# Patient Record
Sex: Female | Born: 1955 | Race: White | Hispanic: No | Smoking: Current every day smoker
Health system: Southern US, Community
[De-identification: ages and names within clinical notes are randomized; demographics above are authoritative.]

## PROBLEM LIST (undated history)

## (undated) DIAGNOSIS — M059 Rheumatoid arthritis with rheumatoid factor, unspecified: Secondary | ICD-10-CM

## (undated) DIAGNOSIS — K2950 Unspecified chronic gastritis without bleeding: Secondary | ICD-10-CM

## (undated) DIAGNOSIS — D75839 Thrombocytosis, unspecified: Secondary | ICD-10-CM

## (undated) DIAGNOSIS — K529 Noninfective gastroenteritis and colitis, unspecified: Secondary | ICD-10-CM

## (undated) DIAGNOSIS — D649 Anemia, unspecified: Secondary | ICD-10-CM

## (undated) DIAGNOSIS — I10 Essential (primary) hypertension: Secondary | ICD-10-CM

## (undated) DIAGNOSIS — E876 Hypokalemia: Secondary | ICD-10-CM

## (undated) DIAGNOSIS — E7800 Pure hypercholesterolemia, unspecified: Secondary | ICD-10-CM

## (undated) DIAGNOSIS — R918 Other nonspecific abnormal finding of lung field: Secondary | ICD-10-CM

## (undated) HISTORY — PX: APPENDECTOMY: SHX54

## (undated) HISTORY — PX: OVARIAN CYST SURGERY: SHX726

## (undated) HISTORY — PX: NASAL SINUS SURGERY: SHX719

## (undated) HISTORY — PX: CERVICAL CONE BIOPSY: SUR198

## (undated) HISTORY — PX: BREAST BIOPSY: SHX20

---

## 2004-07-04 ENCOUNTER — Emergency Department: Payer: Self-pay | Admitting: Emergency Medicine

## 2006-04-30 ENCOUNTER — Emergency Department: Payer: Self-pay | Admitting: Emergency Medicine

## 2006-05-13 ENCOUNTER — Emergency Department: Payer: Self-pay | Admitting: Emergency Medicine

## 2008-09-22 ENCOUNTER — Emergency Department: Payer: Self-pay | Admitting: Emergency Medicine

## 2009-05-27 ENCOUNTER — Ambulatory Visit: Payer: Self-pay | Admitting: Family Medicine

## 2010-08-13 ENCOUNTER — Emergency Department: Payer: Self-pay | Admitting: Emergency Medicine

## 2012-05-11 ENCOUNTER — Inpatient Hospital Stay: Payer: Self-pay | Admitting: Internal Medicine

## 2012-05-11 LAB — WBCS, STOOL

## 2012-05-11 LAB — CBC WITH DIFFERENTIAL/PLATELET
Basophil #: 0.1 10*3/uL (ref 0.0–0.1)
Basophil %: 0.5 %
HGB: 13.6 g/dL (ref 12.0–16.0)
Lymphocyte #: 1.2 10*3/uL (ref 1.0–3.6)
MCH: 32 pg (ref 26.0–34.0)
MCHC: 33.7 g/dL (ref 32.0–36.0)
Monocyte #: 1.2 x10 3/mm — ABNORMAL HIGH (ref 0.2–0.9)
Monocyte %: 5.4 %
Neutrophil %: 88.5 %
WBC: 22.5 10*3/uL — ABNORMAL HIGH (ref 3.6–11.0)

## 2012-05-11 LAB — COMPREHENSIVE METABOLIC PANEL
Bilirubin,Total: 0.3 mg/dL (ref 0.2–1.0)
Calcium, Total: 9.3 mg/dL (ref 8.5–10.1)
Chloride: 106 mmol/L (ref 98–107)
Co2: 27 mmol/L (ref 21–32)
Creatinine: 0.77 mg/dL (ref 0.60–1.30)
Osmolality: 284 (ref 275–301)
Potassium: 3.7 mmol/L (ref 3.5–5.1)
SGOT(AST): 35 U/L (ref 15–37)
SGPT (ALT): 22 U/L (ref 12–78)
Sodium: 143 mmol/L (ref 136–145)

## 2012-05-12 LAB — COMPREHENSIVE METABOLIC PANEL
Albumin: 2.8 g/dL — ABNORMAL LOW (ref 3.4–5.0)
Anion Gap: 5 — ABNORMAL LOW (ref 7–16)
BUN: 8 mg/dL (ref 7–18)
Bilirubin,Total: 0.5 mg/dL (ref 0.2–1.0)
Chloride: 106 mmol/L (ref 98–107)
Co2: 28 mmol/L (ref 21–32)
EGFR (African American): >60
EGFR (Non-African Amer.): >60
Glucose: 98 mg/dL (ref 65–99)
Potassium: 3.6 mmol/L (ref 3.5–5.1)
SGOT(AST): 18 U/L (ref 15–37)
SGPT (ALT): 13 U/L (ref 12–78)
Total Protein: 5.3 g/dL — ABNORMAL LOW (ref 6.4–8.2)

## 2012-05-12 LAB — CBC WITH DIFFERENTIAL/PLATELET
Basophil #: 0 10*3/uL (ref 0.0–0.1)
Eosinophil #: 0.1 10*3/uL (ref 0.0–0.7)
HCT: 30.7 % — ABNORMAL LOW (ref 35.0–47.0)
HGB: 10.3 g/dL — ABNORMAL LOW (ref 12.0–16.0)
Lymphocyte #: 1.8 10*3/uL (ref 1.0–3.6)
Lymphocyte %: 16 %
MCH: 32 pg (ref 26.0–34.0)
MCHC: 33.5 g/dL (ref 32.0–36.0)
MCV: 96 fL (ref 80–100)
Neutrophil #: 8.2 10*3/uL — ABNORMAL HIGH (ref 1.4–6.5)
Neutrophil %: 74.6 %
RBC: 3.21 10*6/uL — ABNORMAL LOW (ref 3.80–5.20)
RDW: 12.5 % (ref 11.5–14.5)

## 2012-05-13 LAB — CBC WITH DIFFERENTIAL/PLATELET
Basophil #: 0.1 10*3/uL (ref 0.0–0.1)
Eosinophil #: 0.2 10*3/uL (ref 0.0–0.7)
HCT: 28.8 % — ABNORMAL LOW (ref 35.0–47.0)
Lymphocyte #: 1.6 10*3/uL (ref 1.0–3.6)
MCH: 31.9 pg (ref 26.0–34.0)
MCHC: 33.4 g/dL (ref 32.0–36.0)
MCV: 96 fL (ref 80–100)
Monocyte #: 0.7 x10 3/mm (ref 0.2–0.9)
Neutrophil #: 8.7 10*3/uL — ABNORMAL HIGH (ref 1.4–6.5)
Platelet: 272 10*3/uL (ref 150–440)
RDW: 12 % (ref 11.5–14.5)
WBC: 11.3 10*3/uL — ABNORMAL HIGH (ref 3.6–11.0)

## 2012-05-14 LAB — CBC WITH DIFFERENTIAL/PLATELET
Basophil %: 0.2 %
Eosinophil #: 0.3 10*3/uL (ref 0.0–0.7)
Eosinophil %: 2.5 %
HCT: 30.3 % — ABNORMAL LOW (ref 35.0–47.0)
HGB: 9.9 g/dL — ABNORMAL LOW (ref 12.0–16.0)
Lymphocyte #: 1.3 10*3/uL (ref 1.0–3.6)
Lymphocyte %: 9.8 %
MCHC: 32.6 g/dL (ref 32.0–36.0)
MCV: 96 fL (ref 80–100)
Monocyte #: 0.8 x10 3/mm (ref 0.2–0.9)
Monocyte %: 6.4 %
Neutrophil %: 81.1 %
RBC: 3.17 10*6/uL — ABNORMAL LOW (ref 3.80–5.20)
WBC: 12.9 10*3/uL — ABNORMAL HIGH (ref 3.6–11.0)

## 2012-05-14 LAB — POTASSIUM: Potassium: 3 mmol/L — ABNORMAL LOW (ref 3.5–5.1)

## 2012-05-15 LAB — CBC WITH DIFFERENTIAL/PLATELET
Basophil #: 0 10*3/uL (ref 0.0–0.1)
Eosinophil %: 1.4 %
HCT: 30.8 % — ABNORMAL LOW (ref 35.0–47.0)
HGB: 10.7 g/dL — ABNORMAL LOW (ref 12.0–16.0)
Lymphocyte %: 6.2 %
MCH: 33.4 pg (ref 26.0–34.0)
Monocyte %: 6.5 %
Neutrophil #: 10.3 10*3/uL — ABNORMAL HIGH (ref 1.4–6.5)
RBC: 3.21 10*6/uL — ABNORMAL LOW (ref 3.80–5.20)
RDW: 12.2 % (ref 11.5–14.5)
WBC: 12 10*3/uL — ABNORMAL HIGH (ref 3.6–11.0)

## 2012-05-15 LAB — POTASSIUM: Potassium: 3.1 mmol/L — ABNORMAL LOW (ref 3.5–5.1)

## 2012-05-15 LAB — MAGNESIUM: Magnesium: 1.8 mg/dL

## 2012-05-16 LAB — CULTURE, BLOOD (SINGLE)

## 2012-05-16 LAB — PATHOLOGY REPORT

## 2012-06-03 ENCOUNTER — Ambulatory Visit: Payer: Self-pay | Admitting: Gastroenterology

## 2012-08-14 ENCOUNTER — Ambulatory Visit: Payer: Self-pay | Admitting: Gastroenterology

## 2014-01-06 ENCOUNTER — Inpatient Hospital Stay: Payer: Self-pay | Admitting: Internal Medicine

## 2014-01-06 LAB — CBC
HCT: 41.1 % (ref 35.0–47.0)
HGB: 13.8 g/dL (ref 12.0–16.0)
MCH: 31.9 pg (ref 26.0–34.0)
MCHC: 33.6 g/dL (ref 32.0–36.0)
MCV: 95 fL (ref 80–100)
Platelet: 423 10*3/uL (ref 150–440)
RBC: 4.33 10*6/uL (ref 3.80–5.20)
RDW: 12.8 % (ref 11.5–14.5)
WBC: 19.5 10*3/uL — ABNORMAL HIGH (ref 3.6–11.0)

## 2014-01-06 LAB — COMPREHENSIVE METABOLIC PANEL
ANION GAP: 7 (ref 7–16)
Albumin: 3.7 g/dL (ref 3.4–5.0)
Alkaline Phosphatase: 68 U/L
BUN: 15 mg/dL (ref 7–18)
Bilirubin,Total: 0.2 mg/dL (ref 0.2–1.0)
Calcium, Total: 8.7 mg/dL (ref 8.5–10.1)
Chloride: 108 mmol/L — ABNORMAL HIGH (ref 98–107)
Co2: 25 mmol/L (ref 21–32)
Creatinine: 0.87 mg/dL (ref 0.60–1.30)
EGFR (African American): >60
EGFR (Non-African Amer.): >60
GLUCOSE: 99 mg/dL (ref 65–99)
Osmolality: 280 (ref 275–301)
POTASSIUM: 3.8 mmol/L (ref 3.5–5.1)
SGOT(AST): 28 U/L (ref 15–37)
SGPT (ALT): 19 U/L (ref 12–78)
SODIUM: 140 mmol/L (ref 136–145)
TOTAL PROTEIN: 7.4 g/dL (ref 6.4–8.2)

## 2014-01-06 LAB — URINALYSIS, COMPLETE
BACTERIA: NONE SEEN
BLOOD: NEGATIVE
Bilirubin,UR: NEGATIVE
Glucose,UR: NEGATIVE mg/dL (ref 0–75)
Ketone: NEGATIVE
Nitrite: NEGATIVE
Ph: 5 (ref 4.5–8.0)
Protein: NEGATIVE
RBC,UR: 1 /HPF (ref 0–5)
SPECIFIC GRAVITY: 1.017 (ref 1.003–1.030)
Squamous Epithelial: 1
WBC UR: 2 /HPF (ref 0–5)

## 2014-01-06 LAB — LIPASE, BLOOD: LIPASE: 90 U/L (ref 73–393)

## 2014-01-07 LAB — BASIC METABOLIC PANEL
Anion Gap: 6 — ABNORMAL LOW (ref 7–16)
BUN: 11 mg/dL (ref 7–18)
CHLORIDE: 107 mmol/L (ref 98–107)
CO2: 26 mmol/L (ref 21–32)
Calcium, Total: 7.6 mg/dL — ABNORMAL LOW (ref 8.5–10.1)
Creatinine: 0.72 mg/dL (ref 0.60–1.30)
EGFR (African American): >60
Glucose: 87 mg/dL (ref 65–99)
Osmolality: 276 (ref 275–301)
POTASSIUM: 3.2 mmol/L — AB (ref 3.5–5.1)
Sodium: 139 mmol/L (ref 136–145)

## 2014-01-07 LAB — CBC WITH DIFFERENTIAL/PLATELET
Basophil #: 0 10*3/uL (ref 0.0–0.1)
Basophil %: 0.3 %
Eosinophil #: 0.2 10*3/uL (ref 0.0–0.7)
Eosinophil %: 2.1 %
HCT: 34.2 % — ABNORMAL LOW (ref 35.0–47.0)
HGB: 11.2 g/dL — ABNORMAL LOW (ref 12.0–16.0)
Lymphocyte #: 1.6 10*3/uL (ref 1.0–3.6)
Lymphocyte %: 15.5 %
MCH: 31.4 pg (ref 26.0–34.0)
MCHC: 32.8 g/dL (ref 32.0–36.0)
MCV: 96 fL (ref 80–100)
MONO ABS: 0.8 x10 3/mm (ref 0.2–0.9)
MONOS PCT: 8.2 %
NEUTROS ABS: 7.5 10*3/uL — AB (ref 1.4–6.5)
Neutrophil %: 73.9 %
PLATELETS: 300 10*3/uL (ref 150–440)
RBC: 3.57 10*6/uL — AB (ref 3.80–5.20)
RDW: 12.4 % (ref 11.5–14.5)
WBC: 10.2 10*3/uL (ref 3.6–11.0)

## 2014-03-16 DIAGNOSIS — Z720 Tobacco use: Secondary | ICD-10-CM | POA: Insufficient documentation

## 2014-10-12 NOTE — Consult Note (Signed)
Pt seen and examined. Full consult to follow. Acute onset of abd pain assoc with rectal bleeding. CT suggestive of ischemic colitis. No prior colonoscopies. Feeling better though abd still tender. Agree with liquid diet for now. Advance gradually as tolerated. Agree with Abx for now. If ischemic, expect sxs to gradually resolve. [facility] will [facility]. If sxs resolve, then will need outpt colonoscopy in 1 month with me as outpt. If sxs do not improve, then colonscopy while she is here in hospital. Thanks.  Electronic Signatures: Lutricia Feilh, Caspian Deleonardis (MD)  (Signed on [phone]-Nov-13 07:28)  Authored  Last Updated: 13-YQM-57: 18-Nov-13 07:28 by Lutricia Feilh, Jameika Kinn (MD)

## 2014-10-12 NOTE — H&P (Signed)
Subjective/Chief Complaint lower abd pain    History of Present Illness acute onset LQ abd pain, started at 0130 some dark color, no BRB nausea, no emesis near syncopal, sweaty, vagal? 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    + Tobacco Current (within 1 year)    Place of Living Home   Review of Systems:   Fever/Chills No    Cough No    Abdominal Pain Yes    Diarrhea Yes    Constipation No    Nausea/Vomiting Yes    SOB/DOE No    Chest Pain No    Dysuria No    Tolerating Diet Yes  Nauseated  hungry   Physical Exam:   GEN no acute distress    HEENT pink conjunctivae    NECK supple    RESP normal resp effort  clear BS    CARD regular rate    ABD positive tenderness  soft  no peritoneal signs    EXTR negative edema    SKIN normal to palpation    PSYCH alert, A+O to time, place, person, good insight   Lab Results: Hepatic:  17-Nov-13 05:43    Bilirubin, Total 0.3   Alkaline Phosphatase 85   SGPT (ALT) 22   SGOT (AST) 35   Total Protein, Serum 7.7   Albumin, Serum 4.4  Routine Micro:  17-Nov-13 05:43    Micro Text Report CLOS.DIFF ASSAY, RT-PCR   COMMENT                   NEGATIVE-CLOS.DIFFICILE TOXIN NOT DETECTED BY PCR   ANTIBIOTIC                        Clostridium Diff Toxin by RT-PCR NEGATIVE-CLOS.DIFFICILE TOXIN NOT DETECTED BY PCR ---------------------------------- Test procedure integrates sample purification, nucleic acid amplification, and detection of the target Clostridium difficile sequence in simple or complex samplesusing real-time PCR and RT-PCR assays.  Routine Chem:  17-Nov-13 05:43    Glucose, Serum  105   BUN 10   Creatinine (comp)  0.77   Sodium, Serum 143   Potassium, Serum 3.7   Chloride, Serum 106   CO2, Serum 27   Calcium (Total), Serum 9.3   Osmolality (calc) 284   eGFR (African American) >60   eGFR (Non-African American) >60 (eGFR values <58m/min/1.73 m2 may be an indication of chronic kidney disease (CKD). Calculated eGFR is useful in patients with stable renal function. The eGFR calculation will not be reliable in acutely ill patients when serum creatinine is changing rapidly. It is not useful in  patients on dialysis. The eGFR calculation may not be applicable to patients at the low and high extremes of body sizes, pregnant women, and vegetarians.)   Anion Gap 10  Routine Hem:  17-Nov-13 05:43    WBC (CBC)  22.5   RBC (CBC) 4.25   Hemoglobin (CBC) 13.6   Hematocrit (CBC) 40.4   Platelet Count (CBC)  493   MCV 95   MCH 32.0   MCHC 33.7   RDW 12.7   Neutrophil % 88.5   Lymphocyte % 5.4   Monocyte % 5.4  Eosinophil % 0.2   Basophil % 0.5   Neutrophil #  19.9   Lymphocyte # 1.2   Monocyte #  1.2   Eosinophil # 0.0   Basophil # 0.1 (Result(s) reported on 11 May 2012 at 06:13AM.)   Radiology Results: CT:    17-Nov-13 08:22, CT Abdomen and Pelvis With Contrast   CT Abdomen and Pelvis With Contrast   REASON FOR EXAM:    (1) LLQ pain; (2) LLQ pain  COMMENTS:       PROCEDURE: CT  - CT ABDOMEN / PELVIS  W  - May 11 2012  8:22AM     RESULT: History: Left lower quadrant pain    Comparison:  None    Technique: Multiple axial images of the abdomen and pelvis were performed   from the lung bases to the pubic symphysis, without p.o. contrast and   with 100 ml of Isovue 370 intravenous contrast.    Findings:  The lung bases are clear. There is no pneumothorax. The heart size is   normal.     The liver demonstrates no focal abnormality. There is no intrahepatic or   extrahepatic biliary ductal dilatation. The gallbladder is unremarkable.   The spleen demonstrates no focal abnormality. The  kidneys, adrenal   glands, and pancreas are normal. The bladder is unremarkable.     There is wall thickening involving the distal transverse colon and   descending colon with pericolonic inflammatory changes most concerning   for colitis which may be secondary to an infectious versus inflammatory   versus ischemic etiology. There is no pneumoperitoneum, pneumatosis, or   portal venous gas. There is no abdominal or pelvic free fluid. There is   no lymphadenopathy.   The abdominal aorta is normal in caliber. Knee superior mesenteric artery   is patent. The celiac artery is patent. The superior mesenteric vein is   patent.    The osseous structures are unremarkable.    IMPRESSION:     1. There is wall thickening involving the distal transverse colon and   descending colon with pericolonic inflammatory changes most concerning   for colitis which may be secondary to an infectious versus inflammatory   versus ischemic etiology.    Dictation Site: 1    Verified By: Jennette Banker, M.D., MD     Assessment/Admission Diagnosis colitis of unclear etiology admit hydrate will ask GI to see pt in am   Electronic Signatures: Florene Glen (MD)  (Signed [phone] 09:57)  Authored: CHIEF COMPLAINT and HISTORY, PAST MEDICAL/SURGIAL HISTORY, ALLERGIES, FAMILY AND SOCIAL HISTORY, REVIEW OF SYSTEMS, PHYSICAL EXAM, LABS, Radiology, ASSESSMENT AND PLAN   Last Updated: 17-Nov-13 09:57 by Florene Glen (MD)

## 2014-10-12 NOTE — Consult Note (Signed)
Overall feeling better. Pt with sigmoid diverticulosis, pedunculated sigmoid polyp that was removed, and signif ischemic colitis affecting descending and transverse colon. Multiple bx's taken. Full liquid diet and advance as tolerated.  Finish course of Abx. Thanks   Electronic Signatures: Lutricia Feilh, Sayer Masini (MD)  (Signed on 21-Nov-13 14:58)  Authored  Last Updated: 21-Nov-13 14:58 by Lutricia Feilh, Kenric Ginger (MD)

## 2014-10-12 NOTE — Consult Note (Signed)
PATIENT NAME:  Becky Ward, Becky Ward MR#:  [id number] DATE OF BIRTH:  04-30-1956  DATE OF CONSULTATION:  05/12/2012  REFERRING PHYSICIAN:   CONSULTING PHYSICIAN:  Ezzard StandingPaul Y. Bluford Kaufmannh, MD  REASON FOR REFERRAL: Acute colitis.   HISTORY OF PRESENT ILLNESS: The patient is a 59 year old white female with a known history of hypertension and tobacco use who woke up from sleep with acute lower abdominal pain followed by bouts of nausea and vomiting and then bloody diarrhea. In the emergency room, her white count was 22,000, she was afebrile. Decision was made to admit the patient with IV fluids and surgical consultation. The patient had something similar to this several months ago, but it was much less severe in nature. Other than that, her bowel movements are quite normal. She denied any prior history of rectal bleeding.   PAST MEDICAL HISTORY:  1. Hypertension. 2. Tobacco and alcohol use.  3. Appendectomy.  4. Vaginal cyst removal.   SOCIAL HISTORY: She smokes a pack a day and drinks four beers a day.   FAMILY HISTORY: History is notable for heart disease and leukemia.   ALLERGIES: She is allergic to codeine.   REVIEW OF SYSTEMS: No changes from the admission. Please refer to that.  HOME MEDICATIONS: Lisinopril.   PHYSICAL EXAMINATION:   GENERAL: The patient appears to be in no acute distress right now.   VITAL SIGNS: She is afebrile at this point. Her vital signs are normal at 109/69, although she was hypotensive when she was admitted.   HEENT: Normocephalic, atraumatic head. Pupils are equally reactive. Throat was clear.   NECK: Supple.   CARDIAC: Regular rhythm and rate without murmurs.   PULMONARY: Lungs are clear bilaterally.   ABDOMEN: Normoactive bowel sounds, soft. There is definite tenderness in the lower abdomen to palpation. There is no rebound or guarding. There is no hepatomegaly. She has active bowel sounds.   EXTREMITIES: No clubbing, cyanosis, or edema.   SKIN: Normal.    NEUROLOGICAL: Nonfocal.   RESULTS: CT scan showed thickened bowel wall in the distal transverse colon and descending colon with some inflammatory changes.   ASSESSMENT AND PLAN: This is a patient with acute colitis. It is likely to be ischemic in nature, especially with her tobacco use.  I agree with antibiotic coverage for now. She should be kept on a liquid diet for now until her cramping improves. If this is ischemic colitis, the symptoms should resolve gradually over the next few days. Advance the diet gradually as tolerated. The patient never had a colonoscopy in the past. The patient should have an outpatient colonoscopy in one month with me as an outpatient if symptoms resolve over the next few days. However, if they do not, then she will need a colonoscopy while she is here in the hospital. [facility], but will check back on the patient on Wednesday morning. Thank you for the referral. ____________________________ Ezzard StandingPaul Y. Bluford Kaufmannh, MD pyo:slb D: 05/12/2012 08:00:46 ET     T: 05/12/2012 08:25:11 ET        JOB#: [id number] cc: Ezzard StandingPaul Y. Bluford Kaufmannh, MD, <Dictator> Ezzard StandingPAUL Y Braeleigh Pyper MD ELECTRONICALLY SIGNED 05/12/2012 9:50

## 2014-10-12 NOTE — H&P (Signed)
PATIENT NAME:  Becky Ward, Becky Ward MR#:  [id number] DATE OF BIRTH:  April 23, 1956  DATE OF ADMISSION:  05/11/2012  CHIEF COMPLAINT: Lower abdominal pain.   HISTORY OF PRESENT ILLNESS: This is a patient with acute onset of lower abdominal pain started at 01:30 hours this morning. She has had some dark color to her loose stools but no frank bright red blood per rectum. She has never had an episode like this before. She denies fevers or chills but did have considerable sweatiness and a near syncopal episode suggesting a possible vagal reaction. Her pain is better now than it was but is still present. She was nauseated but has not had any vomiting.   PAST MEDICAL HISTORY: Hypertension.   PAST SURGICAL HISTORY: GYN surgery only.   SOCIAL HISTORY: Patient smokes tobacco, drinks alcohol on occasion. 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   REVIEW OF SYSTEMS: 10 system review is performed and negative with the exception of that mentioned in the history of present illness.   MEDICATIONS: Hydrochlorothiazide with lisinopril.   ALLERGIES: Codeine.   PHYSICAL EXAMINATION:  GENERAL: Healthy, comfortable-appearing Caucasian female patient in no acute distress.   VITAL SIGNS: Temperature 97.5, pulse 74, respirations 16, blood pressure 138/86, her blood pressure was as low as 76 on admission to the Emergency Room. Pain scale of 3. 98% room air sat. BMI of 21.   HEENT: No scleral icterus.   NECK: No palpable neck nodes.   CHEST: Clear to auscultation.   CARDIAC: Regular rate and rhythm.   ABDOMEN: Soft, nondistended. No guarding. No rebound. No percussion tenderness. Minimal tenderness in the left lower quadrant mostly. Pfannenstiel scar is well healed.   EXTREMITIES: Without are edema. Calves are nontender.   NEUROLOGIC: Grossly intact.   INTEGUMENT: No jaundice.   LABORATORY, DIAGNOSTIC AND RADIOLOGICAL DATA: Laboratory values demonstrate a white blood cell count of 22,000, hemoglobin and hematocrit of 13.6 and  40. Electrolytes are within normal limits. She is not acidotic with a CO2 of 27. CT scan is personally reviewed showing left-sided colitis with thickening.   Lactic acid is 1.1.   ASSESSMENT AND PLAN: This is a patient with probable colitis of unclear etiology. Her C. difficile toxin titer was negative but she had some dark color to her diarrhea. The etiology of this is not clear. I have recommended admission to the hospital, starting antibiotics. She has already received a dose of ciprofloxacin. I will start Levaquin daily and ask GI to see her. [facility] has been consulted and I have spoken directly to them but we will be the admitting physicians of record and they will follow this patient's course. Patient and family were in agreement with this plan.   ____________________________ Adah Salvageichard E. Excell Seltzerooper, MD rec:cms D: 05/11/2012 10:04:04 ET T: 05/11/2012 11:49:30 ET JOB#: [id number]  cc: Adah Salvageichard E. Excell Seltzerooper, MD, <Dictator> Lattie HawICHARD E Aarsh Fristoe MD ELECTRONICALLY SIGNED 05/11/2012 12:24

## 2014-10-12 NOTE — Discharge Summary (Signed)
PATIENT NAME:  Becky Ward, Becky Ward MR#:  [id number] DATE OF BIRTH:  1955/12/27  DATE OF ADMISSION:  05/11/2012 DATE OF DISCHARGE:  05/15/2012  ADMITTING DIAGNOSIS: Acute colitis.  DISCHARGE DIAGNOSES: 1. Acute suspected ischemic colitis status post colonoscopy on 05/15/2012 by Dr. Bluford Kaufmannh revealing diverticulosis of sigmoid colon, one medium polyp in sigmoid colon resected and retrieved, localized moderate inflammation was found in descending colon secondary to ischemic colitis, status post biopsy. Biopsy results are pending.  2. Lower gastrointestinal bleed due to colitis.  3. Acute posthemorrhagic anemia.  4. Hypotension due to gastrointestinal bleed.  5. Hypokalemia/hypomagnesemia.  DISCHARGE CONDITION: Stable.   DISCHARGE MEDICATIONS:  1. Flagyl 500 mg p.o. three times daily for 10 days - new medication. 2. Acetaminophen/oxycodone 325/5 mg 1 tablet every four hours as needed.  3. Levofloxacin 500 mg p.o. daily for 10 days.  4. Nicotine oral inhaler one every two hours as needed.  5. NicoDerm C-Q 14 mg/24 hour transdermal film, one patch transdermally once a day.   NOTE: The patient is not to take hydrochlorothiazide or lisinopril pill as well as Goody's headache powders.   DIET: Low residue, mechanical soft.  ACTIVITY: As tolerated.   DISCHARGE FOLLOWUP: Follow-up with Dr. Bluford Kaufmannh in one week after discharge and Dr. Lacie ScottsNiemeyer in two days after discharge.   CONSULTANTS:  1. Lutricia FeilPaul Oh, MD. 2. Ida Roguehristopher Lundquist, MD. 3. Dionne Miloichard Cooper, MD  RADIOLOGIC STUDIES: CT scan of the abdomen and pelvis with contrast, 05/11/2012, showed wall thickening involving distal transverse colon and descending colon with pericolonic inflammatory changes most concerning for colitis which may be secondary to infectious versus inflammatory versus ischemic etiology.  KUB done on 05/13/2012 showed nonobstructive bowel gas pattern.  HISTORY/HOSPITAL COURSE: The patient is a 59 year old Caucasian female with past  medical history significant for history of hypertension who presented to the hospital with complaints of abdominal pain, nausea, and vomiting as well as bloody diarrhea. Please refer to Dr. Wardell HeathSrikar Sudini's admission note on 05/11/2012. Apparently the patient was awakened with acute onset of lower abdominal pain which woke her up from sleep. She had multiple episodes of vomiting, unable to keep anything down.   On arrival to the Emergency Room, she had bloody diarrhea. She was noted to have leukocytosis to 2000. She was afebrile. She also had a presyncopal episode and was hypotensive in the Emergency Room. After 2 liters of IV normal saline solution, she is a little bit more comfortable.  Temperature was 97.5, pulse 71, and blood pressure 76/55; later after IV fluids, 130/90s. Saturation was 100% on room air. Physical exam revealed diffuse abdominal discomfort and tenderness in lower abdomen with no rigidity or guarding noted. No hepatosplenomegaly was also noted.   On arrival to the Emergency Room, 05/11/2012, laboratory data showed an elevated glucose to 105, otherwise BMP was unremarkable. The patient's liver enzymes were normal. The patient's white blood cell count was 22.5 thousand, hemoglobin 13.6, platelet count 493, and absolute neutrophil count was elevated to 19.9.  Blood cultures x2 taken on 05/11/2012 showed no growth. The patient's stool cultures taken the same day, 05/11/2012, showed no growth and no pathogens were noted, no white blood cells or red blood cells in stool, and was negative for C. difficile PCR.   The patient was admitted to the hospital. She had a CT scan of her abdomen done which revealed colitis. She was started on Levaquin as well as Flagyl IV as well as pain medications, IV fluids, and symptomatic therapy. With this therapy, she improved.  However, her improvement was somewhat slow. For this reason she underwent colonoscopy by Dr. Bluford Kaufmann which revealed diverticulosis as well as colon  polyps and ischemic mucosa. Dr. Bluford Kaufmann recommended to discharge the patient home and follow up with him in the next one week after discharge. The patient will be getting soft, low residue diet. If she tolerates this diet well, she is going to be discharged home.  On the day of discharge, her temperature is 98.2, pulse 69, respiratory rate 18, blood pressure 122/74, and saturation was 92% on 2 liters of oxygen through nasal cannula at rest. The patient will not be restarting her blood pressure medications at this point due to hypotensive episode in the hospital. The patient was noted to be anemic with rehydration and hemoglobin was found to be 10.7, on 05/15/2012. Anemia did not require transfusion.  The patient's white blood cell count improved to 12.0, on the day of discharge, 05/15/2012.  It is recommended to follow the patient's white blood cell count as an outpatient to make sure it normalizes. The patient was also noted to be hypokalemic as well as hypomagnesemic. Those elements were supplemented IV as well as p.o. It is recommended to recheck them as outpatient. The patient is being discharged in stable condition with the above-mentioned medications and follow-up.   TIME SPENT: 40 minutes. ____________________________ Katharina Caper, MD rv:slb D: 05/15/2012 17:28:22 ET T: 05/16/2012 12:05:49 ET JOB#: [id number]  cc: Katharina Caper, MD, <Dictator> Meindert A. Lacie Scotts, MD Katharina Caper MD ELECTRONICALLY SIGNED 05/23/2012 13:13

## 2014-10-12 NOTE — Consult Note (Signed)
Chief Complaint:   Subjective/Chief Complaint Still with abd cramping and diarrhea. No bleeding.   VITAL SIGNS/ANCILLARY NOTES: **Vital Signs.:   20-Nov-13 14:25   Vital Signs Type Routine   Temperature Temperature (F) 98.5   Celsius 36.9   Temperature Source Oral   Pulse Pulse 74   Respirations Respirations 20   Systolic BP Systolic BP 121   Diastolic BP (mmHg) Diastolic BP (mmHg) 80   Mean BP 93   Pulse Ox % Pulse Ox % 92   Pulse Ox Activity Level  At rest   Oxygen Delivery Room Air/ 21 %   Brief Assessment:   Cardiac Regular    Respiratory clear BS    Gastrointestinal LLQ abd tenderness   Assessment/Plan:  Assessment/Plan:   Assessment Colitis, prob ischemic. Discussed proceeding with colonoscopy for bx tomorrow. Pt agreed.    Plan Bowel prep tonight for colonoscopy tomorrow afternoon. Make sure K stays above 3.0. Thanks.   Electronic Signatures: Lutricia Feilh, Trinh Sanjose (MD)  (Signed [phone]-Nov-13 15:15)  Authored: Chief Complaint, VITAL SIGNS/ANCILLARY NOTES, Brief Assessment, Assessment/Plan   Last Updated: 20-Nov-13 15:15 by Lutricia Feilh, Syris Brookens (MD)

## 2014-10-12 NOTE — H&P (Signed)
PATIENT NAME:  Becky Ward, Becky Ward MR#:  [id number] DATE OF BIRTH:  July 19, 1955  DATE OF ADMISSION:  05/11/2012  PRIMARY CARE PHYSICIAN: Dr. Lacie Scotts   Consulting Physician - Dr. Excell Seltzer  Reason for consult -  Colitis, Sepsis  History obtained from patient and her brother at bedside. Old records reviewed. Case discussed with Dr. Clemens Catholic. CT scan and EKG reviewed personally.   CHIEF COMPLAINT: Abdominal pain, vomiting, and bloody diarrhea.   HISTORY OF PRESENTING ILLNESS:  59 year old female patient with history of hypertension and tobacco abuse who presented to the Emergency Room with acute onset of lower abdominal pain which woke her up from sleep. The patient had multiple episodes of vomiting, unable to keep anything down. On arrival to the Emergency Room here she had bloody diarrhea. The patient never had similar symptoms in the past. Leukocytosis of 22,000. Afebrile. The patient on the toilet had a presyncopal episode and was hypotensive on arrival to the Emergency Room, had 2 liters of IV normal saline bolus and is feeling significantly better. Dr. Excell Seltzer of surgery has been consulted by the ER who will see the patient.   The patient's pain is nonradiating, no aggravating or relieving factors. Associated with vomiting, bloody diarrhea. It is sharp and cramping.   PAST MEDICAL HISTORY:  1. Hypertension.  2. Tobacco abuse. 3. Alcohol abuse with four beers a day. The patient mentions that she did not drink alcohol for a few days one week prior and had no withdrawal symptoms.  4. Appendectomy.  5. Vaginal cyst removal.   SOCIAL HISTORY: Drinks four beers a day, smokes a pack a day. No illicit drugs.  Lives at home alone.   CODE STATUS: FULL CODE.   FAMILY HISTORY: Mother died of myocardial infarction, of  what patient mentions was a rupture of her heart. Dad died of leukemia. No premature coronary artery disease in the family.   ALLERGIES: Codeine, which causes drowsiness.   REVIEW OF  SYSTEMS: CONSTITUTIONAL: No fever. Complains of fatigue, weakness, and pain. EYES: No blurred vision, pain, or redness. ENT: No tinnitus, ear pain, or hearing loss. RESPIRATORY: No cough, wheeze, hemoptysis, dyspnea, or asthma. CARDIOVASCULAR: No chest pain, orthopnea, or edema. GI: Complains of nausea, vomiting, diarrhea, and rectal bleeding. No melena. GU: No dysuria, hematuria, or frequency. ENDOCRINE: No polyuria, nocturia, or thyroid problems. HEMATOLOGIC/LYMPHATIC: No anemia or easy bruising, but has rectal bleeding. INTEGUMENT: No acne, rash, or lesions. MUSCULOSKELETAL: No neck, back, shoulder, or knee pain. No arthritis. No gout. NEUROLOGIC: No focal numbness or weakness. No dysarthria or epilepsy.  PSYCH: No anxiety or depression.   HOME MEDICATIONS: Lisinopril/ hydrochlorothiazide 20/12.5, 1 tablet oral daily.   PHYSICAL EXAMINATION:  VITAL SIGNS: Temperature 97.5, blood pressure 76/55, presently at 130/87, pulse 71, respirations 16, saturating 100% on room air.   GENERAL: Moderately built Caucasian female patient lying in bed in significant pain, conversational.   PSYCHIATRIC: Alert, oriented times three. Mood and affect appropriate. Judgment intact.   HEENT: Atraumatic, normocephalic. Oral mucosa dry and pink. No oral ulcers or thrush. External ears and nose normal. No pallor. No icterus. Pupils bilaterally equal and reactive to light.   NECK: Supple. No thyromegaly. No palpable lymph nodes. Trachea midline. No carotid bruits or JVD.   CARDIOVASCULAR: S1, S2, regular rate and rhythm without any murmurs. Peripheral pulses 2+. No edema.   RESPIRATORY: Normal work of breathing. Clear to auscultation on both sides.   GI: Soft abdomen. Tenderness in the lower abdomen. No rigidity or guarding. Bowel sounds  present. No hepatosplenomegaly palpable.   SKIN: Warm and dry. No petechiae, rash, or ulcers.   MUSCULOSKELETAL: No joint swelling, redness, or effusion of the large joints. Normal  muscle tone.   LYMPHATIC: No cervical or inguinal lymphadenopathy.   NEUROLOGICAL: Motor strength 5/5 in upper and lower extremities. Sensation to fine touch intact all over.  LABORATORY, DIAGNOSTIC, AND RADIOLOGICAL DATA: Laboratory studies show glucose 105, BUN 10, creatinine 0.77, sodium 143, potassium 3.7. AST, ALT, alkaline phosphatase, bilirubin, and albumin normal. WBC 22.5, hemoglobin 13.6, platelets of 493, neutrophils 88% without any bands C. difficile negative.   EKG shows normal sinus rhythm. No acute ST elevation.   CT scan of the abdomen shows wall thickening involving the distal transverse colon and descending colon with pericolonic inflammatory changes concerning for colitis, infectious versus inflammatory versus ischemic etiology.   ASSESSMENT AND PLAN:  1. Acute colitis: Presumed to be infectious at this point with leukocytosis and hypotension. Cannot rule out ischemic colitis. Start on antibiotics and IV fluids. Monitor for anemia as the patient had some bloody diarrhea. We will consult gastroenterology and surgery for further input. The patient does have risk factors of hypertension and smoking for ischemic colitis. Blood cultures, stool cultures.  2. Sepsis: Secondary to above.  3. Tobacco abuse: The patient was counseled for more than three minutes regarding quitting smoking. I have also explained her family risk factors, hypertension. Offered a nicotine patch.  4. Alcohol abuse: The patient is high risk for withdrawal. The patient did not have any withdrawal last week when she did not drink as per the patient. We will have Ativan p.r.n. Start her on thiamine, folic acid, and closely monitor.  5. Hypertension: Well controlled hold medications at this time secondary to hypotension.  6. Deep vein thrombosis prophylaxis: With SCDs. No heparin products secondary to bloody diarrhea.   CODE STATUS: FULL CODE.   TIME SPENT: Time spent today on this case was 60 minutes with more  than 50% of the time spent in coordination of care.  ____________________________ Molinda BailiffSrikar R. Makaya Juneau, MD srs:bjt D: 05/11/2012 09:36:35 ET T: 05/11/2012 11:59:07 ET JOB#: [id number]  cc: Wardell HeathSrikar R. Tenishia Ekman, MD, <Dictator> Meindert A. Lacie ScottsNiemeyer, MD Adah Salvageichard E. Excell Seltzerooper, MD Wardell HeathSRIKAR [place] Bali Amylah Will MD ELECTRONICALLY SIGNED 05/11/2012 13:45

## 2014-10-16 NOTE — H&P (Signed)
PATIENT NAME:  Becky Ward, Becky Ward MR#:  [id number] DATE OF BIRTH:  1955-11-09  DATE OF ADMISSION:  01/06/2014  PRIMARY CARE PHYSICIAN: Dr. Lacie Scotts   REFERRING EMERGENCY ROOM PHYSICIAN: Dr. Janalyn Harder   CHIEF COMPLAINT: Abdominal cramp and nausea.   HISTORY OF PRESENTING ILLNESS: This is a 59 year old female with past history of hypertension, smoking, alcoholism, and had colitis 1-1/2 years ago. Came to Emergency Room as she had started having cramping abdominal pain, which is on and off since today morning. She also felt nauseous with that and she felt excessive sweating, felt dizzy, and 1 episode of almost sitting on the floor with that dizziness and had to stay on the floor for a few minutes before she could actually get up or call somebody so decided to come to Emergency Room.  At home, she did not have any episode of diarrhea, but once she came to the Emergency Room, she had a CAT scan with contrast to find out the reason and it showed there is colitis. After drinking the dye for the contrast and after having CAT scan, she had 4-5 times loose stool. She does not have any blood in her stool and she did not vomit at any time actually. Denies any fever. She has colonoscopy done 1-1/2 years ago with that previous episode which showed some polyp but no other findings.   REVIEW OF SYSTEMS:  CONSTITUTIONAL: Negative for fever, fatigue. Positive for generalized weakness. No weight loss or weight gain.  EYES: No blurring, double vision, discharge or redness.  EARS, NOSE, THROAT: No tinnitus, ear pain or hearing loss.  RESPIRATORY: No cough, wheezing, hemoptysis, or shortness of breath.  CARDIOVASCULAR: No chest pain, orthopnea, edema, arrhythmia, palpitations.  GASTROINTESTINAL: The patient had nausea and abdominal pain. No diarrhea or constipation. Had 4-5 loose stools after having oral contrast in Emergency Room.  GENITOURINARY: No dysuria, hematuria, or increased frequency.  ENDOCRINE: No heat or  cold intolerance. No excessive sweating or rashes.  MUSCULOSKELETAL: No pain or swelling in the joints.  NEUROLOGICAL: No numbness, weakness, tremor but feeling somewhat dizzy.  PSYCHIATRIC: Does not appear in any acute psychiatric illness at this time.  PAST MEDICAL HISTORY:  1. Hypertension.  2. Smoking.  3. Alcohol abuse with 4-5 beers a day.  4. Appendectomy.  5. Vaginal cyst removal.  SOCIAL HISTORY: Drinks 3-4 beers a day, sometimes 5, but not hard liquor. She smokes a pack per day. No illicit drug use. Lives at home with her daughter and granddaughter.   FAMILY HISTORY: Mother died of myocardial infarction and had rupture of her heart after that. Her father died of leukemia. No premature coronary artery disease in family.   HOME MEDICATIONS:  Hydrochlorothiazide and lisinopril 12.5/20 mg once a day.   VITAL SIGNS IN EMERGENCY ROOM:  Temperature 97.3, pulse is 57-60, blood pressure 120/84, respirations 20, and pulse oximetry 100% on room air.   PHYSICAL EXAMINATION: GENERAL: The patient is fully alert and oriented to time, place, and person. Does not appear in any acute distress.  HEENT: Head and neck atraumatic. Conjunctivae pink. Oral mucosa moist.  NECK: Supple. No JVD.  RESPIRATORY: Bilateral equal and clear air entry.  CARDIOVASCULAR: S1, S2 present, regular. No murmur.  ABDOMEN: Mild tenderness. Generalized bowel sounds present. No organomegaly.  SKIN: No acne, rashes, or lesions.  MUSCULOSKELETAL: No pain or swelling or tenderness in the joints.  NEUROLOGICAL: No numbness. Follows commands. Moves all 4 limbs. No tremor or rigidity.  PSYCHIATRIC: Does not appear in  any acute psychiatric illness at this time.   IMPORTANT LABORATORY RESULTS: Glucose 99, BUN 15, creatinine 0.87, sodium 140, potassium is 3.8, chloride is 10, CO2 of 25, lipase is 90. Total protein 7.4, bilirubin 0.2, alkaline phosphatase 68, SGOT 28, and SGPT 19. WBC 19.5, hemoglobin 13.8, platelet count  423,000, MCV is 95. Urinalysis is grossly negative, but 2+ leukocyte esterase.   ASSESSMENT AND PLAN: A 59 year old female came with abdominal cramping, nausea with sweating, and found having colitis on CAT scan.  1. Acute colitis. We will give her Cipro and Flagyl IV and we will keep her n.p.o. except medications. Give IV fluids and continue monitoring. Get cultures.  2. Leukocytosis, most likely as a reaction to above.  3. Hypertension. Because we are keeping n.p.o. and colitis, we will not give any medication. Blood pressure is stable.  4. Smoking. Tobacco abuse counseling done for 5 minutes to quit smoking. She agreed to use nicotine patch in the hospital.  5. Alcohol use, chronic. Drinking beer every day. We will keep her on monitoring. Currently, there are no signs of withdrawal.   TOTAL TIME SPENT ON THIS ADMISSION: 50 minutes.  ____________________________ Hope PigeonVaibhavkumar G. Elisabeth PigeonVachhani, MD vgv:dd D: 01/06/2014 17:03:43 ET T: 01/06/2014 17:48:07 ET JOB#: [id number]  cc: Hope PigeonVaibhavkumar G. Elisabeth PigeonVachhani, MD, <Dictator> Altamese DillingVAIBHAVKUMAR Allyanna Appleman MD ELECTRONICALLY SIGNED 01/07/2014 13:00

## 2014-10-16 NOTE — Discharge Summary (Signed)
PATIENT NAME:  Becky Ward, Becky Ward MR#:  [id number] DATE OF BIRTH:  1955/12/22  DATE OF ADMISSION:  01/06/2014 DATE OF DISCHARGE:  01/09/2014  ADMITTING PHYSICIAN: Dr. Elisabeth PigeonVachhani.   DISCHARGING PHYSICIAN: Enid Baasadhika [name], M.D.   PRIMARY MD: Dr. Lacie ScottsNiemeyer.  DISCHARGE DIAGNOSES:  1. Acute colitis.  2. Hypertension.  3. Tobacco use disorder.   DISCHARGE HOME MEDICATIONS: 1. Lisinopril-HCTZ 20/12.5 mg p.o. daily. 2. Flagyl 500 mg p.o. q.8 h. for 7 days.  3. Ciprofloxacin 500 mg p.o. b.i.d. for 7 days.  4. Tramadol 50 mg p.o. q.6 h. p.r.n. for pain.  5. Probiotic capsule twice a day for 7 days while on antibiotics.  6. Phenergan 25 mg q.8 h. p.r.n. for nausea and vomiting.   DISCHARGE DIET: Regular diet.   DISCHARGE ACTIVITY: As tolerated.    FOLLOWUP INSTRUCTIONS: PCP follow-up in 2 weeks.   LABORATORIES AND IMAGING STUDIES PRIOR TO DISCHARGE: WBC 10.2, hemoglobin 11.2, hematocrit 34.2, platelet count 300,000. Sodium 139, potassium 3.2, chloride 107, bicarbonate 26, BUN 11, creatinine 0.72, glucose 87 and calcium of 7.6.   CT of the abdomen and pelvis showing acute colitis throughout the descending colon, likely infectious, ischemic colitis cannot be ruled out. Bibasilar atelectasis noted.   BRIEF HOSPITAL COURSE: Becky Ward is a 59 year old, elderly Caucasian female, with no significant past medical history other than smoking and hypertension, comes to the hospital secondary to abdominal pain, nausea and diarrhea, and noted to have descending colitis.   1. Acute colitis, treated with Cipro and Flagyl, able to tolerate a regular diet at this time. Much improved pain and nausea. Will be discharged on Cipro and Flagyl p.o., and tramadol and Phenergan p.o. p.r.n., as well.  2. Hypertension. Blood pressure medication was held at the time of admission due to low-normal blood pressure, but now able to take p.o. medicines and blood pressure improved, so being restarted on her lisinopril-HCTZ.   3. Tobacco use disorder. Was on nicotine patch and Nicotrol inhaler in the hospital. She has been counseled against smoking. Her course has been otherwise uneventful in the hospital.   DISCHARGE CONDITION: Stable.   DISCHARGE DISPOSITION: Home.   TIME SPENT ON DISCHARGE: 40 minutes.     ____________________________ Enid Baasadhika Mukund Weinreb, MD rk:jr D: 01/09/2014 11:57:16 ET T: 01/09/2014 14:59:57 ET JOB#: [id number]  cc: Enid Baasadhika Ector Laurel, MD, <Dictator> Meindert A. Lacie ScottsNiemeyer, MD Enid BaasADHIKA Kalil Woessner MD ELECTRONICALLY SIGNED 01/13/2014 13:30

## 2015-02-12 ENCOUNTER — Encounter: Payer: Self-pay | Admitting: Emergency Medicine

## 2015-02-12 ENCOUNTER — Emergency Department
Admission: EM | Admit: 2015-02-12 | Discharge: 2015-02-13 | Disposition: A | Discharge status: Home / Self Care | Payer: No Typology Code available for payment source | Attending: Emergency Medicine | Admitting: Emergency Medicine

## 2015-02-12 DIAGNOSIS — R112 Nausea with vomiting, unspecified: Secondary | ICD-10-CM | POA: Insufficient documentation

## 2015-02-12 DIAGNOSIS — K529 Noninfective gastroenteritis and colitis, unspecified: Secondary | ICD-10-CM | POA: Insufficient documentation

## 2015-02-12 DIAGNOSIS — R197 Diarrhea, unspecified: Secondary | ICD-10-CM

## 2015-02-12 HISTORY — DX: Noninfective gastroenteritis and colitis, unspecified: K52.9

## 2015-02-12 LAB — CBC WITH DIFFERENTIAL/PLATELET
Basophils Absolute: 0 10*3/uL (ref 0–0.1)
Basophils Relative: 0 %
EOS ABS: 0 10*3/uL (ref 0–0.7)
Eosinophils Relative: 0 %
HCT: 42.2 % (ref 35.0–47.0)
HEMOGLOBIN: 13.8 g/dL (ref 12.0–16.0)
LYMPHS PCT: 6 %
Lymphs Abs: 1 10*3/uL (ref 1.0–3.6)
MCH: 31.3 pg (ref 26.0–34.0)
MCHC: 32.6 g/dL (ref 32.0–36.0)
MCV: 95.9 fL (ref 80.0–100.0)
Monocytes Absolute: 1 10*3/uL — ABNORMAL HIGH (ref 0.2–0.9)
Monocytes Relative: 6 %
NEUTROS PCT: 88 %
Neutro Abs: 16.4 10*3/uL — ABNORMAL HIGH (ref 1.4–6.5)
Platelets: 382 10*3/uL (ref 150–440)
RBC: 4.4 MIL/uL (ref 3.80–5.20)
RDW: 12.2 % (ref 11.5–14.5)
WBC: 18.5 10*3/uL — ABNORMAL HIGH (ref 3.6–11.0)

## 2015-02-12 LAB — COMPREHENSIVE METABOLIC PANEL
ALBUMIN: 4 g/dL (ref 3.5–5.0)
ALK PHOS: 54 U/L (ref 38–126)
ALT: 15 U/L (ref 14–54)
AST: 27 U/L (ref 15–41)
Anion gap: 10 (ref 5–15)
BUN: 15 mg/dL (ref 6–20)
CALCIUM: 8.8 mg/dL — AB (ref 8.9–10.3)
CO2: 24 mmol/L (ref 22–32)
Chloride: 106 mmol/L (ref 101–111)
Creatinine, Ser: 0.86 mg/dL (ref 0.44–1.00)
GFR calc non Af Amer: >60 mL/min (ref >60)
GLUCOSE: 135 mg/dL — AB (ref 65–99)
Potassium: 3.3 mmol/L — ABNORMAL LOW (ref 3.5–5.1)
SODIUM: 140 mmol/L (ref 135–145)
Total Bilirubin: <0.1 mg/dL — ABNORMAL LOW (ref 0.3–1.2)
Total Protein: 6.8 g/dL (ref 6.5–8.1)

## 2015-02-12 LAB — URINALYSIS COMPLETE WITH MICROSCOPIC (ARMC ONLY)
GLUCOSE, UA: NEGATIVE mg/dL
HGB URINE DIPSTICK: NEGATIVE
Nitrite: NEGATIVE
PH: 5 (ref 5.0–8.0)
Protein, ur: NEGATIVE mg/dL
SPECIFIC GRAVITY, URINE: 1.026 (ref 1.005–1.030)

## 2015-02-12 LAB — LIPASE, BLOOD: Lipase: 13 U/L — ABNORMAL LOW (ref 22–51)

## 2015-02-12 MED ORDER — MORPHINE SULFATE (PF) 4 MG/ML IV SOLN
4.0000 mg | Freq: Once | INTRAVENOUS | Status: AC
  Administered 2015-02-12: 4 mg via INTRAVENOUS
  Filled 2015-02-12: qty 1

## 2015-02-12 MED ORDER — DICYCLOMINE HCL 10 MG PO CAPS
10.0000 mg | ORAL_CAPSULE | Freq: Once | ORAL | Status: AC
  Administered 2015-02-12: 10 mg via ORAL
  Filled 2015-02-12: qty 1

## 2015-02-12 MED ORDER — DICYCLOMINE HCL 10 MG PO CAPS: 10.0000 mg | ORAL_CAPSULE | Freq: Four times a day (QID) | ORAL | Status: DC | PRN

## 2015-02-12 MED ORDER — ONDANSETRON HCL 4 MG PO TABS: ORAL_TABLET | ORAL | Status: DC

## 2015-02-12 MED ORDER — HYDROCODONE-ACETAMINOPHEN 5-325 MG PO TABS: 1.0000 | ORAL_TABLET | ORAL | Status: DC | PRN

## 2015-02-12 MED ORDER — METRONIDAZOLE 500 MG PO TABS
500.0000 mg | ORAL_TABLET | Freq: Once | ORAL | Status: AC
  Administered 2015-02-12: 500 mg via ORAL
  Filled 2015-02-12: qty 1

## 2015-02-12 MED ORDER — ONDANSETRON HCL 4 MG/2ML IJ SOLN
4.0000 mg | INTRAMUSCULAR | Status: AC
  Administered 2015-02-12: 4 mg via INTRAVENOUS
  Filled 2015-02-12: qty 2

## 2015-02-12 MED ORDER — CIPROFLOXACIN HCL 500 MG PO TABS: 500.0000 mg | ORAL_TABLET | Freq: Two times a day (BID) | ORAL | Status: AC

## 2015-02-12 MED ORDER — CIPROFLOXACIN HCL 500 MG PO TABS
500.0000 mg | ORAL_TABLET | ORAL | Status: AC
  Administered 2015-02-12: 500 mg via ORAL
  Filled 2015-02-12: qty 1

## 2015-02-12 MED ORDER — METRONIDAZOLE 500 MG PO TABS: 500.0000 mg | ORAL_TABLET | Freq: Three times a day (TID) | ORAL | Status: AC

## 2015-02-12 NOTE — Discharge Instructions (Signed)
As we discussed, you seemed to be having an episode of colitis similar to your episode last year.  We discussed you coming into the hospital versus going home, but we all agreed that at this time, based on your workup and vital signs, you should improve at home with appropriate medications.  However, as we also discussed, you should return immediately to the emergency department if your symptoms get worse or if he develop new symptoms that concern you, including but not limited to fever/chills, abdominal distention, worsening pain, or worsening of any of your other symptoms.  Please do not hesitate to come back if you feel you are not improving or getting worse.  Please follow up on Monday with your GI doctor and discuss your symptoms and schedule the next available follow-up appointment.  Colitis Colitis is inflammation of the colon. Colitis can be a short-term or long-standing (chronic) illness. Crohn's disease and ulcerative colitis are 2 types of colitis which are chronic. They usually require lifelong treatment. CAUSES  There are many different causes of colitis, including:  Viruses.  Germs (bacteria).  Medicine reactions. SYMPTOMS   Diarrhea.  Intestinal bleeding.  Pain.  Fever.  Throwing up (vomiting).  Tiredness (fatigue).  Weight loss.  Bowel blockage. DIAGNOSIS  The diagnosis of colitis is based on examination and stool or blood tests. X-rays, CT scan, and colonoscopy may also be needed. TREATMENT  Treatment may include:  Fluids given through the vein (intravenously).  Bowel rest (nothing to eat or drink for a period of time).  Medicine for pain and diarrhea.  Medicines (antibiotics) that kill germs.  Cortisone medicines.  Surgery. HOME CARE INSTRUCTIONS   Get plenty of rest.  Drink enough water and fluids to keep your urine clear or pale yellow.  Eat a well-balanced diet.  Call your caregiver for follow-up as recommended. SEEK IMMEDIATE MEDICAL CARE  IF:   You develop chills.  You have an oral temperature above 102 F (38.9 C), not controlled by medicine.  You have extreme weakness, fainting, or dehydration.  You have repeated vomiting.  You develop severe belly (abdominal) pain or are passing bloody or tarry stools. MAKE SURE YOU:   Understand these instructions.  Will watch your condition.  Will get help right away if you are not doing well or get worse. Document Released: 07/19/2004 Document Revised: 09/03/2011 Document Reviewed: 10/14/2009 [facility] Patient Information 2015 [place], [place]. This information is not intended to replace advice given to you by your health care provider. Make sure you discuss any questions you have with your health care provider.  Nausea and Vomiting Nausea is a sick feeling that often comes before throwing up (vomiting). Vomiting is a reflex where stomach contents come out of your mouth. Vomiting can cause severe loss of body fluids (dehydration). Children and elderly adults can become dehydrated quickly, especially if they also have diarrhea. Nausea and vomiting are symptoms of a condition or disease. It is important to find the cause of your symptoms. CAUSES   Direct irritation of the stomach lining. This irritation can result from increased acid production (gastroesophageal reflux disease), infection, food poisoning, taking certain medicines (such as nonsteroidal anti-inflammatory drugs), alcohol use, or tobacco use.  Signals from the brain.These signals could be caused by a headache, heat exposure, an inner ear disturbance, increased pressure in the brain from injury, infection, a tumor, or a concussion, pain, emotional stimulus, or metabolic problems.  An obstruction in the gastrointestinal tract (bowel obstruction).  Illnesses such as diabetes, hepatitis,  gallbladder problems, appendicitis, kidney problems, cancer, sepsis, atypical symptoms of a heart attack, or eating disorders.  Medical  treatments such as chemotherapy and radiation.  Receiving medicine that makes you sleep (general anesthetic) during surgery. DIAGNOSIS Your caregiver may ask for tests to be done if the problems do not improve after a few days. Tests may also be done if symptoms are severe or if the reason for the nausea and vomiting is not clear. Tests may include:  Urine tests.  Blood tests.  Stool tests.  Cultures (to look for evidence of infection).  X-rays or other imaging studies. Test results can help your caregiver make decisions about treatment or the need for additional tests. TREATMENT You need to stay well hydrated. Drink frequently but in small amounts.You may wish to drink water, sports drinks, clear broth, or eat frozen ice pops or gelatin dessert to help stay hydrated.When you eat, eating slowly may help prevent nausea.There are also some antinausea medicines that may help prevent nausea. HOME CARE INSTRUCTIONS   Take all medicine as directed by your caregiver.  If you do not have an appetite, do not force yourself to eat. However, you must continue to drink fluids.  If you have an appetite, eat a normal diet unless your caregiver tells you differently.  Eat a variety of complex carbohydrates (rice, wheat, potatoes, bread), lean meats, yogurt, fruits, and vegetables.  Avoid high-fat foods because they are more difficult to digest.  Drink enough water and fluids to keep your urine clear or pale yellow.  If you are dehydrated, ask your caregiver for specific rehydration instructions. Signs of dehydration may include:  Severe thirst.  Dry lips and mouth.  Dizziness.  Dark urine.  Decreasing urine frequency and amount.  Confusion.  Rapid breathing or pulse. SEEK IMMEDIATE MEDICAL CARE IF:   You have blood or brown flecks (like coffee grounds) in your vomit.  You have black or bloody stools.  You have a severe headache or stiff neck.  You are confused.  You have  severe abdominal pain.  You have chest pain or trouble breathing.  You do not urinate at least once every 8 hours.  You develop cold or clammy skin.  You continue to vomit for longer than 24 to 48 hours.  You have a fever. MAKE SURE YOU:   Understand these instructions.  Will watch your condition.  Will get help right away if you are not doing well or get worse. Document Released: 06/11/2005 Document Revised: 09/03/2011 Document Reviewed: 11/08/2010 [facility] Patient Information 2015 [place], [place]. This information is not intended to replace advice given to you by your health care provider. Make sure you discuss any questions you have with your health care provider.

## 2015-02-12 NOTE — ED Provider Notes (Signed)
[facility] Emergency Department Provider Note  ____________________________________________  Time seen: Approximately 9:46 PM  I have reviewed the triage vital signs and the nursing notes.   HISTORY  Chief Complaint Emesis    HPI Becky Ward is a 59 y.o. 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  She states that it feels just like it did in the past.  She has had at least 8 episodes of emesis and 4 episodes of diarrhea within 2 hours.  Initially she was not having any blood in her stool or her emesis, but since arriving at the emergency department she reports gross blood in the stool.  She denies fever/chills, chest pain, shortness of breath.  She states that they do not have a specific reason for her symptoms the last time but that it just got better with some medication.   Past Medical History  Diagnosis Date  . Colitis     There are no active problems to display for this patient.   History reviewed. No pertinent past surgical history.  Current Outpatient Rx  Name  Route  Sig  Dispense  Refill  . latanoprost (XALATAN) 0.005 % ophthalmic solution   Both Eyes   Place 2 drops into both eyes daily.         Marland Kitchen lisinopril-hydrochlorothiazide (PRINZIDE,ZESTORETIC) 20-12.5 MG per tablet   Oral   Take 1 tablet by mouth daily.         . ciprofloxacin (CIPRO) 500 MG tablet   Oral   Take 1 tablet (500 mg total) by mouth 2 (two) times daily.   20 tablet   0   . dicyclomine (BENTYL) 10 MG capsule   Oral   Take 1 capsule (10 mg total) by mouth 4 (four) times daily as needed for spasms (abdominal cramps).   20 capsule   0   . HYDROcodone-acetaminophen (NORCO/VICODIN) 5-325 MG per tablet   Oral   Take 1-2 tablets by mouth every 4 (four) hours as needed for moderate pain.   15 tablet   0   . metroNIDAZOLE  (FLAGYL) 500 MG tablet   Oral   Take 1 tablet (500 mg total) by mouth 3 (three) times daily.   30 tablet   0   . ondansetron (ZOFRAN) 4 MG tablet      Take 1-2 tabs by mouth every 8 hours as needed for nausea/vomiting   30 tablet   0     Allergies Banana and Codeine  History reviewed. No pertinent family history.  Social History Social History  Substance Use Topics  . Smoking status: Current Every Day Smoker  . Smokeless tobacco: None  . Alcohol Use: Yes     Comment: daily beer    Review of Systems Constitutional: No fever/chills Eyes: No visual changes. ENT: No sore throat. Cardiovascular: Denies chest pain. Respiratory: Denies shortness of breath. Gastrointestinal: abdominal cramping, severe, with multiple episodes of vomiting and diarrhea.    No constipation. Genitourinary: Negative for dysuria. Musculoskeletal: Negative for back pain. Skin: Negative for rash. Neurological: Negative for headaches, focal weakness or numbness.  10-point ROS otherwise negative.  ____________________________________________   PHYSICAL EXAM:  VITAL SIGNS: ED Triage Vitals  Enc Vitals Group     BP 02/12/15 1925 105/74 mmHg     Pulse Rate 02/12/15 1925 64     Resp 02/12/15 1925 18  Temp 02/12/15 1925 97.4 F (36.3 C)     Temp Source 02/12/15 1925 Oral     SpO2 02/12/15 1925 100 %     Weight 02/12/15 1925 110 lb (49.896 kg)     Height 02/12/15 1925  (1.626 m)     Head Cir --      Peak Flow --      Pain Score 02/12/15 1926 5     Pain Loc --      Pain Edu? --      Excl. in GC? --     Constitutional: Alert and oriented. Well appearing and in no acute distress. Eyes: Conjunctivae are normal. PERRL. EOMI. Head: Atraumatic. Nose: No congestion/rhinnorhea. Mouth/Throat: Mucous membranes are moist.  Oropharynx non-erythematous. Neck: No stridor.   Cardiovascular: Normal rate, regular rhythm. Grossly normal heart sounds.  Good peripheral circulation. Respiratory:  Normal respiratory effort.  No retractions. Lungs CTAB. Gastrointestinal: Soft and nontender. No distention. No abdominal bruits. No CVA tenderness.on rectal exam, her external hemorrhoids are very raw but without obvious blood.  On digital exam there is no gross blood but heme positive on Hemoccult with quality control passed. Musculoskeletal: No lower extremity tenderness nor edema.  No joint effusions. Neurologic:  Normal speech and language. No gross focal neurologic deficits are appreciated.  Skin:  Skin is warm, dry and intact. No rash noted. Psychiatric: Mood and affect are normal. Speech and behavior are normal.  ____________________________________________   LABS (all labs ordered are listed, but only abnormal results are displayed)  Labs Reviewed  CBC WITH DIFFERENTIAL/PLATELET - Abnormal; Notable for the following:    WBC 18.5 (*)    Neutro Abs 16.4 (*)    Monocytes Absolute 1.0 (*)    All other components within normal limits  COMPREHENSIVE METABOLIC PANEL - Abnormal; Notable for the following:    Potassium 3.3 (*)    Glucose, Bld 135 (*)    Calcium 8.8 (*)    Total Bilirubin <0.1 (*)    All other components within normal limits  LIPASE, BLOOD - Abnormal; Notable for the following:    Lipase 13 (*)    All other components within normal limits  URINALYSIS COMPLETEWITH MICROSCOPIC ([facility] ONLY) - Abnormal; Notable for the following:    Color, Urine AMBER (*)    APPearance CLEAR (*)    Bilirubin Urine 1+ (*)    Ketones, ur 1+ (*)    Leukocytes, UA TRACE (*)    Bacteria, UA RARE (*)    Squamous Epithelial / LPF 0-5 (*)    All other components within normal limits   ____________________________________________  EKG  Not indicated ____________________________________________  RADIOLOGY  Not indicated ____________________________________________   PROCEDURES  Procedure(s) performed: None  Critical Care performed:  No ____________________________________________   INITIAL IMPRESSION / ASSESSMENT AND PLAN / ED COURSE  Pertinent labs & imaging results that were available during my care of the patient were reviewed by me and considered in my medical decision making (see chart for details).  The patient has stable vital signs and is generally well-appearing.her labs are notable for a leukocytosis of 18.5, but this is consistent with her multiple episodes of vomiting and diarrhea.  I looked in the computer and one year ago she had a similar presentation and was found to have descending colitis and improved on Cipro and Flagyl.  I had an extensive discussion with the patient and she prefers to go home if possible.  I think this is appropriate and I do  not believe that there is a role for imaging at this time given that she is nontender to palpation.  I would anticipate that the results would be colitis.  As per our discussion I will treat her empirically with by mouth Cipro and Flagyl.  We will do a by mouth challenge and also provide some pain medication, both morphine by IV and Bentyl by mouth for the cramps.  It is notable that she reports that this always happens at moments of high stress in her life and she is currently undergoing some issues and her family that she believes that this off.  This raises suspicion in my mind of irritable bowel syndrome versus inflammatory bowel disease, but her primary care doctor is a gastroenterologist and she states that she has had multiple colonoscopies in the past which have all been unremarkable.  I will reassess her after the by mouth trial and medications.  ----------------------------------------- 11:51 PM on 02/12/2015 -----------------------------------------  I reassessed the patient and she is feeling better, though still "not great".  She still prefers to go home.  Again, she has no abdominal tenderness to palpation.  I do not feel that a CT scan would be helpful  for her.  I am going to give her a dose of Klor-Con for her hypokalemia and prescriptions for Bentyl, Norco, Zofran, Cipro, and Flagyl.  Her friend and her brother are both present at the bedside, and I had an extensive discussion with all of them about strict return precautions if she is not improving, if she gets worse, if she is not able to tolerate by mouth intake, etc.  They all understand the plan.  ____________________________________________  FINAL CLINICAL IMPRESSION(S) / ED DIAGNOSES  Final diagnoses:  Non-intractable vomiting with nausea, vomiting of unspecified type  Diarrhea  Colitis      NEW MEDICATIONS STARTED DURING THIS VISIT:  New Prescriptions   CIPROFLOXACIN (CIPRO) 500 MG TABLET    Take 1 tablet (500 mg total) by mouth 2 (two) times daily.   DICYCLOMINE (BENTYL) 10 MG CAPSULE    Take 1 capsule (10 mg total) by mouth 4 (four) times daily as needed for spasms (abdominal cramps).   HYDROCODONE-ACETAMINOPHEN (NORCO/VICODIN) 5-325 MG PER TABLET    Take 1-2 tablets by mouth every 4 (four) hours as needed for moderate pain.   METRONIDAZOLE (FLAGYL) 500 MG TABLET    Take 1 tablet (500 mg total) by mouth 3 (three) times daily.   ONDANSETRON (ZOFRAN) 4 MG TABLET    Take 1-2 tabs by mouth every 8 hours as needed for nausea/vomiting     Loleta Rose, MD 02/13/15 0000

## 2015-02-12 NOTE — ED Notes (Signed)
Pt via POV with c/o of "colitis". Pt reports Nausea, Vomiting x 8 and Diarrhea x 4 in the last 2 hours.  Pt denies bloody stool or emesis.

## 2015-02-12 NOTE — ED Notes (Signed)
Pt drinking ginger ale  

## 2016-06-24 ENCOUNTER — Encounter: Payer: Self-pay | Admitting: Emergency Medicine

## 2016-06-24 ENCOUNTER — Emergency Department: Payer: Self-pay

## 2016-06-24 ENCOUNTER — Emergency Department
Admission: EM | Admit: 2016-06-24 | Discharge: 2016-06-24 | Disposition: A | Discharge status: Home / Self Care | Payer: Self-pay | Attending: Emergency Medicine | Admitting: Emergency Medicine

## 2016-06-24 DIAGNOSIS — Z79899 Other long term (current) drug therapy: Secondary | ICD-10-CM | POA: Insufficient documentation

## 2016-06-24 DIAGNOSIS — R109 Unspecified abdominal pain: Secondary | ICD-10-CM

## 2016-06-24 DIAGNOSIS — K529 Noninfective gastroenteritis and colitis, unspecified: Secondary | ICD-10-CM | POA: Insufficient documentation

## 2016-06-24 DIAGNOSIS — R1084 Generalized abdominal pain: Secondary | ICD-10-CM

## 2016-06-24 DIAGNOSIS — F17200 Nicotine dependence, unspecified, uncomplicated: Secondary | ICD-10-CM | POA: Insufficient documentation

## 2016-06-24 DIAGNOSIS — I10 Essential (primary) hypertension: Secondary | ICD-10-CM | POA: Insufficient documentation

## 2016-06-24 HISTORY — DX: Essential (primary) hypertension: I10

## 2016-06-24 LAB — COMPREHENSIVE METABOLIC PANEL
ALK PHOS: 66 U/L (ref 38–126)
ALT: 18 U/L (ref 14–54)
ANION GAP: 9 (ref 5–15)
AST: 37 U/L (ref 15–41)
Albumin: 4.4 g/dL (ref 3.5–5.0)
BILIRUBIN TOTAL: 0.2 mg/dL — AB (ref 0.3–1.2)
BUN: 15 mg/dL (ref 6–20)
CALCIUM: 9.8 mg/dL (ref 8.9–10.3)
CO2: 29 mmol/L (ref 22–32)
Chloride: 105 mmol/L (ref 101–111)
Creatinine, Ser: 0.78 mg/dL (ref 0.44–1.00)
GFR calc non Af Amer: >60 mL/min (ref >60)
GLUCOSE: 143 mg/dL — AB (ref 65–99)
Potassium: 3.5 mmol/L (ref 3.5–5.1)
Sodium: 143 mmol/L (ref 135–145)
TOTAL PROTEIN: 7.6 g/dL (ref 6.5–8.1)

## 2016-06-24 LAB — CBC
HCT: 39.8 % (ref 35.0–47.0)
HEMOGLOBIN: 13.6 g/dL (ref 12.0–16.0)
MCH: 32.3 pg (ref 26.0–34.0)
MCHC: 34.3 g/dL (ref 32.0–36.0)
MCV: 94.1 fL (ref 80.0–100.0)
Platelets: 492 10*3/uL — ABNORMAL HIGH (ref 150–440)
RBC: 4.22 MIL/uL (ref 3.80–5.20)
RDW: 12.5 % (ref 11.5–14.5)
WBC: 20.6 10*3/uL — ABNORMAL HIGH (ref 3.6–11.0)

## 2016-06-24 LAB — LIPASE, BLOOD: Lipase: 21 U/L (ref 11–51)

## 2016-06-24 MED ORDER — CIPROFLOXACIN IN D5W 400 MG/200ML IV SOLN
400.0000 mg | Freq: Once | INTRAVENOUS | Status: AC
  Administered 2016-06-24: 400 mg via INTRAVENOUS
  Filled 2016-06-24: qty 200

## 2016-06-24 MED ORDER — MORPHINE SULFATE (PF) 4 MG/ML IV SOLN
4.0000 mg | Freq: Once | INTRAVENOUS | Status: AC
  Administered 2016-06-24: 4 mg via INTRAVENOUS
  Filled 2016-06-24: qty 1

## 2016-06-24 MED ORDER — IOPAMIDOL (ISOVUE-370) INJECTION 76%
100.0000 mL | Freq: Once | INTRAVENOUS | Status: AC | PRN
  Administered 2016-06-24: 100 mL via INTRAVENOUS

## 2016-06-24 MED ORDER — CIPROFLOXACIN HCL 500 MG PO TABS: 500.0000 mg | ORAL_TABLET | Freq: Two times a day (BID) | ORAL | 0 refills | Status: AC

## 2016-06-24 MED ORDER — OXYCODONE-ACETAMINOPHEN 5-325 MG PO TABS: 1.0000 | ORAL_TABLET | Freq: Four times a day (QID) | ORAL | 0 refills | Status: DC | PRN

## 2016-06-24 MED ORDER — ONDANSETRON HCL 4 MG/2ML IJ SOLN
4.0000 mg | Freq: Once | INTRAMUSCULAR | Status: AC | PRN
  Administered 2016-06-24: 4 mg via INTRAVENOUS

## 2016-06-24 MED ORDER — METRONIDAZOLE 500 MG PO TABS: 500.0000 mg | ORAL_TABLET | Freq: Two times a day (BID) | ORAL | 0 refills | Status: AC

## 2016-06-24 MED ORDER — ONDANSETRON HCL 4 MG/2ML IJ SOLN
INTRAMUSCULAR | Status: AC
  Administered 2016-06-24: 4 mg via INTRAVENOUS
  Filled 2016-06-24: qty 2

## 2016-06-24 MED ORDER — SODIUM CHLORIDE 0.9 % IV BOLUS (SEPSIS)
1000.0000 mL | Freq: Once | INTRAVENOUS | Status: AC
  Administered 2016-06-24: 1000 mL via INTRAVENOUS

## 2016-06-24 MED ORDER — METRONIDAZOLE IN NACL 5-0.79 MG/ML-% IV SOLN
500.0000 mg | Freq: Once | INTRAVENOUS | Status: AC
  Administered 2016-06-24: 500 mg via INTRAVENOUS
  Filled 2016-06-24: qty 100

## 2016-06-24 NOTE — ED Triage Notes (Signed)
Pt states she has "colitis" and has an attack on average once a year.  She is bent over and moaning in pain.  She is c/o n/v but no diarrhea and says her pain is 8/10.

## 2016-06-24 NOTE — ED Notes (Signed)
Pt. States hx of colitis.  Pt. States she was dx 3 years ago.  Pt. States she gets flare up about once a year.

## 2016-06-24 NOTE — ED Provider Notes (Signed)
[facility] Emergency Department Provider Note   ____________________________________________   First MD Initiated Contact with Patient 06/24/16 [phone]     (approximate)  I have reviewed the triage vital signs and the nursing notes.   HISTORY  Chief Complaint Abdominal Pain and Emesis    HPI Becky Ward is a 60 y.o. female who comes into the hospital today with abdominal pain. The patient reports that she's been having cold sweats and vomiting. The patient reports that she woke up around 3 AM with some lower abdominal pain. She started vomiting and it looked like which ED for dinner as well as yellow. The patient reports that she started having some diarrhea here. She denies fevers but has had some chills at home. She checked her blood pressure and it was 68/56 she decided to come in to the hospital. The patient did not take anything for pain.The patient denies any pain with urination, chest pain or shortness of breath. She drinks 3-4 beers daily.   Past Medical History:  Diagnosis Date  . Colitis   . Hypertension     There are no active problems to display for this patient.   Past Surgical History:  Procedure Laterality Date  . NASAL SINUS SURGERY      Prior to Admission medications   Medication Sig Start Date End Date Taking? Authorizing Provider  latanoprost (XALATAN) 0.005 % ophthalmic solution Place 1 drop into both eyes at bedtime.  02/08/15  Yes Historical Provider, MD  lisinopril-hydrochlorothiazide (PRINZIDE,ZESTORETIC) 20-12.5 MG per tablet Take 1 tablet by mouth daily. 01/28/15  Yes Historical Provider, MD  ciprofloxacin (CIPRO) 500 MG tablet Take 1 tablet (500 mg total) by mouth 2 (two) times daily. 06/24/16 07/04/16  Rebecka ApleyAllison P Elian Gloster, MD  HYDROcodone-acetaminophen (NORCO/VICODIN) 5-325 MG per tablet Take 1-2 tablets by mouth every 4 (four) hours as needed for moderate pain. Patient not taking: Reported on 06/24/2016 02/12/15   Loleta Roseory  Forbach, MD  metroNIDAZOLE (FLAGYL) 500 MG tablet Take 1 tablet (500 mg total) by mouth 2 (two) times daily. 06/24/16 07/01/16  Rebecka ApleyAllison P Chaya Dehaan, MD  ondansetron (ZOFRAN) 4 MG tablet Take 1-2 tabs by mouth every 8 hours as needed for nausea/vomiting Patient not taking: Reported on 06/24/2016 02/12/15   Loleta Roseory Forbach, MD  oxyCODONE-acetaminophen (ROXICET) 5-325 MG tablet Take 1 tablet by mouth every 6 (six) hours as needed. 06/24/16   Rebecka ApleyAllison P Clif Serio, MD    Allergies Banana and Codeine  No family history on file.  Social History Social History  Substance Use Topics  . Smoking status: Current Every Day Smoker  . Smokeless tobacco: Never Used  . Alcohol use Yes     Comment: daily beer    Review of Systems Constitutional: chills Eyes: No visual changes. ENT: No sore throat. Cardiovascular: Denies chest pain. Respiratory: Denies shortness of breath. Gastrointestinal: abdominal pain, nausea, vomiting.  No diarrhea.  No constipation. Genitourinary: Negative for dysuria. Musculoskeletal: Negative for back pain. Skin: Negative for rash. Neurological: Negative for headaches, focal weakness or numbness.  10-point ROS otherwise negative.  ____________________________________________   PHYSICAL EXAM:  VITAL SIGNS: ED Triage Vitals  Enc Vitals Group     BP 06/24/16 0501 (!) 88/74     Pulse Rate 06/24/16 0501 (!) 57     Resp 06/24/16 0501 18     Temp 06/24/16 0501 97.6 F (36.4 C)     Temp src --      SpO2 06/24/16 0501 100 %     Weight  06/24/16 0509 108 lb (49 kg)     Height 06/24/16 0509 5\' 4"  (1.626 m)     Head Circumference --      Peak Flow --      Pain Score --      Pain Loc --      Pain Edu? --      Excl. in GC? --     Constitutional: Alert and oriented. Well appearing and in Moderate distress. Eyes: Conjunctivae are normal. PERRL. EOMI. Head: Atraumatic. Nose: No congestion/rhinnorhea. Mouth/Throat: Mucous membranes are moist.  Oropharynx  non-erythematous. Cardiovascular: Normal rate, regular rhythm. Grossly normal heart sounds.  Good peripheral circulation. Respiratory: Normal respiratory effort.  No retractions. Lungs CTAB. Gastrointestinal: Soft with some diffuse tenderness to palpation. No distention. Decreased bowel sounds Musculoskeletal: No lower extremity tenderness nor edema.  Neurologic:  Normal speech and language.  Skin:  Skin is warm, dry and intact. Marland Kitchen. Psychiatric: Mood and affect are normal.   ____________________________________________   LABS (all labs ordered are listed, but only abnormal results are displayed)  Labs Reviewed  COMPREHENSIVE METABOLIC PANEL - Abnormal; Notable for the following:       Result Value   Glucose, Bld 143 (*)    Total Bilirubin 0.2 (*)    All other components within normal limits  CBC - Abnormal; Notable for the following:    WBC 20.6 (*)    Platelets 492 (*)    All other components within normal limits  LIPASE, BLOOD  URINALYSIS, COMPLETE (UACMP) WITH MICROSCOPIC   ____________________________________________  EKG  none ____________________________________________  RADIOLOGY  CT abdomen and pelvis ____________________________________________   PROCEDURES  Procedure(s) performed: None  Procedures  Critical Care performed: No  ____________________________________________   INITIAL IMPRESSION / ASSESSMENT AND PLAN / ED COURSE  Pertinent labs & imaging results that were available during my care of the patient were reviewed by me and considered in my medical decision making (see chart for details).  This is a 60 year old female who comes into the hospital today with abdominal pain. She reports she is also had some vomiting and some diarrhea here. The patient does have a history of colitis. I will send the patient for a CT scan of her abdomen and pelvis as she does have an elevated white blood cell count. I will give the patient dose of morphine as well as  liter of normal saline. She will be reassessed once I received the results of her imaging studies.  Clinical Course as of Jun 24 849  Sun Jun 24, 2016  [id number] VASCULAR  No evidence of significant atherosclerosis or mesenteric arterial occlusive disease. No reason to suspect ischemic etiology for colitis based on CTA findings.  NON-VASCULAR  Significant segmental colitis involving the colon from the level of the mid transverse to lower descending colon. No evidence of associated obstruction, perforation, abscess or visible fistula.    [AW]    Clinical Course User Index [AW] Rebecka ApleyAllison P Giavonni Cizek, MD   The patient does have some colitis. I will give her some ciprofloxacin and Flagyl. She will be reassessed by Dr. Roxan Hockeyobinson and if she is able to keep down fluid and her pain is controlled she'll be discharged home.  ____________________________________________   FINAL CLINICAL IMPRESSION(S) / ED DIAGNOSES  Final diagnoses:  Generalized abdominal pain  Abdominal pain, unspecified abdominal location  Colitis      NEW MEDICATIONS STARTED DURING THIS VISIT:  New Prescriptions   CIPROFLOXACIN (CIPRO) 500 MG TABLET    Take 1  tablet (500 mg total) by mouth 2 (two) times daily.   METRONIDAZOLE (FLAGYL) 500 MG TABLET    Take 1 tablet (500 mg total) by mouth 2 (two) times daily.   OXYCODONE-ACETAMINOPHEN (ROXICET) 5-325 MG TABLET    Take 1 tablet by mouth every 6 (six) hours as needed.     Note:  This document was prepared using Dragon voice recognition software and may include unintentional dictation errors.    Rebecka Apley, MD 06/24/16 [phone]

## 2016-06-24 NOTE — ED Provider Notes (Signed)
Patient received in sign-out from Dr. Zenda AlpersWebster.  Workup and evaluation pending abx and PO challenge for acute colitis.  Patient was able to tolerate PO and was able to ambulate with a steady gait.  Have discussed with the patient and available family all diagnostics and treatments performed thus far and all questions were answered to the best of my ability. The patient demonstrates understanding and agreement with plan. Willy Eddy.      Akoni Parton, MD 06/24/16 [phone]

## 2016-06-24 NOTE — ED Notes (Signed)
Lab called to report, not enough urine sent for urinalysis.

## 2016-06-24 NOTE — Discharge Instructions (Signed)
Please follow-up with your primary care physician. If the symptoms worsen or he develop any vomiting please return to the emergency department.

## 2016-06-24 NOTE — ED Notes (Signed)
Pt assisted out of car to wheelchair; c/o abd pain and vomiting; says her PB at home was 78/56; pt bent over in wheelchair, moaning

## 2017-07-26 DIAGNOSIS — R911 Solitary pulmonary nodule: Secondary | ICD-10-CM | POA: Insufficient documentation

## 2018-09-01 DIAGNOSIS — D649 Anemia, unspecified: Secondary | ICD-10-CM | POA: Insufficient documentation

## 2018-09-01 DIAGNOSIS — E7800 Pure hypercholesterolemia, unspecified: Secondary | ICD-10-CM | POA: Insufficient documentation

## 2018-09-01 DIAGNOSIS — R7989 Other specified abnormal findings of blood chemistry: Secondary | ICD-10-CM | POA: Insufficient documentation

## 2019-04-17 HISTORY — PX: COLONOSCOPY WITH ESOPHAGOGASTRODUODENOSCOPY (EGD): SHX5779

## 2019-04-23 DIAGNOSIS — K2950 Unspecified chronic gastritis without bleeding: Secondary | ICD-10-CM | POA: Insufficient documentation

## 2019-12-04 ENCOUNTER — Other Ambulatory Visit: Payer: Self-pay

## 2019-12-04 ENCOUNTER — Emergency Department
Admission: EM | Admit: 2019-12-04 | Discharge: 2019-12-04 | Disposition: A | Discharge status: Home / Self Care | Payer: No Typology Code available for payment source | Attending: Emergency Medicine | Admitting: Emergency Medicine

## 2019-12-04 ENCOUNTER — Encounter: Payer: Self-pay | Admitting: Emergency Medicine

## 2019-12-04 DIAGNOSIS — M5431 Sciatica, right side: Secondary | ICD-10-CM | POA: Insufficient documentation

## 2019-12-04 DIAGNOSIS — F17210 Nicotine dependence, cigarettes, uncomplicated: Secondary | ICD-10-CM | POA: Insufficient documentation

## 2019-12-04 DIAGNOSIS — I10 Essential (primary) hypertension: Secondary | ICD-10-CM | POA: Insufficient documentation

## 2019-12-04 MED ORDER — METHYLPREDNISOLONE 4 MG PO TBPK
ORAL_TABLET | ORAL | 0 refills | Status: DC
Start: 2019-12-04 — End: 2020-02-03

## 2019-12-04 MED ORDER — KETOROLAC TROMETHAMINE 30 MG/ML IJ SOLN
30.0000 mg | Freq: Once | INTRAMUSCULAR | Status: DC
  Filled 2019-12-04: qty 1

## 2019-12-04 MED ORDER — ORPHENADRINE CITRATE 30 MG/ML IJ SOLN
60.0000 mg | Freq: Two times a day (BID) | INTRAMUSCULAR | Status: DC
  Administered 2019-12-04: 60 mg via INTRAMUSCULAR
  Filled 2019-12-04: qty 2

## 2019-12-04 MED ORDER — TRAMADOL HCL 50 MG PO TABS: 50.0000 mg | ORAL_TABLET | Freq: Four times a day (QID) | ORAL | 0 refills | Status: DC | PRN

## 2019-12-04 MED ORDER — CYCLOBENZAPRINE HCL 10 MG PO TABS: 10.0000 mg | ORAL_TABLET | Freq: Three times a day (TID) | ORAL | 0 refills | Status: DC | PRN

## 2019-12-04 MED ORDER — KETOROLAC TROMETHAMINE 60 MG/2ML IM SOLN
30.0000 mg | Freq: Once | INTRAMUSCULAR | Status: AC
  Administered 2019-12-04: 30 mg via INTRAMUSCULAR

## 2019-12-04 MED ORDER — HYDROMORPHONE HCL 1 MG/ML IJ SOLN
1.0000 mg | Freq: Once | INTRAMUSCULAR | Status: AC
  Administered 2019-12-04: 1 mg via INTRAMUSCULAR
  Filled 2019-12-04: qty 1

## 2019-12-04 NOTE — Discharge Instructions (Signed)
Follow discharge care instruction take medication as directed. °

## 2019-12-04 NOTE — ED Provider Notes (Signed)
[facility] Emergency Department Provider Note   ____________________________________________   First MD Initiated Contact with Patient 12/04/19 [phone]     (approximate)  I have reviewed the triage vital signs and the nursing notes.   HISTORY  Chief Complaint Leg Pain    HPI Becky Ward is a 64 y.o. female patient complain of 3 days of radicular back pain radiating to the right buttocks.  Patient state no known injury.  Patient states 2 [place] the [place] but states no falls but exerting herself more than usual.  Patient denies bladder or bowel dysfunction.  Patient states she tried to see her PCP predicted I have a scheduled appointment to the 17th of this month.  Rates her pain as a 10/10.  Describes pain as "sharp/achy".  No palliative measures for complaint.         Past Medical History:  Diagnosis Date  . Colitis   . Hypertension     There are no problems to display for this patient.   Past Surgical History:  Procedure Laterality Date  . NASAL SINUS SURGERY      Prior to Admission medications   Medication Sig Start Date End Date Taking? Authorizing Provider  cyclobenzaprine (FLEXERIL) 10 MG tablet Take 1 tablet (10 mg total) by mouth 3 (three) times daily as needed. 12/04/19   Sable Feil, PA-C  latanoprost (XALATAN) 0.005 % ophthalmic solution Place 1 drop into both eyes at bedtime.  02/08/15   [provider]  lisinopril-hydrochlorothiazide (PRINZIDE,ZESTORETIC) 20-12.5 MG per tablet Take 1 tablet by mouth daily. 01/28/15   [provider]  methylPREDNISolone (MEDROL DOSEPAK) 4 MG TBPK tablet Take Tapered dose as directed 12/04/19   Sable Feil, PA-C  traMADol (ULTRAM) 50 MG tablet Take 1 tablet (50 mg total) by mouth every 6 (six) hours as needed for moderate pain. 12/04/19   Sable Feil, PA-C    Allergies Banana and Codeine  No family history on file.  Social History Social History   Tobacco  Use  . Smoking status: Current Every Day Smoker  . Smokeless tobacco: Never Used  Substance Use Topics  . Alcohol use: Yes    Comment: daily beer  . Drug use: Not on file    Review of Systems  Constitutional: No fever/chills Eyes: No visual changes. ENT: No sore throat. Cardiovascular: Denies chest pain. Respiratory: Denies shortness of breath. Gastrointestinal: No abdominal pain.  No nausea, no vomiting.  No diarrhea.  No constipation. Genitourinary: Negative for dysuria. Musculoskeletal: Right buttock and leg pain. Skin: Negative for rash. Neurological: Negative for headaches, focal weakness or numbness.  Endocrine:  Hypertension Allergic/Immunilogical: Bananas and codeine. ____________________________________________   PHYSICAL EXAM:  VITAL SIGNS: ED Triage Vitals [12/04/19 0643]  Enc Vitals Group     BP (!) 133/100     Pulse Rate 95     Resp 18     Temp 97.8 F (36.6 C)     Temp Source Oral     SpO2 100 %     Weight 117 lb (53.1 kg)     Height 5\' 4"  (1.626 m)     Head Circumference      Peak Flow      Pain Score 10     Pain Loc      Pain Edu?      Excl. in [place]?     Constitutional: Alert and oriented.  Moderate distress.   Cardiovascular: Normal rate, regular rhythm.  Grossly normal heart sounds.  Good peripheral circulation. Respiratory: Normal respiratory effort.  No retractions. Lungs CTAB. Genitourinary: Deferred Musculoskeletal: No lower extremity tenderness nor edema.  No joint effusions. Neurologic:  Normal speech and language. No gross focal neurologic deficits are appreciated. No gait instability. Skin:  Skin is warm, dry and intact. No rash noted. Psychiatric: Mood and affect are normal. Speech and behavior are normal.  ____________________________________________   LABS (all labs ordered are listed, but only abnormal results are displayed)  Labs Reviewed - No data to  display ____________________________________________  EKG   ____________________________________________  RADIOLOGY  ED MD interpretation:    Official radiology report(s): No results found.  ____________________________________________   PROCEDURES  Procedure(s) performed (including Critical Care):  Procedures   ____________________________________________   INITIAL IMPRESSION / ASSESSMENT AND PLAN / ED COURSE  As part of my medical decision making, I reviewed the following data within the electronic MEDICAL RECORD NUMBER     Patient presents with acute right buttocks pain radiating to the mid posterior thigh.  Patient complaint physical exam consistent with sciatica.  Patient given discharge care instruction advised take medication as directed.  Patient advised follow-up with scheduled PCP appointment on the 17th of this month.    Becky Ward was evaluated in Emergency Department on 12/04/2019 for the symptoms described in the history of present illness. She was evaluated in the context of the global COVID-19 pandemic, which necessitated consideration that the patient might be at risk for infection with the SARS-CoV-2 virus that causes COVID-19. Institutional protocols and algorithms that pertain to the evaluation of patients at risk for COVID-19 are in a state of rapid change based on information released by regulatory bodies including the CDC and federal and state organizations. These policies and algorithms were followed during the patient's care in the ED.       ____________________________________________   FINAL CLINICAL IMPRESSION(S) / ED DIAGNOSES  Final diagnoses:  Sciatica of right side     ED Discharge Orders         Ordered    cyclobenzaprine (FLEXERIL) 10 MG tablet  3 times daily PRN     Discontinue  Reprint     12/04/19 0833    methylPREDNISolone (MEDROL DOSEPAK) 4 MG TBPK tablet     Discontinue  Reprint     12/04/19 0833    traMADol (ULTRAM) 50 MG  tablet  Every 6 hours PRN     Discontinue  Reprint     12/04/19 1610           Note:  This document was prepared using Dragon voice recognition software and may include unintentional dictation errors.    Joni Reining, PA-C 12/04/19 9604    Minna Antis, MD 12/04/19 1444

## 2019-12-04 NOTE — ED Triage Notes (Signed)
Pt to triage via w/c with no distress noted, mask in place; pt reports rt buttock pain radiating down rt leg with no known injury

## 2019-12-04 NOTE — ED Notes (Signed)
See triage note  Presents with pain to right hip area  States pain started 3 days ago w/o injury  Pain is non radiating

## 2020-02-03 ENCOUNTER — Emergency Department
Admission: EM | Admit: 2020-02-03 | Discharge: 2020-02-04 | Disposition: A | Discharge status: Home / Self Care | Payer: Medicare Other | Attending: Emergency Medicine | Admitting: Emergency Medicine

## 2020-02-03 ENCOUNTER — Emergency Department: Payer: Medicare Other

## 2020-02-03 ENCOUNTER — Other Ambulatory Visit: Payer: Self-pay

## 2020-02-03 DIAGNOSIS — S0990XA Unspecified injury of head, initial encounter: Secondary | ICD-10-CM | POA: Diagnosis present

## 2020-02-03 DIAGNOSIS — I609 Nontraumatic subarachnoid hemorrhage, unspecified: Secondary | ICD-10-CM | POA: Diagnosis not present

## 2020-02-03 DIAGNOSIS — Y998 Other external cause status: Secondary | ICD-10-CM | POA: Insufficient documentation

## 2020-02-03 DIAGNOSIS — W010XXA Fall on same level from slipping, tripping and stumbling without subsequent striking against object, initial encounter: Secondary | ICD-10-CM | POA: Diagnosis not present

## 2020-02-03 DIAGNOSIS — S0101XA Laceration without foreign body of scalp, initial encounter: Secondary | ICD-10-CM | POA: Insufficient documentation

## 2020-02-03 DIAGNOSIS — Z79899 Other long term (current) drug therapy: Secondary | ICD-10-CM | POA: Insufficient documentation

## 2020-02-03 DIAGNOSIS — F17210 Nicotine dependence, cigarettes, uncomplicated: Secondary | ICD-10-CM | POA: Insufficient documentation

## 2020-02-03 DIAGNOSIS — Y92009 Unspecified place in unspecified non-institutional (private) residence as the place of occurrence of the external cause: Secondary | ICD-10-CM | POA: Insufficient documentation

## 2020-02-03 DIAGNOSIS — Y9389 Activity, other specified: Secondary | ICD-10-CM | POA: Diagnosis not present

## 2020-02-03 DIAGNOSIS — I6200 Nontraumatic subdural hemorrhage, unspecified: Secondary | ICD-10-CM | POA: Diagnosis not present

## 2020-02-03 DIAGNOSIS — I10 Essential (primary) hypertension: Secondary | ICD-10-CM | POA: Insufficient documentation

## 2020-02-03 DIAGNOSIS — W19XXXA Unspecified fall, initial encounter: Secondary | ICD-10-CM

## 2020-02-03 MED ORDER — ONDANSETRON 4 MG PO TBDP
4.0000 mg | ORAL_TABLET | Freq: Once | ORAL | Status: AC
  Administered 2020-02-03: 4 mg via ORAL
  Filled 2020-02-03: qty 1

## 2020-02-03 MED ORDER — LEVETIRACETAM 500 MG PO TABS
1000.0000 mg | ORAL_TABLET | Freq: Once | ORAL | Status: AC
  Administered 2020-02-03: 1000 mg via ORAL
  Filled 2020-02-03: qty 2

## 2020-02-03 MED ORDER — ACETAMINOPHEN 325 MG PO TABS
650.0000 mg | ORAL_TABLET | Freq: Once | ORAL | Status: AC
  Administered 2020-02-03: 650 mg via ORAL
  Filled 2020-02-03: qty 2

## 2020-02-03 NOTE — ED Notes (Signed)
See triage note Presents s/p fall  Has laceration to head  No LOC

## 2020-02-03 NOTE — ED Notes (Signed)
Dermabond applied by Marisue Humble, RN

## 2020-02-03 NOTE — ED Triage Notes (Signed)
Pt comes POV after mechanical fall onto top of hose. Lac to back of head. Bleeding stopped at this time. No thinners.

## 2020-02-03 NOTE — ED Provider Notes (Signed)
[facility] Emergency Department Provider Note ____________________________________________  Time seen: 1831  I have reviewed the triage vital signs and the nursing notes.  HISTORY  Chief Complaint  Head Injury  HPI Becky Ward is a 64 y.o. female presents to the ED from home after EMS evaluation.  Patient had a mechanical fall witnessed by family member.  She apparently tripped over a hose, and fell, hitting the back of her head on a tire rim.  There was no reported LOC, but the patient had significant bleeding due to laceration to the back of the scalp.  Patient denies any blood thinner use, she denies any neck pain, nausea, vomiting, dizziness, weakness.  She presents now at the advice of her adult daughter for further evaluation.  Patient reports only pain to the scalp at this time.  Past Medical History:  Diagnosis Date  . Colitis   . Hypertension     There are no problems to display for this patient.   Past Surgical History:  Procedure Laterality Date  . NASAL SINUS SURGERY      Prior to Admission medications   Medication Sig Start Date End Date Taking? Authorizing Provider  latanoprost (XALATAN) 0.005 % ophthalmic solution Place 1 drop into both eyes at bedtime.  02/08/15   [provider]  lisinopril-hydrochlorothiazide (PRINZIDE,ZESTORETIC) 20-12.5 MG per tablet Take 1 tablet by mouth daily. 01/28/15   [provider]    Allergies Banana and Codeine  History reviewed. No pertinent family history.  Social History Social History   Tobacco Use  . Smoking status: Current Every Day Smoker  . Smokeless tobacco: Never Used  Substance Use Topics  . Alcohol use: Yes    Comment: daily beer  . Drug use: Not on file    Review of Systems  Constitutional: Negative for fever. Eyes: Negative for visual changes. ENT: Negative for sore throat. Cardiovascular: Negative for chest pain. Respiratory: Negative for shortness of  breath. Gastrointestinal: Negative for abdominal pain, vomiting and diarrhea. Genitourinary: Negative for dysuria. Musculoskeletal: Negative for back pain. Skin: Negative for rash. Scalp laceration.  Neurological: Negative for headaches, focal weakness or numbness. ____________________________________________  PHYSICAL EXAM:  VITAL SIGNS: ED Triage Vitals  Enc Vitals Group     BP 02/03/20 1606 113/75     Pulse Rate 02/03/20 1606 87     Resp 02/03/20 1606 16     Temp 02/03/20 1606 99 F (37.2 C)     Temp Source 02/03/20 1606 Oral     SpO2 02/03/20 1606 99 %     Weight 02/03/20 1613 120 lb (54.4 kg)     Height 02/03/20 1613 5\' 4"  (1.626 m)     Head Circumference --      Peak Flow --      Pain Score 02/03/20 1612 5     Pain Loc --      Pain Edu? --      Excl. in GC? --     Constitutional: Alert and oriented. Well appearing and in no distress. GCS = 15 Head: Normocephalic and atraumatic, except for a moderate scalp hematoma to the occiput.  Patient's hair is hardened due to dried blood throughout. Eyes: Conjunctivae are normal. PERRL. Normal extraocular movements Ears: Canals clear. TMs intact bilaterally. Neck: Supple. Normal ROM Cardiovascular: Normal rate, regular rhythm. Normal distal pulses. Respiratory: Normal respiratory effort. No wheezes/rales/rhonchi. Gastrointestinal: Soft and nontender. No distention. Musculoskeletal: Nontender with normal range of motion in all extremities.  Neurologic: CN II-XII grossly  intact. Normal gait without ataxia. Normal speech and language. No gross focal neurologic deficits are appreciated. Skin:  Skin is warm, dry and intact. No rash noted. Superficial scalp laceration noted.  Psychiatric: Mood and affect are normal. Patient exhibits appropriate insight and judgment. ____________________________________________   RADIOLOGY  Head CT w/o CM IMPRESSION: 1. Positive for small volume right side Subdural Hematoma (3 mm) and trace  scattered Subarachnoid Hemorrhage.  2. No intracranial mass effect. No intraventricular hemorrhage or ventriculomegaly.  3. Posterior scalp laceration and hematoma without underlying skull fracture.  4. Confluent dystrophic calcification of the right thalamus, perhaps due to a remote inflammatory or ischemic insult.  Head CT w/o CM (6 hr Interval)  ____________________________________________  PROCEDURES  Tylenol 650 mg PO Zofran 4 mg ODT levetiracetam 1000 mg PO  Procedures ____________________________________________  INITIAL IMPRESSION / ASSESSMENT AND PLAN / ED COURSE  ----------------------------------------- 8:47 PM on 02/03/2020 ----------------------------------------- S/W Dr. Marcell Barlow: He suggests 6hr interval head CT and D/C if stable for outpatient F/U on Keppra.   DDX: concussion, subarachnoid/subdural, hematoma  Patient with ED evaluation of injury sustained following a mechanical fall.  Patient sustained a head contusion, scalp laceration, and was found on CT scan to have a small, 3 mm subdural/subarachnoid hemorrhage.  Patient is clinically stable on exam without any signs of acute neuromuscular deficit or cerebellar ataxia.  She is stable at this time for evaluation with interim CT scan.  If stable patient is discharged home with Keppra, and instructions to follow-up with neurosurgery.  If she is found to have an expanding bleed, patient will be admitted by hospitalist and followed inpatient by neurosurgery.  Patient understands her diagnosis, prognosis, clinical decision making based on her CT results.  She is agreeable to await a repeat CT scan.  Patient is otherwise stable at the time of this disposition.  Her care was transferred to my attending physician for final disposition after the repeat CT scan.  Becky Ward was evaluated in Emergency Department on 02/04/2020 for the symptoms described in the history of present illness. She was evaluated in the  context of the global COVID-19 pandemic, which necessitated consideration that the patient might be at risk for infection with the SARS-CoV-2 virus that causes COVID-19. Institutional protocols and algorithms that pertain to the evaluation of patients at risk for COVID-19 are in a state of rapid change based on information released by regulatory bodies including the CDC and federal and state organizations. These policies and algorithms were followed during the patient's care in the ED. ____________________________________________  FINAL CLINICAL IMPRESSION(S) / ED DIAGNOSES  Final diagnoses:  Fall at home, initial encounter  Subarachnoid bleed (HCC)  Subdural bleeding (HCC)  Scalp laceration, initial encounter      Lissa Hoard, PA-C 02/04/20 0032    Don Perking, [place], MD 02/04/20 [phone]

## 2020-02-03 NOTE — Discharge Instructions (Addendum)
Take the prescription meds as directed.  °

## 2020-02-04 ENCOUNTER — Emergency Department: Payer: Medicare Other

## 2020-02-04 MED ORDER — ACETAMINOPHEN 500 MG PO TABS
1000.0000 mg | ORAL_TABLET | Freq: Once | ORAL | Status: AC
  Administered 2020-02-04: 1000 mg via ORAL
  Filled 2020-02-04: qty 2

## 2020-02-04 MED ORDER — ONDANSETRON 4 MG PO TBDP: 4.0000 mg | ORAL_TABLET | Freq: Three times a day (TID) | ORAL | 0 refills | Status: DC | PRN

## 2020-02-04 MED ORDER — LEVETIRACETAM 500 MG PO TABS: 500.0000 mg | ORAL_TABLET | Freq: Two times a day (BID) | ORAL | 0 refills | Status: DC

## 2020-02-04 NOTE — ED Notes (Signed)
Report to Sarah, RN

## 2020-02-04 NOTE — ED Notes (Signed)
Pt unable to sign E-signature due to signature pad malfunction. Pt verbalized understanding of d/c instructions and had no additional questions or concerns for this RN or provider. Pt left with d/c instructions and gathered all personal belongings from room and removed them prior to ED departure.   

## 2020-02-05 ENCOUNTER — Other Ambulatory Visit: Payer: Self-pay | Admitting: Neurosurgery

## 2020-02-05 DIAGNOSIS — I609 Nontraumatic subarachnoid hemorrhage, unspecified: Secondary | ICD-10-CM

## 2020-03-07 ENCOUNTER — Ambulatory Visit: Payer: Medicare Other

## 2020-03-09 DIAGNOSIS — Z8679 Personal history of other diseases of the circulatory system: Secondary | ICD-10-CM | POA: Insufficient documentation

## 2020-05-13 DIAGNOSIS — I10 Essential (primary) hypertension: Secondary | ICD-10-CM | POA: Insufficient documentation

## 2021-04-07 DIAGNOSIS — R748 Abnormal levels of other serum enzymes: Secondary | ICD-10-CM | POA: Insufficient documentation

## 2021-04-24 ENCOUNTER — Inpatient Hospital Stay: Payer: Medicare Other | Attending: Internal Medicine | Admitting: Internal Medicine

## 2021-04-24 ENCOUNTER — Other Ambulatory Visit: Payer: Self-pay

## 2021-04-24 ENCOUNTER — Encounter: Payer: Self-pay | Admitting: Internal Medicine

## 2021-04-24 ENCOUNTER — Inpatient Hospital Stay: Payer: Medicare Other

## 2021-04-24 DIAGNOSIS — Z7982 Long term (current) use of aspirin: Secondary | ICD-10-CM | POA: Diagnosis not present

## 2021-04-24 DIAGNOSIS — F17200 Nicotine dependence, unspecified, uncomplicated: Secondary | ICD-10-CM | POA: Diagnosis not present

## 2021-04-24 DIAGNOSIS — I10 Essential (primary) hypertension: Secondary | ICD-10-CM | POA: Diagnosis not present

## 2021-04-24 DIAGNOSIS — D75839 Thrombocytosis, unspecified: Secondary | ICD-10-CM | POA: Insufficient documentation

## 2021-04-24 DIAGNOSIS — R918 Other nonspecific abnormal finding of lung field: Secondary | ICD-10-CM | POA: Insufficient documentation

## 2021-04-24 DIAGNOSIS — M25531 Pain in right wrist: Secondary | ICD-10-CM | POA: Diagnosis not present

## 2021-04-24 LAB — CBC WITH DIFFERENTIAL/PLATELET
Abs Immature Granulocytes: 0.03 10*3/uL (ref 0.00–0.07)
Basophils Absolute: 0.1 10*3/uL (ref 0.0–0.1)
Basophils Relative: 1 %
Eosinophils Absolute: 0.5 10*3/uL (ref 0.0–0.5)
Eosinophils Relative: 6 %
HCT: 37.4 % (ref 36.0–46.0)
Hemoglobin: 12.4 g/dL (ref 12.0–15.0)
Immature Granulocytes: 0 %
Lymphocytes Relative: 18 %
Lymphs Abs: 1.5 10*3/uL (ref 0.7–4.0)
MCH: 32 pg (ref 26.0–34.0)
MCHC: 33.2 g/dL (ref 30.0–36.0)
MCV: 96.4 fL (ref 80.0–100.0)
Monocytes Absolute: 0.6 10*3/uL (ref 0.1–1.0)
Monocytes Relative: 7 %
Neutro Abs: 5.8 10*3/uL (ref 1.7–7.7)
Neutrophils Relative %: 68 %
Platelets: 445 10*3/uL — ABNORMAL HIGH (ref 150–400)
RBC: 3.88 MIL/uL (ref 3.87–5.11)
RDW: 12.9 % (ref 11.5–15.5)
WBC: 8.5 10*3/uL (ref 4.0–10.5)
nRBC: 0 % (ref 0.0–0.2)

## 2021-04-24 LAB — COMPREHENSIVE METABOLIC PANEL
ALT: 25 U/L (ref 0–44)
AST: 41 U/L (ref 15–41)
Albumin: 3.6 g/dL (ref 3.5–5.0)
Alkaline Phosphatase: 132 U/L — ABNORMAL HIGH (ref 38–126)
Anion gap: 9 (ref 5–15)
BUN: 12 mg/dL (ref 8–23)
CO2: 25 mmol/L (ref 22–32)
Calcium: 8.2 mg/dL — ABNORMAL LOW (ref 8.9–10.3)
Chloride: 104 mmol/L (ref 98–111)
Creatinine, Ser: 0.62 mg/dL (ref 0.44–1.00)
GFR, Estimated: >60 mL/min (ref >60)
Glucose, Bld: 107 mg/dL — ABNORMAL HIGH (ref 70–99)
Potassium: 4 mmol/L (ref 3.5–5.1)
Sodium: 138 mmol/L (ref 135–145)
Total Bilirubin: 0.5 mg/dL (ref 0.3–1.2)
Total Protein: 7.4 g/dL (ref 6.5–8.1)

## 2021-04-24 LAB — TECHNOLOGIST SMEAR REVIEW
Plt Morphology: NORMAL
RBC MORPHOLOGY: NORMAL
WBC MORPHOLOGY: NORMAL

## 2021-04-24 LAB — LACTATE DEHYDROGENASE: LDH: 157 U/L (ref 98–192)

## 2021-04-24 NOTE — Assessment & Plan Note (Addendum)
#  Thrombocytosis MILD [450-500]-question essential thrombocytosis versus reactive. Patient is asymptomatic.  Long discussion with the patient regarding potential cause of the abnormal blood counts-including but not limited to primary bone diseases versus secondary-infection information iron deficiency etc.   For now I  recommend checking CBC;CMP jak 2 BCR ABL; MPL; CALR mutation on the peripheral blood.LDH;  Check peripheral smear. Also discussed regarding bone marrow biopsy with the above workup is inconclusive; however I would prefer not to do a bone marrow unless absolutely needed. I discussed the potential concerns for stroke with elevated platelets. Patient is on aspirin.  # Active smoker: Discussed with the patient regarding the ill effects of smoking- including but not limited to cardiac lung and vascular diseases and malignancies. Counseled against smoking.  Reviewed- [place] 2022- CT scan- 1. Numerous bilateral solid and groundglass pulmonary nodules; New 6 mm part solid pulmonary nodule in the right middle lobe . Defer to PCP  Thank you Dr.Boinpally for allowing me to participate in the care of your pleasant patient. Please do not hesitate to contact me with questions or concerns in the interim.  # Patient follow-up with me in approximately 2-3 weeks to review the above results. All questions were answered. The patient knows to call the clinic with any problems, questions or concerns.  # DISPOSITION: # labs today # follow up 2-3 weeks; MD; no labs- -Dr.B

## 2021-04-24 NOTE — Progress Notes (Signed)
[place] CONSULT NOTE  Patient Care Team: Cindra Eves, MD as PCP - General (Internal Medicine)  CHIEF COMPLAINTS/PURPOSE OF CONSULTATION: Thrombocytosis.   HEMATOLOGY HISTORY  # THROMBOCYTOSIS [platelets- 450-500; since 2022; Hb-13; white count-6.8];     HISTORY OF PRESENTING ILLNESS: Ambulating independently.  Alone. Becky Ward 65 y.o.  female pleasant patient was been referred to [place] for further evaluation of elevated platelets which was incidentally found on blood work.   Patient denies any history of blood clots or strokes. Denies any burning pain or discoloration in the fingertips or toes. Patient takes aspirin once a day.   Patient complains of joint pains especially right wrist. Appetite is good . No weight loss or night sweats no recurrent fevers. No cough. No new shortness of breath.   Review of Systems  Constitutional:  Positive for malaise/fatigue. Negative for chills, diaphoresis, fever and weight loss.  HENT:  Negative for nosebleeds and sore throat.   Eyes:  Negative for double vision.  Respiratory:  Negative for cough, hemoptysis, sputum production, shortness of breath and wheezing.   Cardiovascular:  Negative for chest pain, palpitations, orthopnea and leg swelling.  Gastrointestinal:  Negative for abdominal pain, blood in stool, constipation, diarrhea, heartburn, melena, nausea and vomiting.  Genitourinary:  Negative for dysuria, frequency and urgency.  Musculoskeletal:  Positive for back pain, joint pain and myalgias.  Skin: Negative.  Negative for itching and rash.  Neurological:  Positive for tingling. Negative for dizziness, focal weakness, weakness and headaches.  Endo/Heme/Allergies:  Does not bruise/bleed easily.  Psychiatric/Behavioral:  Negative for depression. The patient is not nervous/anxious and does not have insomnia.     MEDICAL HISTORY:  Past Medical History:  Diagnosis Date   Colitis    Hypertension     SURGICAL  HISTORY: Past Surgical History:  Procedure Laterality Date   NASAL SINUS SURGERY      SOCIAL HISTORY: Social History   Socioeconomic History   Marital status: Single    Spouse name: Not on file   Number of children: Not on file   Years of education: Not on file   Highest education level: Not on file  Occupational History   Not on file  Tobacco Use   Smoking status: Every Day   Smokeless tobacco: Never  Substance and Sexual Activity   Alcohol use: Yes    Comment: daily beer   Drug use: Not Currently   Sexual activity: Not Currently  Other Topics Concern   Not on file  Social History Narrative   Alcohol- 3-4 beers-day/smoking: 1ppd; lives in [place]; 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    Social Determinants of Health   Financial Resource Strain: Not on file  Food Insecurity: Not on file  Transportation Needs: Not on file  Physical Activity: Not on file  Stress: Not on file  Social Connections: Not on file  Intimate Partner Violence: Not on file    FAMILY HISTORY: Family History  Problem Relation Age of Onset   Leukemia Father     ALLERGIES:  is allergic to ciprofloxacin, banana, and codeine.  MEDICATIONS:  Current Outpatient Medications  Medication Sig Dispense Refill   lisinopril-hydrochlorothiazide (PRINZIDE,ZESTORETIC) 20-12.5 MG per tablet Take 1 tablet by mouth daily.     ondansetron (ZOFRAN ODT) 4 MG disintegrating tablet Take 1 tablet (4 mg total) by mouth every 8 (eight) hours as needed. 15 tablet 0   pravastatin (PRAVACHOL) 20 MG tablet Take 1 tablet  by mouth at bedtime.     latanoprost (XALATAN) 0.005 % ophthalmic solution Place 1 drop into both eyes at bedtime.  (Patient not taking: Reported on 04/24/2021)     levETIRAcetam (KEPPRA) 500 MG tablet Take 1 tablet (500 mg total) by mouth 2 (two) times daily for 7 days. (Patient not taking: Reported on 04/24/2021) 14 tablet 0   No current  facility-administered medications for this visit.     PHYSICAL EXAMINATION:   Vitals:   04/24/21 1040  BP: (!) 161/98  Pulse: 81  Resp: 20  Temp: 97.8 F (36.6 C)  SpO2: 100%   Filed Weights   04/24/21 1040  Weight: 112 lb (50.8 kg)    Physical Exam Vitals and nursing note reviewed.  HENT:     Head: Normocephalic and atraumatic.     Mouth/Throat:     Pharynx: Oropharynx is clear.  Eyes:     Extraocular Movements: Extraocular movements intact.     Pupils: Pupils are equal, round, and reactive to light.  Cardiovascular:     Rate and Rhythm: Normal rate and regular rhythm.  Pulmonary:     Comments: Decreased breath sounds bilaterally.  Abdominal:     Palpations: Abdomen is soft.  Musculoskeletal:        General: Normal range of motion.     Cervical back: Normal range of motion.  Skin:    General: Skin is warm.  Neurological:     General: No focal deficit present.     Mental Status: She is alert and oriented to person, place, and time.  Psychiatric:        Behavior: Behavior normal.        Judgment: Judgment normal.     LABORATORY DATA:  I have reviewed the data as listed Lab Results  Component Value Date   WBC 8.5 04/24/2021   HGB 12.4 04/24/2021   HCT 37.4 04/24/2021   MCV 96.4 04/24/2021   PLT 445 (H) 04/24/2021   Recent Labs    04/24/21 1130  NA 138  K 4.0  CL 104  CO2 25  GLUCOSE 107*  BUN 12  CREATININE 0.62  CALCIUM 8.2*  GFRNONAA >60  PROT 7.4  ALBUMIN 3.6  AST 41  ALT 25  ALKPHOS 132*  BILITOT 0.5     No results found.  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 Patient is asymptomatic.  Long discussion with the patient regarding potential cause of the abnormal blood counts-including but not limited to primary bone diseases versus secondary-infection information iron deficiency etc.   For now I  recommend checking CBC;CMP jak 2 BCR ABL; MPL; CALR  mutation on the peripheral blood.LDH;  Check peripheral smear. Also discussed regarding bone marrow biopsy with the above workup is inconclusive; however I would prefer not to do a bone marrow unless absolutely needed. I discussed the potential concerns for stroke with elevated platelets. Patient is on aspirin.  # Active smoker: Discussed with the patient regarding the ill effects of smoking- including but not limited to cardiac lung and vascular diseases and malignancies. Counseled against smoking.  Reviewed- [place] 2022- CT scan- 1.  Numerous bilateral solid and groundglass pulmonary nodules; New 6 mm part solid pulmonary nodule in the right middle lobe . Defer to PCP  Thank you Dr.Boinpally for allowing me to participate in the care of your pleasant patient. Please do not hesitate to contact me with questions or concerns in the interim.  #  Patient follow-up with me in approximately 2-3 weeks to review the above results. All questions were answered. The patient knows to call the clinic with any problems, questions or concerns.  # DISPOSITION: # labs today # follow up 2-3 weeks; MD; no labs- -Dr.B       Cammie Sickle, MD 04/24/2021 1:06 PM

## 2021-04-27 LAB — BCR-ABL1 FISH
Cells Analyzed: 200
Cells Counted: 200

## 2021-05-01 LAB — JAK2 GENOTYPR

## 2021-05-03 LAB — CALRETICULIN (CALR) MUTATION ANALYSIS

## 2021-05-05 LAB — MPL MUTATION ANALYSIS

## 2021-05-16 ENCOUNTER — Other Ambulatory Visit: Payer: Self-pay

## 2021-05-16 ENCOUNTER — Encounter: Payer: Self-pay | Admitting: Internal Medicine

## 2021-05-16 ENCOUNTER — Inpatient Hospital Stay: Payer: Medicare Other | Attending: Internal Medicine | Admitting: Internal Medicine

## 2021-05-16 DIAGNOSIS — D75839 Thrombocytosis, unspecified: Secondary | ICD-10-CM | POA: Diagnosis not present

## 2021-05-16 DIAGNOSIS — G8929 Other chronic pain: Secondary | ICD-10-CM | POA: Diagnosis not present

## 2021-05-16 DIAGNOSIS — R918 Other nonspecific abnormal finding of lung field: Secondary | ICD-10-CM | POA: Insufficient documentation

## 2021-05-16 DIAGNOSIS — F17210 Nicotine dependence, cigarettes, uncomplicated: Secondary | ICD-10-CM | POA: Insufficient documentation

## 2021-05-16 NOTE — Assessment & Plan Note (Addendum)
#  Thrombocytosis MILD [450-500]-question essential thrombocytosis versus reactive. Patient is asymptomatic.  Patient blood work including JAK2 mutation/CALR/MPL-negative.  BCR ABL is negative.  Peripheral blood review negative for any major concerns.  #October 2022 platelets 447.  I suspect patient's thrombocytosis is reactive rather than any malignant process.  Hold off for a bone marrow biopsy at this time.  Discussed that if patient's platelets continue to get worse/rise would recommend a bone marrow biopsy in 6 months.  Question related to smoking.-See below  # Lung nodules:  Reviewed- [facility] JUL[facility] 2022- CT scan- 1. Numerous bilateral solid and groundglass pulmonary nodules; New 6 mm part solid pulmonary nodule in the right middle lobe.  Again reminded following up with PCP.  # Active smoker: Discussed with the patient regarding the ill effects of smoking- including but not limited to cardiac lung and vascular diseases and malignancies.  Again strongly recommend the patient quit smoking.  Again discussed that smoking could because of her elevated platelets.  # DISPOSITION: # follow up 6 months MD; Labs- cbc/cmp/LDH -Dr.B

## 2021-05-16 NOTE — Progress Notes (Signed)
[place] CONSULT NOTE  Patient Care Team: Cindra Eves, MD as PCP - General (Internal Medicine)  CHIEF COMPLAINTS/PURPOSE OF CONSULTATION: Thrombocytosis.   HEMATOLOGY HISTORY  # THROMBOCYTOSIS [platelets- 450-500; since 2022; Hb-13; white count-6.8]; MPN work-up negative/BCR ABL negative  #Active smoker/lung cancer screening program-PCP -[facility].    HISTORY OF PRESENTING ILLNESS: Ambulating independently.  Alone. Becky Ward 65 y.o.  female pleasant patient is here to review the results of her blood work for elevated platelets which was incidentally found on blood work.   Continues to have chronic joint pains.  No new shortness of breath or cough.  Review of Systems  Constitutional:  Positive for malaise/fatigue. Negative for chills, diaphoresis, fever and weight loss.  HENT:  Negative for nosebleeds and sore throat.   Eyes:  Negative for double vision.  Respiratory:  Negative for cough, hemoptysis, sputum production, shortness of breath and wheezing.   Cardiovascular:  Negative for chest pain, palpitations, orthopnea and leg swelling.  Gastrointestinal:  Negative for abdominal pain, blood in stool, constipation, diarrhea, heartburn, melena, nausea and vomiting.  Genitourinary:  Negative for dysuria, frequency and urgency.  Musculoskeletal:  Positive for back pain, joint pain and myalgias.  Skin: Negative.  Negative for itching and rash.  Neurological:  Positive for tingling. Negative for dizziness, focal weakness, weakness and headaches.  Endo/Heme/Allergies:  Does not bruise/bleed easily.  Psychiatric/Behavioral:  Negative for depression. The patient is not nervous/anxious and does not have insomnia.     MEDICAL HISTORY:  Past Medical History:  Diagnosis Date   Colitis    Hypertension     SURGICAL HISTORY: Past Surgical History:  Procedure Laterality Date   NASAL SINUS SURGERY      SOCIAL HISTORY: Social History   Socioeconomic History    Marital status: Single    Spouse name: Not on file   Number of children: Not on file   Years of education: Not on file   Highest education level: Not on file  Occupational History   Not on file  Tobacco Use   Smoking status: Every Day   Smokeless tobacco: Never  Substance and Sexual Activity   Alcohol use: Yes    Comment: daily beer   Drug use: Not Currently   Sexual activity: Not Currently  Other Topics Concern   Not on file  Social History Narrative   Alcohol- 3-4 beers-day/smoking: 1ppd; lives in [place]; 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    Social Determinants of Health   Financial Resource Strain: Not on file  Food Insecurity: Not on file  Transportation Needs: Not on file  Physical Activity: Not on file  Stress: Not on file  Social Connections: Not on file  Intimate Partner Violence: Not on file    FAMILY HISTORY: Family History  Problem Relation Age of Onset   Leukemia Father     ALLERGIES:  is allergic to ciprofloxacin, banana, and codeine.  MEDICATIONS:  Current Outpatient Medications  Medication Sig Dispense Refill   Aspirin-Acetaminophen-Caffeine 626-948-54 MG PACK Take by mouth as needed.     B Complex Vitamins (VITAMIN B COMPLEX) TABS Take 1 tablet by mouth every morning.     Cholecalciferol (VITAMIN D3) 10 MCG (400 UNIT) tablet Take by mouth.     lisinopril-hydrochlorothiazide (PRINZIDE,ZESTORETIC) 20-12.5 MG per tablet Take 1 tablet by mouth daily.     ondansetron (ZOFRAN ODT) 4 MG disintegrating tablet Take 1 tablet (4 mg total) by mouth every 8 (eight)  hours as needed. 15 tablet 0   pravastatin (PRAVACHOL) 20 MG tablet Take 1 tablet by mouth at bedtime.     latanoprost (XALATAN) 0.005 % ophthalmic solution Place 1 drop into both eyes at bedtime.  (Patient not taking: Reported on 04/24/2021)     levETIRAcetam (KEPPRA) 500 MG tablet Take 1 tablet (500 mg total) by mouth 2 (two) times daily for 7  days. (Patient not taking: Reported on 04/24/2021) 14 tablet 0   No current facility-administered medications for this visit.     PHYSICAL EXAMINATION:   Vitals:   05/16/21 1101  BP: 129/85  Pulse: 79  Resp: 16  Temp: 98.5 F (36.9 C)   Filed Weights   05/16/21 1101  Weight: 116 lb 3.2 oz (52.7 kg)    Physical Exam Vitals and nursing note reviewed.  HENT:     Head: Normocephalic and atraumatic.     Mouth/Throat:     Pharynx: Oropharynx is clear.  Eyes:     Extraocular Movements: Extraocular movements intact.     Pupils: Pupils are equal, round, and reactive to light.  Cardiovascular:     Rate and Rhythm: Normal rate and regular rhythm.  Pulmonary:     Comments: Decreased breath sounds bilaterally.  Abdominal:     Palpations: Abdomen is soft.  Musculoskeletal:        General: Normal range of motion.     Cervical back: Normal range of motion.  Skin:    General: Skin is warm.  Neurological:     General: No focal deficit present.     Mental Status: She is alert and oriented to person, place, and time.  Psychiatric:        Behavior: Behavior normal.        Judgment: Judgment normal.     LABORATORY DATA:  I have reviewed the data as listed Lab Results  Component Value Date   WBC 8.5 04/24/2021   HGB 12.4 04/24/2021   HCT 37.4 04/24/2021   MCV 96.4 04/24/2021   PLT 445 (H) 04/24/2021   Recent Labs    04/24/21 1130  NA 138  K 4.0  CL 104  CO2 25  GLUCOSE 107*  BUN 12  CREATININE 0.62  CALCIUM 8.2*  GFRNONAA >60  PROT 7.4  ALBUMIN 3.6  AST 41  ALT 25  ALKPHOS 132*  BILITOT 0.5     No results found.  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 Patient is asymptomatic.  Patient blood work including JAK2 mutation/CALR/MPL-negative.  BCR ABL is negative.  Peripheral blood review negative for any major concerns.  #October 2022 platelets 447.  I suspect patient's  thrombocytosis is reactive rather than any malignant process.  Hold off for a bone marrow biopsy at this time.  Discussed that if patient's platelets continue to get worse/rise would recommend a bone marrow biopsy in 6 months.  Question related to smoking.-See below  # Lung nodules:  Reviewed- [facility] JUL[facility] 2022- CT scan- 1.  Numerous bilateral solid and groundglass pulmonary nodules; New 6 mm part solid pulmonary nodule in the right middle lobe.  Again reminded following up with PCP.  # Active smoker: Discussed with the patient regarding the ill effects of smoking- including but not limited to cardiac lung and vascular diseases and malignancies.  Again strongly recommend the patient quit smoking.  Again discussed that smoking could because of her elevated platelets.  # DISPOSITION: # follow up 6 months MD; Labs-  cbc/cmp/LDH -Dr.B       Cammie Sickle, MD 05/16/2021 12:32 PM

## 2021-05-16 NOTE — Progress Notes (Signed)
Patient denies new problems/concerns today.   °

## 2021-08-08 ENCOUNTER — Encounter: Payer: Self-pay | Admitting: Emergency Medicine

## 2021-08-08 ENCOUNTER — Emergency Department: Payer: Medicare Other

## 2021-08-08 ENCOUNTER — Emergency Department
Admission: EM | Admit: 2021-08-08 | Discharge: 2021-08-08 | Disposition: A | Discharge status: Home / Self Care | Payer: Medicare Other | Attending: Emergency Medicine | Admitting: Emergency Medicine

## 2021-08-08 DIAGNOSIS — I10 Essential (primary) hypertension: Secondary | ICD-10-CM | POA: Diagnosis not present

## 2021-08-08 DIAGNOSIS — D72829 Elevated white blood cell count, unspecified: Secondary | ICD-10-CM | POA: Insufficient documentation

## 2021-08-08 DIAGNOSIS — R0781 Pleurodynia: Secondary | ICD-10-CM | POA: Diagnosis not present

## 2021-08-08 DIAGNOSIS — Z79899 Other long term (current) drug therapy: Secondary | ICD-10-CM | POA: Insufficient documentation

## 2021-08-08 DIAGNOSIS — R42 Dizziness and giddiness: Secondary | ICD-10-CM | POA: Diagnosis present

## 2021-08-08 DIAGNOSIS — E876 Hypokalemia: Secondary | ICD-10-CM | POA: Diagnosis not present

## 2021-08-08 LAB — BASIC METABOLIC PANEL
Anion gap: 8 (ref 5–15)
BUN: 8 mg/dL (ref 8–23)
CO2: 21 mmol/L — ABNORMAL LOW (ref 22–32)
Calcium: 7.9 mg/dL — ABNORMAL LOW (ref 8.9–10.3)
Chloride: 104 mmol/L (ref 98–111)
Creatinine, Ser: 0.53 mg/dL (ref 0.44–1.00)
GFR, Estimated: >60 mL/min (ref >60)
Glucose, Bld: 102 mg/dL — ABNORMAL HIGH (ref 70–99)
Potassium: 3.2 mmol/L — ABNORMAL LOW (ref 3.5–5.1)
Sodium: 133 mmol/L — ABNORMAL LOW (ref 135–145)

## 2021-08-08 LAB — URINALYSIS, ROUTINE W REFLEX MICROSCOPIC
Bilirubin Urine: NEGATIVE
Glucose, UA: NEGATIVE mg/dL
Hgb urine dipstick: NEGATIVE
Ketones, ur: NEGATIVE mg/dL
Leukocytes,Ua: NEGATIVE
Nitrite: NEGATIVE
Protein, ur: NEGATIVE mg/dL
Specific Gravity, Urine: 1.003 — ABNORMAL LOW (ref 1.005–1.030)
pH: 5 (ref 5.0–8.0)

## 2021-08-08 LAB — TROPONIN I (HIGH SENSITIVITY)
Troponin I (High Sensitivity): 4 ng/L (ref <18)
Troponin I (High Sensitivity): 4 ng/L (ref <18)

## 2021-08-08 LAB — CBC
HCT: 34.4 % — ABNORMAL LOW (ref 36.0–46.0)
Hemoglobin: 11.3 g/dL — ABNORMAL LOW (ref 12.0–15.0)
MCH: 31.9 pg (ref 26.0–34.0)
MCHC: 32.8 g/dL (ref 30.0–36.0)
MCV: 97.2 fL (ref 80.0–100.0)
Platelets: 430 10*3/uL — ABNORMAL HIGH (ref 150–400)
RBC: 3.54 MIL/uL — ABNORMAL LOW (ref 3.87–5.11)
RDW: 12.4 % (ref 11.5–15.5)
WBC: 12.4 10*3/uL — ABNORMAL HIGH (ref 4.0–10.5)
nRBC: 0 % (ref 0.0–0.2)

## 2021-08-08 LAB — TSH: TSH: 2.465 u[IU]/mL (ref 0.350–4.500)

## 2021-08-08 LAB — T4, FREE: Free T4: 1.05 ng/dL (ref 0.61–1.12)

## 2021-08-08 MED ORDER — SODIUM CHLORIDE 0.9 % IV BOLUS
1000.0000 mL | Freq: Once | INTRAVENOUS | Status: AC
  Administered 2021-08-08: 1000 mL via INTRAVENOUS

## 2021-08-08 MED ORDER — POTASSIUM CHLORIDE CRYS ER 20 MEQ PO TBCR
40.0000 meq | EXTENDED_RELEASE_TABLET | Freq: Once | ORAL | Status: AC
  Administered 2021-08-08: 40 meq via ORAL
  Filled 2021-08-08: qty 2

## 2021-08-08 NOTE — Discharge Instructions (Signed)
Drink plenty of fluids daily.  Return to the ER for worsening symptoms, persistent vomiting, difficulty breathing or other concerns. °

## 2021-08-08 NOTE — ED Notes (Signed)
This RN called this pt's daughter again, pt's daughter stated she will be here in approximately 15 minutes.

## 2021-08-08 NOTE — ED Triage Notes (Signed)
Pt arrived via [facility] from home where she had episode of dizziness while ambulating to bathroom. Pt reported she sat in floor. Pt denies falling, denies LOC, denies hitting head. Pt was found to be hypotensive with EMS at 86/42, given NS with BP rise to 101/64. Pt with recent fall, resulting in rib fractures. Healing bruising noted to pts face from previous fall. Hx/o hypotension.

## 2021-08-08 NOTE — ED Notes (Signed)
This RN got a hold of this pt's daughter Luetta Nutting) and informed her her mother is ready to be picked up. Aforementioned individual stated they were on their way.

## 2021-08-08 NOTE — ED Provider Notes (Signed)
[facility] Provider Note    Event Date/Time   First MD Initiated Contact with Patient 08/08/21 0255     (approximate)   History   Dizziness   HPI  Becky Ward is a 66 y.o. female brought to the ED via EMS from home with a chief complaint of dizziness.  Patient with a history of hypertension, thrombocytosis and colitis who reports getting dizzy while showering.  Had a fall 1 week ago with facial contusions, broken left finger and has been having left rib pain since then.  EMS reports patient orthostatic with SBP in the 80s, increased to 100s with fluids.  Patient denies recent fever, cough, chest pain, shortness of breath, abdominal pain, nausea, vomiting or diarrhea.     Past Medical History   Past Medical History:  Diagnosis Date   Colitis    Hypertension      Active Problem List   Patient Active Problem List   Diagnosis Date Noted   Thrombocytosis 04/24/2021     Past Surgical History   Past Surgical History:  Procedure Laterality Date   NASAL SINUS SURGERY       Home Medications   Prior to Admission medications   Medication Sig Start Date End Date Taking? Authorizing Provider  Aspirin-Acetaminophen-Caffeine [phone] MG PACK Take by mouth as needed.    [provider]  B Complex Vitamins (VITAMIN B COMPLEX) TABS Take 1 tablet by mouth every morning.    [provider]  Cholecalciferol (VITAMIN D3) 10 MCG (400 UNIT) tablet Take by mouth.    [provider]  latanoprost (XALATAN) 0.005 % ophthalmic solution Place 1 drop into both eyes at bedtime.  Patient not taking: Reported on 04/24/2021 02/08/15   [provider]  levETIRAcetam (KEPPRA) 500 MG tablet Take 1 tablet (500 mg total) by mouth 2 (two) times daily for 7 days. Patient not taking: Reported on 04/24/2021 02/04/20 02/11/20  Menshew, Dannielle Karvonen, PA-C  lisinopril-hydrochlorothiazide (PRINZIDE,ZESTORETIC) 20-12.5 MG per tablet Take 1 tablet  by mouth daily. 01/28/15   [provider]  ondansetron (ZOFRAN ODT) 4 MG disintegrating tablet Take 1 tablet (4 mg total) by mouth every 8 (eight) hours as needed. 02/04/20   Menshew, Dannielle Karvonen, PA-C  pravastatin (PRAVACHOL) 20 MG tablet Take 1 tablet by mouth at bedtime. 11/10/19   [provider]     Allergies  Ciprofloxacin, Banana, and Codeine   Family History   Family History  Problem Relation Age of Onset   Leukemia Father      Physical Exam  Triage Vital Signs: ED Triage Vitals [08/08/21 0253]  Enc Vitals Group     BP 124/86     Pulse Rate 72     Resp 18     Temp      Temp src      SpO2 98 %     Weight      Height      Head Circumference      Peak Flow      Pain Score      Pain Loc      Pain Edu?      Excl. in [place]?     Updated Vital Signs: BP 111/84 (BP Location: Left Arm)    Pulse 66    Temp 98.3 F (36.8 C) (Oral)    Resp 16    Ht 5\' 4"  (1.626 m)    Wt 52.2 kg    SpO2 95%  BMI 19.74 kg/m    General: Awake, no distress.  CV:  RRR.  Good peripheral perfusion.  Left ribs tender to palpation.  Mild splinting.  No crepitus. Resp:  Normal effort.  CTAB. Abd:  Nontender.  No distention.  Other:  Old left facial contusions.  Left little finger in splint.  Alert and oriented x3.  CN II to XII grossly intact.  5/5 motor strength and sensation all extremities. MAEx4.   ED Results / Procedures / Treatments  Labs (all labs ordered are listed, but only abnormal results are displayed) Labs Reviewed  BASIC METABOLIC PANEL - Abnormal; Notable for the following components:      Result Value   Sodium 133 (*)    Potassium 3.2 (*)    CO2 21 (*)    Glucose, Bld 102 (*)    Calcium 7.9 (*)    All other components within normal limits  CBC - Abnormal; Notable for the following components:   WBC 12.4 (*)    RBC 3.54 (*)    Hemoglobin 11.3 (*)    HCT 34.4 (*)    Platelets 430 (*)    All other components within normal limits  URINALYSIS, ROUTINE  W REFLEX MICROSCOPIC - Abnormal; Notable for the following components:   Color, Urine STRAW (*)    APPearance CLEAR (*)    Specific Gravity, Urine 1.003 (*)    All other components within normal limits  TSH  T4, FREE  CBG MONITORING, ED  TROPONIN I (HIGH SENSITIVITY)  TROPONIN I (HIGH SENSITIVITY)     EKG  ED ECG REPORT I, Ray Glacken J, the attending physician, personally viewed and interpreted this ECG.   Date: 08/08/2021  EKG Time: 0256  Rate: 69  Rhythm: normal sinus rhythm  Axis: Normal  Intervals:none  ST&T Change: Nonspecific    RADIOLOGY I have personally reviewed patient's CT head, left rib series as well as the radiology interpretation:  CT head: No ICH  Left rib series: No visible rib fracture, no acute cardiopulmonary disease, no pneumothorax  Official radiology report(s): DG Ribs Unilateral W/Chest Left  Result Date: 08/08/2021 CLINICAL DATA:  Fall, left rib pain EXAM: LEFT RIBS AND CHEST - 3+ VIEW COMPARISON:  09/22/2008 FINDINGS: Heart is normal size. No confluent airspace opacities, effusions or pneumothorax. No acute bony abnormality. No visible displaced rib fracture. IMPRESSION: No visible rib fracture. No acute cardiopulmonary disease. Electronically Signed   By: Rolm Baptise M.D.   On: 08/08/2021 03:44   CT Head Wo Contrast  Result Date: 08/08/2021 CLINICAL DATA:  Encephalopathy EXAM: CT HEAD WITHOUT CONTRAST TECHNIQUE: Contiguous axial images were obtained from the base of the skull through the vertex without intravenous contrast. RADIATION DOSE REDUCTION: This exam was performed according to the departmental dose-optimization program which includes automated exposure control, adjustment of the mA and/or kV according to patient size and/or use of iterative reconstruction technique. COMPARISON:  02/04/2020 FINDINGS: Brain: There is no mass, hemorrhage or extra-axial collection. The size and configuration of the ventricles and extra-axial CSF spaces are  normal. The brain parenchyma is normal, without acute or chronic infarction. Unchanged mineralization of the right thalamus. Vascular: No abnormal hyperdensity of the major intracranial arteries or dural venous sinuses. No intracranial atherosclerosis. Skull: The visualized skull base, calvarium and extracranial soft tissues are normal. Sinuses/Orbits: No fluid levels or advanced mucosal thickening of the visualized paranasal sinuses. No mastoid or middle ear effusion. The orbits are normal. IMPRESSION: No acute intracranial abnormality. Electronically Signed   By:  Ulyses Jarred M.D.   On: 08/08/2021 03:41     PROCEDURES:  Critical Care performed: No  .1-3 Lead EKG Interpretation Performed by: Paulette Blanch, MD Authorized by: Paulette Blanch, MD     Interpretation: normal     ECG rate:  70   ECG rate assessment: normal     Rhythm: sinus rhythm     Ectopy: none     Conduction: normal   Comments:     Patient placed on cardiac monitor to evaluate for arrhythmias   MEDICATIONS ORDERED IN ED: Medications  sodium chloride 0.9 % bolus 1,000 mL (0 mLs Intravenous Stopped 08/08/21 0447)  potassium chloride SA (KLOR-CON M) CR tablet 40 mEq (40 mEq Oral Given 08/08/21 0403)     IMPRESSION / MDM / ASSESSMENT AND PLAN / ED COURSE  I reviewed the triage vital signs and the nursing notes.                             66 year old female presenting with dizziness.  Differential diagnosis includes but is not limited to [place], ACS, infectious, metabolic, orthostatic etiologies, etc.  The patient is on the cardiac monitor to evaluate for evidence of arrhythmia and/or significant heart rate changes.  Will obtain cardiac panel, CT head, left rib series.  Continue IV fluid hydration started by EMS.  Will reassess.  Clinical Course as of 08/08/21 0657  Tue Aug 08, 2021  0451 Patient feeling better. Awaiting urine specimen.  Will repeat troponin.  Updated patient on laboratory and imaging results notable for  mild leukocytosis WBC 12.4, mild hypokalemia potassium 3.2, normal thyroid function and troponin.  CT head and x-ray ribs unremarkable. [JS]  0533 Thyroid panel is unremarkable; repeat troponin remains negative.  Awaiting urine specimen. [JS]  [id number] UA is negative.  Patient feeling better, smiling, eager for discharge home.  Strict return precautions given.  Patient verbalizes understanding and agrees with plan of care. [JS]    Clinical Course User Index [JS] Paulette Blanch, MD     FINAL CLINICAL IMPRESSION(S) / ED DIAGNOSES   Final diagnoses:  Dizziness  Hypokalemia     Rx / DC Orders   ED Discharge Orders     None        Note:  This document was prepared using Dragon voice recognition software and may include unintentional dictation errors.   Paulette Blanch, MD 08/08/21 [phone]

## 2021-08-17 DIAGNOSIS — K1321 Leukoplakia of oral mucosa, including tongue: Secondary | ICD-10-CM | POA: Insufficient documentation

## 2021-08-22 DIAGNOSIS — S62617A Displaced fracture of proximal phalanx of left little finger, initial encounter for closed fracture: Secondary | ICD-10-CM | POA: Insufficient documentation

## 2021-11-14 ENCOUNTER — Inpatient Hospital Stay: Payer: Medicare Other

## 2021-11-14 ENCOUNTER — Inpatient Hospital Stay: Payer: Medicare Other | Admitting: Internal Medicine

## 2021-11-17 ENCOUNTER — Inpatient Hospital Stay: Payer: Medicare Other | Attending: Internal Medicine

## 2021-11-17 ENCOUNTER — Inpatient Hospital Stay (HOSPITAL_BASED_OUTPATIENT_CLINIC_OR_DEPARTMENT_OTHER): Payer: Medicare Other | Admitting: Internal Medicine

## 2021-11-17 ENCOUNTER — Encounter: Payer: Self-pay | Admitting: Internal Medicine

## 2021-11-17 VITALS — BP 92/68 | HR 86 | Temp 98.3°F | Resp 16 | Wt 109.6 lb

## 2021-11-17 DIAGNOSIS — R918 Other nonspecific abnormal finding of lung field: Secondary | ICD-10-CM | POA: Diagnosis not present

## 2021-11-17 DIAGNOSIS — G8929 Other chronic pain: Secondary | ICD-10-CM | POA: Insufficient documentation

## 2021-11-17 DIAGNOSIS — M2550 Pain in unspecified joint: Secondary | ICD-10-CM | POA: Diagnosis not present

## 2021-11-17 DIAGNOSIS — I10 Essential (primary) hypertension: Secondary | ICD-10-CM | POA: Diagnosis not present

## 2021-11-17 DIAGNOSIS — F17210 Nicotine dependence, cigarettes, uncomplicated: Secondary | ICD-10-CM | POA: Insufficient documentation

## 2021-11-17 DIAGNOSIS — Z79899 Other long term (current) drug therapy: Secondary | ICD-10-CM | POA: Insufficient documentation

## 2021-11-17 DIAGNOSIS — D75839 Thrombocytosis, unspecified: Secondary | ICD-10-CM | POA: Insufficient documentation

## 2021-11-17 LAB — COMPREHENSIVE METABOLIC PANEL
ALT: 24 U/L (ref 0–44)
AST: 37 U/L (ref 15–41)
Albumin: 3.6 g/dL (ref 3.5–5.0)
Alkaline Phosphatase: 76 U/L (ref 38–126)
Anion gap: 12 (ref 5–15)
BUN: 11 mg/dL (ref 8–23)
CO2: 20 mmol/L — ABNORMAL LOW (ref 22–32)
Calcium: 8.4 mg/dL — ABNORMAL LOW (ref 8.9–10.3)
Chloride: 108 mmol/L (ref 98–111)
Creatinine, Ser: 0.68 mg/dL (ref 0.44–1.00)
GFR, Estimated: >60 mL/min (ref >60)
Glucose, Bld: 108 mg/dL — ABNORMAL HIGH (ref 70–99)
Potassium: 3.7 mmol/L (ref 3.5–5.1)
Sodium: 140 mmol/L (ref 135–145)
Total Bilirubin: 0.2 mg/dL — ABNORMAL LOW (ref 0.3–1.2)
Total Protein: 7.1 g/dL (ref 6.5–8.1)

## 2021-11-17 LAB — CBC WITH DIFFERENTIAL/PLATELET
Abs Immature Granulocytes: 0.06 10*3/uL (ref 0.00–0.07)
Basophils Absolute: 0.1 10*3/uL (ref 0.0–0.1)
Basophils Relative: 1 %
Eosinophils Absolute: 0.3 10*3/uL (ref 0.0–0.5)
Eosinophils Relative: 3 %
HCT: 39.5 % (ref 36.0–46.0)
Hemoglobin: 13.2 g/dL (ref 12.0–15.0)
Immature Granulocytes: 1 %
Lymphocytes Relative: 25 %
Lymphs Abs: 2.6 10*3/uL (ref 0.7–4.0)
MCH: 32.2 pg (ref 26.0–34.0)
MCHC: 33.4 g/dL (ref 30.0–36.0)
MCV: 96.3 fL (ref 80.0–100.0)
Monocytes Absolute: 0.6 10*3/uL (ref 0.1–1.0)
Monocytes Relative: 6 %
Neutro Abs: 6.6 10*3/uL (ref 1.7–7.7)
Neutrophils Relative %: 64 %
Platelets: 510 10*3/uL — ABNORMAL HIGH (ref 150–400)
RBC: 4.1 MIL/uL (ref 3.87–5.11)
RDW: 14.2 % (ref 11.5–15.5)
WBC: 10.3 10*3/uL (ref 4.0–10.5)
nRBC: 0 % (ref 0.0–0.2)

## 2021-11-17 LAB — LACTATE DEHYDROGENASE: LDH: 171 U/L (ref 98–192)

## 2021-11-17 NOTE — Progress Notes (Signed)
[facility] NOTE  Patient Care Team: Cindra Eves, MD as PCP - General (Internal Medicine) Cammie Sickle, MD as Consulting Physician (Oncology)  CHIEF COMPLAINTS/PURPOSE OF CONSULTATION: Thrombocytosis.   HEMATOLOGY HISTORY  # THROMBOCYTOSIS [platelets- 450-500; since 2022; Hb-13; white count-6.8]; MPN work-up negative/BCR ABL negative  #Active smoker/lung cancer screening program-PCP -[facility].    HISTORY OF PRESENTING ILLNESS: Ambulating independently.  Alone.  Becky Ward 66 y.o.  female pleasant patient is here for follow-up elevated platelets.  Patient unfortunately continues to smoke.  She has chronic joint pains.  Continues to have chronic joint pains.  No new shortness of breath or cough.  No strokes.  Review of Systems  Constitutional:  Positive for malaise/fatigue. Negative for chills, diaphoresis, fever and weight loss.  HENT:  Negative for nosebleeds and sore throat.   Eyes:  Negative for double vision.  Respiratory:  Negative for cough, hemoptysis, sputum production, shortness of breath and wheezing.   Cardiovascular:  Negative for chest pain, palpitations, orthopnea and leg swelling.  Gastrointestinal:  Negative for abdominal pain, blood in stool, constipation, diarrhea, heartburn, melena, nausea and vomiting.  Genitourinary:  Negative for dysuria, frequency and urgency.  Musculoskeletal:  Positive for back pain, joint pain and myalgias.  Skin: Negative.  Negative for itching and rash.  Neurological:  Positive for tingling. Negative for dizziness, focal weakness, weakness and headaches.  Endo/Heme/Allergies:  Does not bruise/bleed easily.  Psychiatric/Behavioral:  Negative for depression. The patient is not nervous/anxious and does not have insomnia.     MEDICAL HISTORY:  Past Medical History:  Diagnosis Date   Colitis    Hypertension     SURGICAL HISTORY: Past Surgical History:  Procedure Laterality Date   NASAL SINUS  SURGERY      SOCIAL HISTORY: Social History   Socioeconomic History   Marital status: Single    Spouse name: Not on file   Number of children: Not on file   Years of education: Not on file   Highest education level: Not on file  Occupational History   Not on file  Tobacco Use   Smoking status: Every Day   Smokeless tobacco: Never  Substance and Sexual Activity   Alcohol use: Yes    Comment: daily beer   Drug use: Not Currently   Sexual activity: Not Currently  Other Topics Concern   Not on file  Social History Narrative   Alcohol- 3-4 beers-day/smoking: 1ppd; lives in [place]; 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    Social Determinants of Health   Financial Resource Strain: Not on file  Food Insecurity: Not on file  Transportation Needs: Not on file  Physical Activity: Not on file  Stress: Not on file  Social Connections: Not on file  Intimate Partner Violence: Not on file    FAMILY HISTORY: Family History  Problem Relation Age of Onset   Leukemia Father     ALLERGIES:  is allergic to ciprofloxacin, banana, and codeine.  MEDICATIONS:  Current Outpatient Medications  Medication Sig Dispense Refill   Cholecalciferol (VITAMIN D3) 10 MCG (400 UNIT) tablet Take by mouth.     diazepam (VALIUM) 5 MG tablet Take 5 mg by mouth daily as needed.     lisinopril-hydrochlorothiazide (PRINZIDE,ZESTORETIC) 20-12.5 MG per tablet Take 1 tablet by mouth daily.     Multiple Vitamins-Minerals (MULTI ADULT GUMMIES) CHEW Chew by mouth.     potassium chloride SA (KLOR-CON M) 20 MEQ tablet Take by  mouth.     pravastatin (PRAVACHOL) 20 MG tablet Take 1 tablet by mouth at bedtime.     albuterol (VENTOLIN HFA) 108 (90 Base) MCG/ACT inhaler SMARTSIG:2 Puff(s) By Mouth Every 4 Hours PRN (Patient not taking: Reported on 11/17/2021)     ondansetron (ZOFRAN ODT) 4 MG disintegrating tablet Take 1 tablet (4 mg total) by mouth every 8 (eight)  hours as needed. (Patient not taking: Reported on 11/17/2021) 15 tablet 0   No current facility-administered medications for this visit.     PHYSICAL EXAMINATION:   Vitals:   11/17/21 1448  BP: 92/68  Pulse: 86  Resp: 16  Temp: 98.3 F (36.8 C)  SpO2: 98%   Filed Weights   11/17/21 1448  Weight: 109 lb 9.6 oz (49.7 kg)    Physical Exam Vitals and nursing note reviewed.  HENT:     Head: Normocephalic and atraumatic.     Mouth/Throat:     Pharynx: Oropharynx is clear.  Eyes:     Extraocular Movements: Extraocular movements intact.     Pupils: Pupils are equal, round, and reactive to light.  Cardiovascular:     Rate and Rhythm: Normal rate and regular rhythm.  Pulmonary:     Comments: Decreased breath sounds bilaterally.  Abdominal:     Palpations: Abdomen is soft.  Musculoskeletal:        General: Normal range of motion.     Cervical back: Normal range of motion.  Skin:    General: Skin is warm.  Neurological:     General: No focal deficit present.     Mental Status: She is alert and oriented to person, place, and time.  Psychiatric:        Behavior: Behavior normal.        Judgment: Judgment normal.     LABORATORY DATA:  I have reviewed the data as listed Lab Results  Component Value Date   WBC 10.3 11/17/2021   HGB 13.2 11/17/2021   HCT 39.5 11/17/2021   MCV 96.3 11/17/2021   PLT 510 (H) 11/17/2021   Recent Labs    04/24/21 1130 08/08/21 0255 11/17/21 1420  NA 138 133* 140  K 4.0 3.2* 3.7  CL 104 104 108  CO2 25 21* 20*  GLUCOSE 107* 102* 108*  BUN _0 CREATININE 0.62 0.53 0.68  CALCIUM 8.2* 7.9* 8.4*  GFRNONAA >60 >60 >60  PROT 7.4  --  7.1  ALBUMIN 3.6  --  3.6  AST 41  --  37  ALT 25  --  24  ALKPHOS 132*  --  76  BILITOT 0.5  --  0.2*     No results found.  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 Patient is asymptomatic.  Patient blood work  including JAK2 mutation/CALR/MPL-negative.  BCR ABL is negative.  Peripheral blood review negative for any major concerns. Recommend baby asprin 34m/day.   # MAY 26th, 2023- 510. I suspect patient's thrombocytosis is reactive rather than any malignant process. Again discussed re: Bone marrow biopsy. But hold off for a bone marrow biopsy at this time. Question related to smoking.-See below  # Lung nodules:  Reviewed- [facility] JULY 2022- CT scan- 1.  Numerous bilateral solid and groundglass pulmonary nodules; New 6 mm part solid pulmonary nodule in the right middle lobe.  Defer to PCP; re: compliance with getting follow up scans.   # Active smoker: Discussed with the patient regarding the ill  effects of smoking- including but not limited to cardiac lung and vascular diseases and malignancies.  Again strongly recommend the patient quit smoking.  Again discussed that smoking could because of her elevated platelets.  # DISPOSITION: # follow up 8 months MD; Labs- cbc/cmp/LDH -Dr.B       Cammie Sickle, MD 11/17/2021 3:23 PM

## 2021-11-17 NOTE — Assessment & Plan Note (Addendum)
#  Thrombocytosis MILD [450-500]-question essential thrombocytosis versus reactive. Patient is asymptomatic.  Patient blood work including JAK2 mutation/CALR/MPL-negative.  BCR ABL is negative.  Peripheral blood review negative for any major concerns. Recommend baby asprin $RemoveBef'81mg'aUsFwMteho$ /day.   # MAY 26th, 2023- 510. I suspect patient's thrombocytosis is reactive rather than any malignant process. Again discussed re: Bone marrow biopsy. But hold off for a bone marrow biopsy at this time. Question related to smoking.-See below  # Lung nodules:  Reviewed- [facility] JULY 2022- CT scan- 1. Numerous bilateral solid and groundglass pulmonary nodules; New 6 mm part solid pulmonary nodule in the right middle lobe.  Defer to PCP; re: compliance with getting follow up scans.   # Active smoker: Discussed with the patient regarding the ill effects of smoking- including but not limited to cardiac lung and vascular diseases and malignancies.  Again strongly recommend the patient quit smoking.  Again discussed that smoking could because of her elevated platelets.  # DISPOSITION: # follow up 8 months MD; Labs- cbc/cmp/LDH -Dr.B

## 2021-11-17 NOTE — Progress Notes (Signed)
Pt in for follow up.  Pt reports 'I  just feel nauseous all the time".

## 2022-07-20 ENCOUNTER — Other Ambulatory Visit: Payer: Self-pay | Admitting: *Deleted

## 2022-07-20 ENCOUNTER — Inpatient Hospital Stay: Payer: Medicare Other | Attending: Internal Medicine | Admitting: Internal Medicine

## 2022-07-20 ENCOUNTER — Inpatient Hospital Stay: Payer: Medicare Other

## 2022-07-20 DIAGNOSIS — D75839 Thrombocytosis, unspecified: Secondary | ICD-10-CM

## 2022-07-20 DIAGNOSIS — Z79899 Other long term (current) drug therapy: Secondary | ICD-10-CM | POA: Insufficient documentation

## 2022-07-20 DIAGNOSIS — I10 Essential (primary) hypertension: Secondary | ICD-10-CM | POA: Insufficient documentation

## 2022-07-20 DIAGNOSIS — F17210 Nicotine dependence, cigarettes, uncomplicated: Secondary | ICD-10-CM | POA: Insufficient documentation

## 2022-07-20 LAB — CBC WITH DIFFERENTIAL/PLATELET
Abs Immature Granulocytes: 0.06 10*3/uL (ref 0.00–0.07)
Basophils Absolute: 0 10*3/uL (ref 0.0–0.1)
Basophils Relative: 1 %
Eosinophils Absolute: 0.2 10*3/uL (ref 0.0–0.5)
Eosinophils Relative: 3 %
HCT: 34.4 % — ABNORMAL LOW (ref 36.0–46.0)
Hemoglobin: 11.1 g/dL — ABNORMAL LOW (ref 12.0–15.0)
Immature Granulocytes: 1 %
Lymphocytes Relative: 28 %
Lymphs Abs: 2.1 10*3/uL (ref 0.7–4.0)
MCH: 32.1 pg (ref 26.0–34.0)
MCHC: 32.3 g/dL (ref 30.0–36.0)
MCV: 99.4 fL (ref 80.0–100.0)
Monocytes Absolute: 0.6 10*3/uL (ref 0.1–1.0)
Monocytes Relative: 8 %
Neutro Abs: 4.6 10*3/uL (ref 1.7–7.7)
Neutrophils Relative %: 59 %
Platelets: 431 10*3/uL — ABNORMAL HIGH (ref 150–400)
RBC: 3.46 MIL/uL — ABNORMAL LOW (ref 3.87–5.11)
RDW: 14.3 % (ref 11.5–15.5)
WBC: 7.7 10*3/uL (ref 4.0–10.5)
nRBC: 0 % (ref 0.0–0.2)

## 2022-07-20 LAB — COMPREHENSIVE METABOLIC PANEL
ALT: 82 U/L — ABNORMAL HIGH (ref 0–44)
AST: 42 U/L — ABNORMAL HIGH (ref 15–41)
Albumin: 3.5 g/dL (ref 3.5–5.0)
Alkaline Phosphatase: 95 U/L (ref 38–126)
Anion gap: 11 (ref 5–15)
BUN: 10 mg/dL (ref 8–23)
CO2: 24 mmol/L (ref 22–32)
Calcium: 8.3 mg/dL — ABNORMAL LOW (ref 8.9–10.3)
Chloride: 104 mmol/L (ref 98–111)
Creatinine, Ser: 0.58 mg/dL (ref 0.44–1.00)
GFR, Estimated: >60 mL/min (ref >60)
Glucose, Bld: 97 mg/dL (ref 70–99)
Potassium: 3.8 mmol/L (ref 3.5–5.1)
Sodium: 139 mmol/L (ref 135–145)
Total Bilirubin: 0.3 mg/dL (ref 0.3–1.2)
Total Protein: 7.1 g/dL (ref 6.5–8.1)

## 2022-07-20 LAB — IRON AND TIBC
Iron: 34 ug/dL (ref 28–170)
Saturation Ratios: 8 % — ABNORMAL LOW (ref 10.4–31.8)
TIBC: 407 ug/dL (ref 250–450)
UIBC: 373 ug/dL

## 2022-07-20 LAB — LACTATE DEHYDROGENASE: LDH: 169 U/L (ref 98–192)

## 2022-07-20 LAB — FERRITIN: Ferritin: 70 ng/mL (ref 11–307)

## 2022-07-20 NOTE — Progress Notes (Signed)
[facility] NOTE  Patient Care Team: Cindra Eves, MD as PCP - General (Internal Medicine) Cammie Sickle, MD as Consulting Physician (Oncology)  CHIEF COMPLAINTS/PURPOSE OF CONSULTATION: Thrombocytosis.   HEMATOLOGY HISTORY  # THROMBOCYTOSIS [platelets- 450-500; since 2022; Hb-13; white count-6.8]; MPN work-up negative/BCR ABL negative  #Active smoker/lung cancer screening program-PCP -[facility].    HISTORY OF PRESENTING ILLNESS: Ambulating independently.  Alone.  Becky Ward 67 y.o.  female pleasant patient is here for follow-up elevated platelets.   Appetite is improving. Energy also improving. Arthritis pain is better on prednisone and methotrexate   Patient unfortunately continues to smoke.  She has chronic joint pains.  Continues to have chronic joint pains.  No new shortness of breath or cough.  No strokes.  Review of Systems  Constitutional:  Positive for malaise/fatigue. Negative for chills, diaphoresis, fever and weight loss.  HENT:  Negative for nosebleeds and sore throat.   Eyes:  Negative for double vision.  Respiratory:  Negative for cough, hemoptysis, sputum production, shortness of breath and wheezing.   Cardiovascular:  Negative for chest pain, palpitations, orthopnea and leg swelling.  Gastrointestinal:  Negative for abdominal pain, blood in stool, constipation, diarrhea, heartburn, melena, nausea and vomiting.  Genitourinary:  Negative for dysuria, frequency and urgency.  Musculoskeletal:  Positive for back pain, joint pain and myalgias.  Skin: Negative.  Negative for itching and rash.  Neurological:  Positive for tingling. Negative for dizziness, focal weakness, weakness and headaches.  Endo/Heme/Allergies:  Does not bruise/bleed easily.  Psychiatric/Behavioral:  Negative for depression. The patient is not nervous/anxious and does not have insomnia.      MEDICAL HISTORY:  Past Medical History:  Diagnosis Date   Colitis     Hypertension     SURGICAL HISTORY: Past Surgical History:  Procedure Laterality Date   NASAL SINUS SURGERY      SOCIAL HISTORY: Social History   Socioeconomic History   Marital status: Single    Spouse name: Not on file   Number of children: Not on file   Years of education: Not on file   Highest education level: Not on file  Occupational History   Not on file  Tobacco Use   Smoking status: Every Day   Smokeless tobacco: Never  Substance and Sexual Activity   Alcohol use: Yes    Comment: daily beer   Drug use: Not Currently   Sexual activity: Not Currently  Other Topics Concern   Not on file  Social History Narrative   Alcohol- 3-4 beers-day/smoking: 1ppd; lives in [place]; 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    Social Determinants of Health   Financial Resource Strain: Not on file  Food Insecurity: Not on file  Transportation Needs: Not on file  Physical Activity: Not on file  Stress: Not on file  Social Connections: Not on file  Intimate Partner Violence: Not on file    FAMILY HISTORY: Family History  Problem Relation Age of Onset   Leukemia Father     ALLERGIES:  is allergic to ciprofloxacin, banana, and codeine.  MEDICATIONS:  Current Outpatient Medications  Medication Sig Dispense Refill   b complex vitamins capsule Take 1 capsule by mouth daily.     Cholecalciferol (VITAMIN D3) 10 MCG (400 UNIT) tablet Take by mouth.     folic acid (FOLVITE) 1 MG tablet Take 1 mg by mouth daily.     lisinopril-hydrochlorothiazide (PRINZIDE,ZESTORETIC) 20-12.5 MG per tablet Take 1  tablet by mouth daily.     methotrexate (RHEUMATREX) 2.5 MG tablet Take 2.5 mg by mouth once a week. Take 8 tablets weekly     Multiple Vitamins-Minerals (MULTI ADULT GUMMIES) CHEW Chew by mouth.     nicotine (QC NICOTINE TRANSDERMAL SYSTEM) 14 mg/24hr patch Place 14 mg onto the skin daily.     pravastatin (PRAVACHOL) 20 MG tablet  Take 1 tablet by mouth at bedtime.     albuterol (VENTOLIN HFA) 108 (90 Base) MCG/ACT inhaler SMARTSIG:2 Puff(s) By Mouth Every 4 Hours PRN (Patient not taking: Reported on 11/17/2021)     ondansetron (ZOFRAN ODT) 4 MG disintegrating tablet Take 1 tablet (4 mg total) by mouth every 8 (eight) hours as needed. (Patient not taking: Reported on 11/17/2021) 15 tablet 0   No current facility-administered medications for this visit.     PHYSICAL EXAMINATION:   There were no vitals filed for this visit.  There were no vitals filed for this visit.   Physical Exam Vitals and nursing note reviewed.  HENT:     Head: Normocephalic and atraumatic.     Mouth/Throat:     Pharynx: Oropharynx is clear.  Eyes:     Extraocular Movements: Extraocular movements intact.     Pupils: Pupils are equal, round, and reactive to light.  Cardiovascular:     Rate and Rhythm: Normal rate and regular rhythm.  Pulmonary:     Comments: Decreased breath sounds bilaterally.  Abdominal:     Palpations: Abdomen is soft.  Musculoskeletal:        General: Normal range of motion.     Cervical back: Normal range of motion.  Skin:    General: Skin is warm.  Neurological:     General: No focal deficit present.     Mental Status: She is alert and oriented to person, place, and time.  Psychiatric:        Behavior: Behavior normal.        Judgment: Judgment normal.      LABORATORY DATA:  I have reviewed the data as listed Lab Results  Component Value Date   WBC 7.7 07/20/2022   HGB 11.1 (L) 07/20/2022   HCT 34.4 (L) 07/20/2022   MCV 99.4 07/20/2022   PLT 431 (H) 07/20/2022   Recent Labs    08/08/21 0255 11/17/21 1420 07/20/22 1319  NA 133* 140 139  K 3.2* 3.7 3.8  CL 104 108 104  CO2 21* 20* 24  GLUCOSE 102* 108* 97  BUN 8 11 10   CREATININE 0.53 0.68 0.58  CALCIUM 7.9* 8.4* 8.3*  GFRNONAA >60 >60 >60  PROT  --  7.1 7.1  ALBUMIN  --  3.6 3.5  AST  --  37 42*  ALT  --  24 82*  ALKPHOS  --  76 95   BILITOT  --  0.2* 0.3     No results found.  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 Patient is asymptomatic.  Patient blood work including JAK2 mutation/CALR/MPL-negative.  BCR ABL is negative.  Peripheral blood review negative for any major concerns. Recommend baby asprin 81mg /day.   # JAN 2023- 430-  suspect patient's thrombocytosis is reactive rather than any malignant process. Again discussed re: Bone marrow biopsy. But hold off for a bone marrow biopsy at this time. Question related to smoking.-See below  # Mild anemia: Hb 11.1- colo 3 years ago. Will order iron studies/ferritin.   # RA- on MXT/  prednisone- stable.   # Lung nodules:  Reviewed- [facility] JULY 2022- CT scan- Numerous bilateral solid and groundglass pulmonary nodules; New 6 mm part solid pulmonary nodule in the right middle lobe.  Defer to PCP; re: compliance with getting follow up scans. Getting CXRs with PCP.  # Active smoker: Discussed with the patient regarding the ill effects of smoking- including but not limited to cardiac lung and vascular diseases and malignancies.  Patient is in the process of quit smoking.  Again discussed that smoking could because of her elevated platelets.  # DISPOSITION: # Add iron studies;ferritin to labs from today # follow up 12 months MD; Labs- cbc/cmp/LDH -Dr.B       Earna Coder, MD 07/20/2022 2:20 PM

## 2022-07-20 NOTE — Assessment & Plan Note (Addendum)
#  Thrombocytosis MILD [450-500]-question essential thrombocytosis versus reactive. Patient is asymptomatic.  Patient blood work including JAK2 mutation/CALR/MPL-negative.  BCR ABL is negative.  Peripheral blood review negative for any major concerns. Recommend baby asprin 81mg /day.   # JAN 2023- 430-  suspect patient's thrombocytosis is reactive rather than any malignant process. Again discussed re: Bone marrow biopsy. But hold off for a bone marrow biopsy at this time. Question related to smoking.-See below  # Mild anemia: Hb 11.1- colo 3 years ago. Will order iron studies/ferritin.   # RA- on MXT/ prednisone- stable.   # Lung nodules:  Reviewed- [facility] JULY 2022- CT scan- Numerous bilateral solid and groundglass pulmonary nodules; New 6 mm part solid pulmonary nodule in the right middle lobe.  Defer to PCP; re: compliance with getting follow up scans. Getting CXRs with PCP.  # Active smoker: Discussed with the patient regarding the ill effects of smoking- including but not limited to cardiac lung and vascular diseases and malignancies.  Patient is in the process of quit smoking.  Again discussed that smoking could because of her elevated platelets.  # DISPOSITION: # Add iron studies;ferritin to labs from today # follow up 12 months MD; Labs- cbc/cmp/LDH -Dr.B

## 2022-07-20 NOTE — Progress Notes (Signed)
Pt here for Thrombocytosis. Appetite is improving. Energy also improving. Arthritis pain is better on prednisone and methotrexate.

## 2022-07-24 IMAGING — DX DG RIBS W/ CHEST 3+V*L*
3 series · 3 of 3 positions shown · non-contrast
Comparison: 09/22/2008

CLINICAL DATA: Fall, left rib pain

EXAM:
LEFT RIBS AND CHEST - 3+ VIEW

[chest ap]
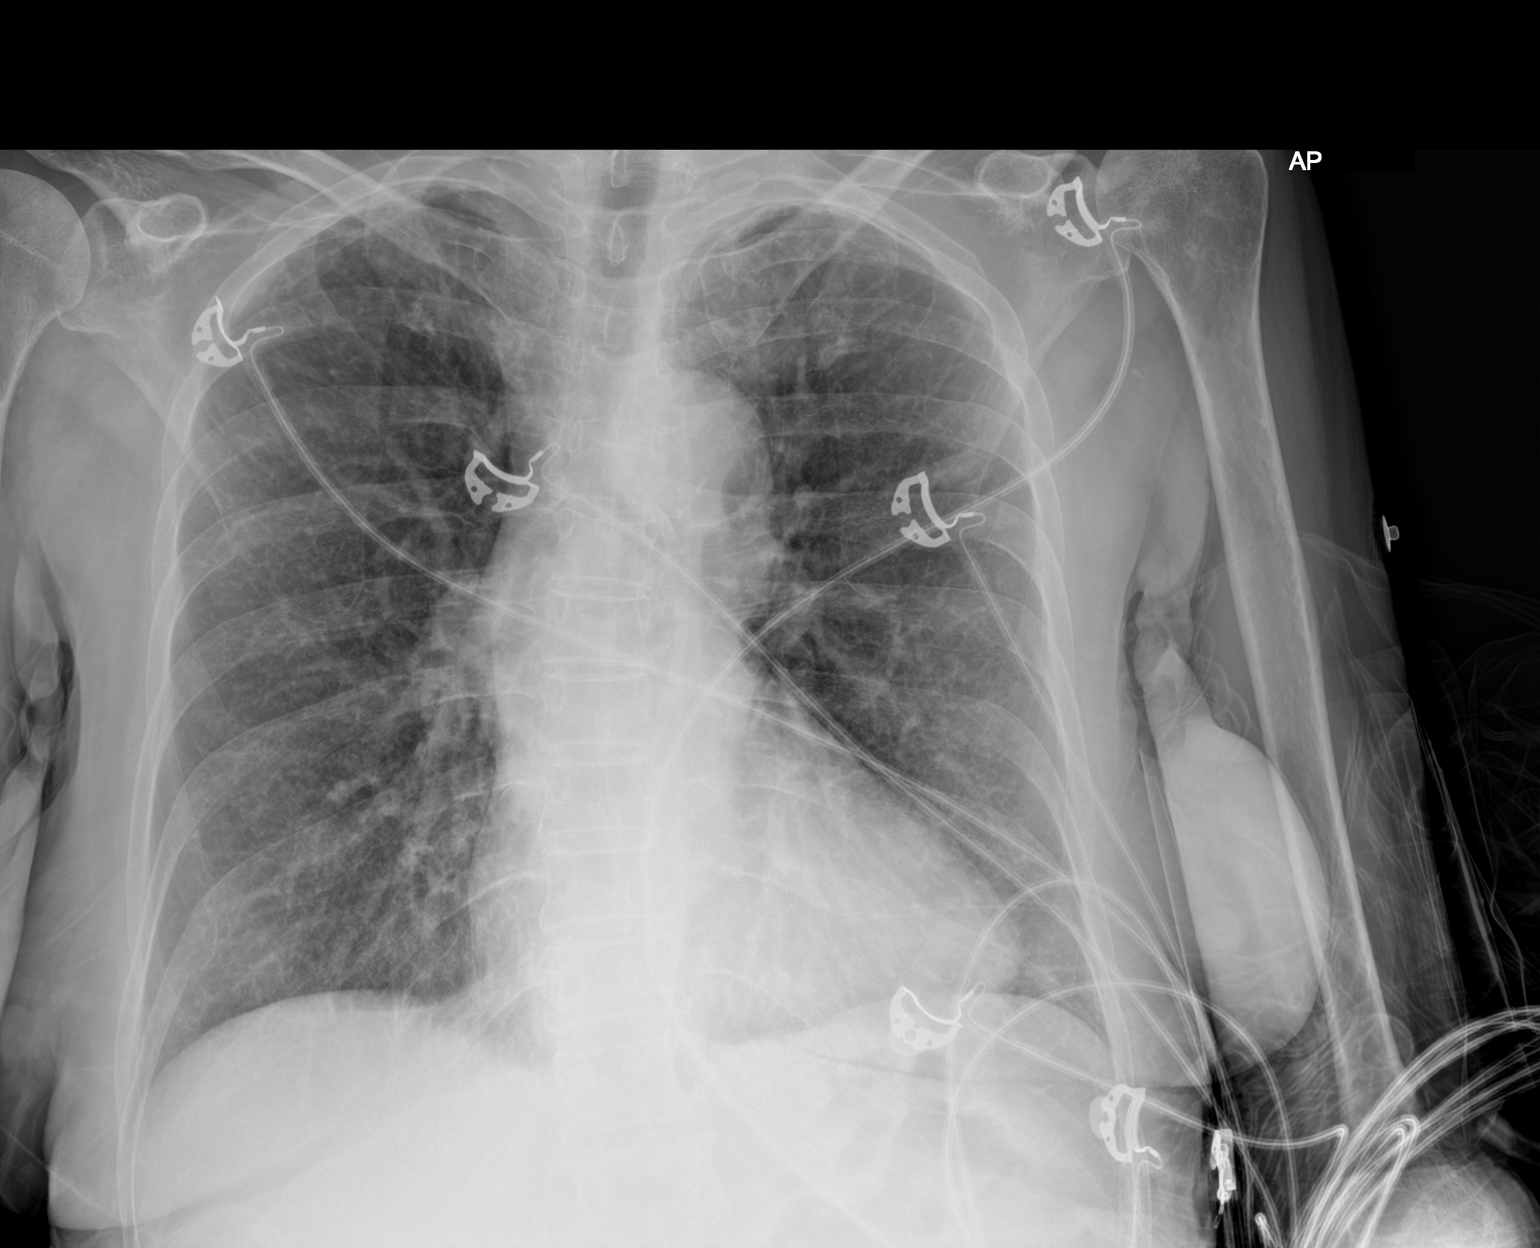

[rib ap]
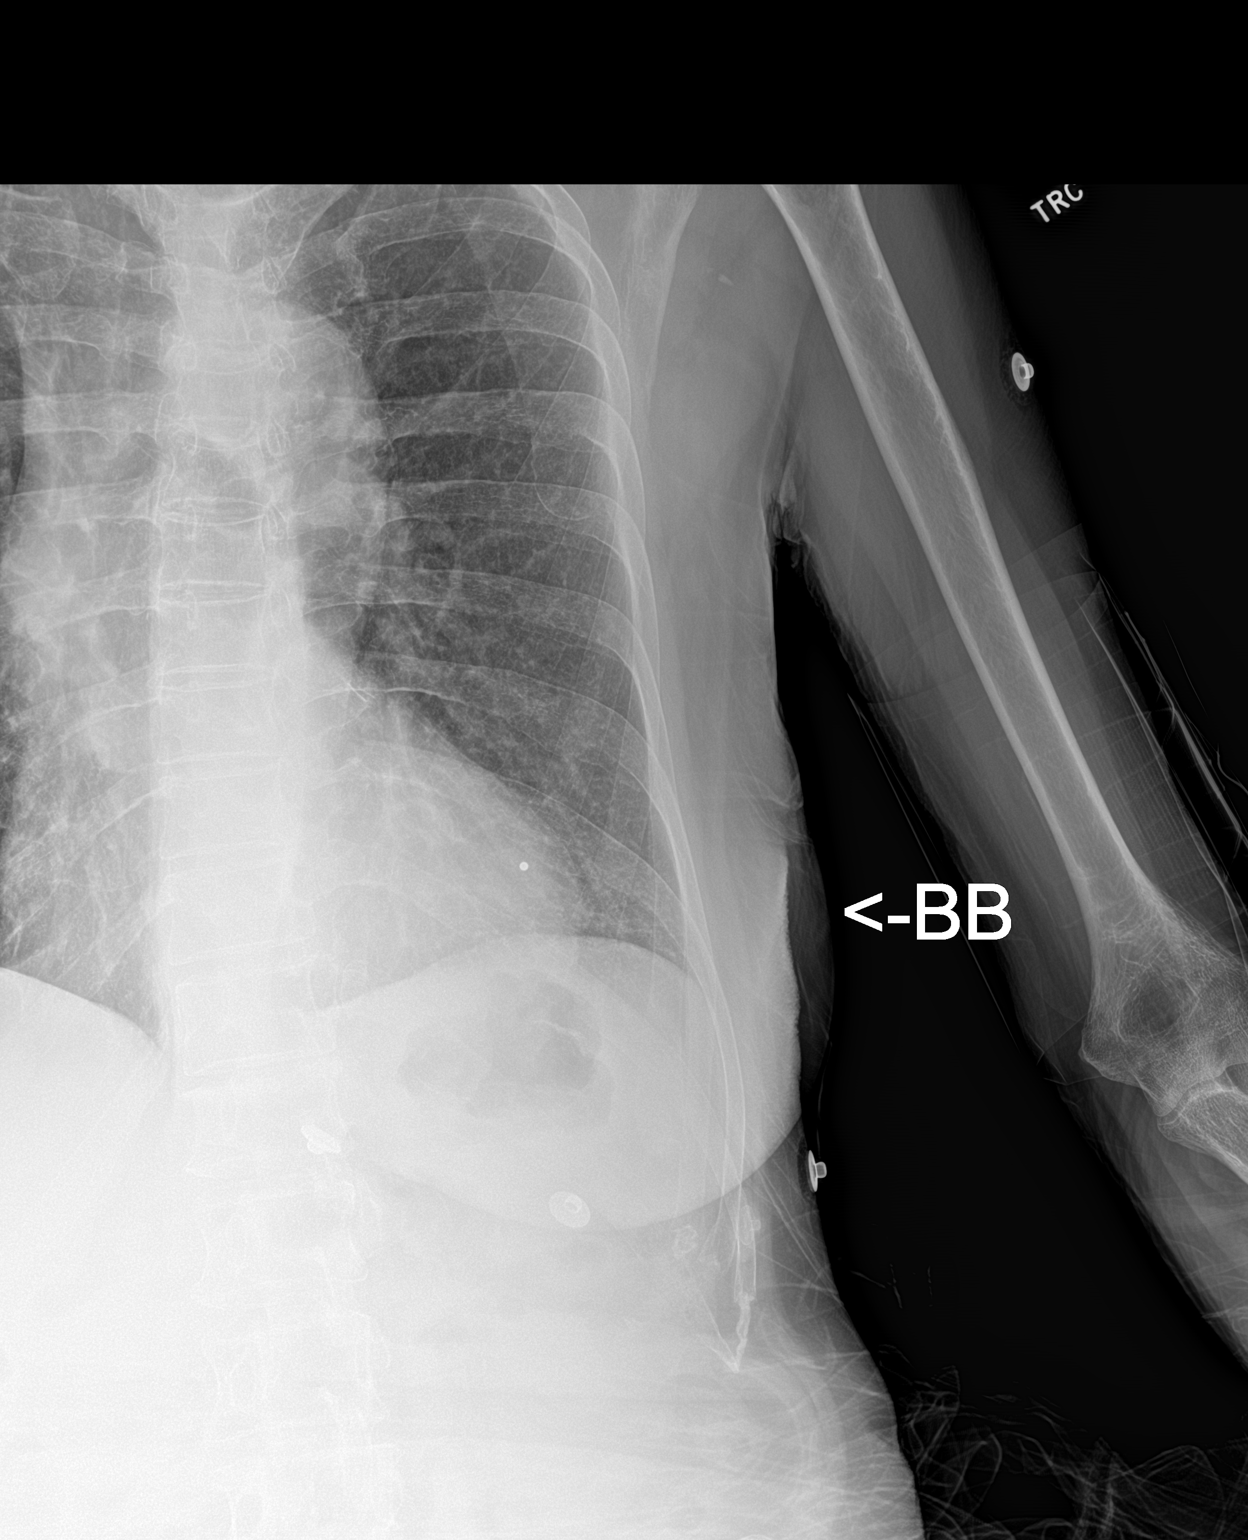

[rib obl]
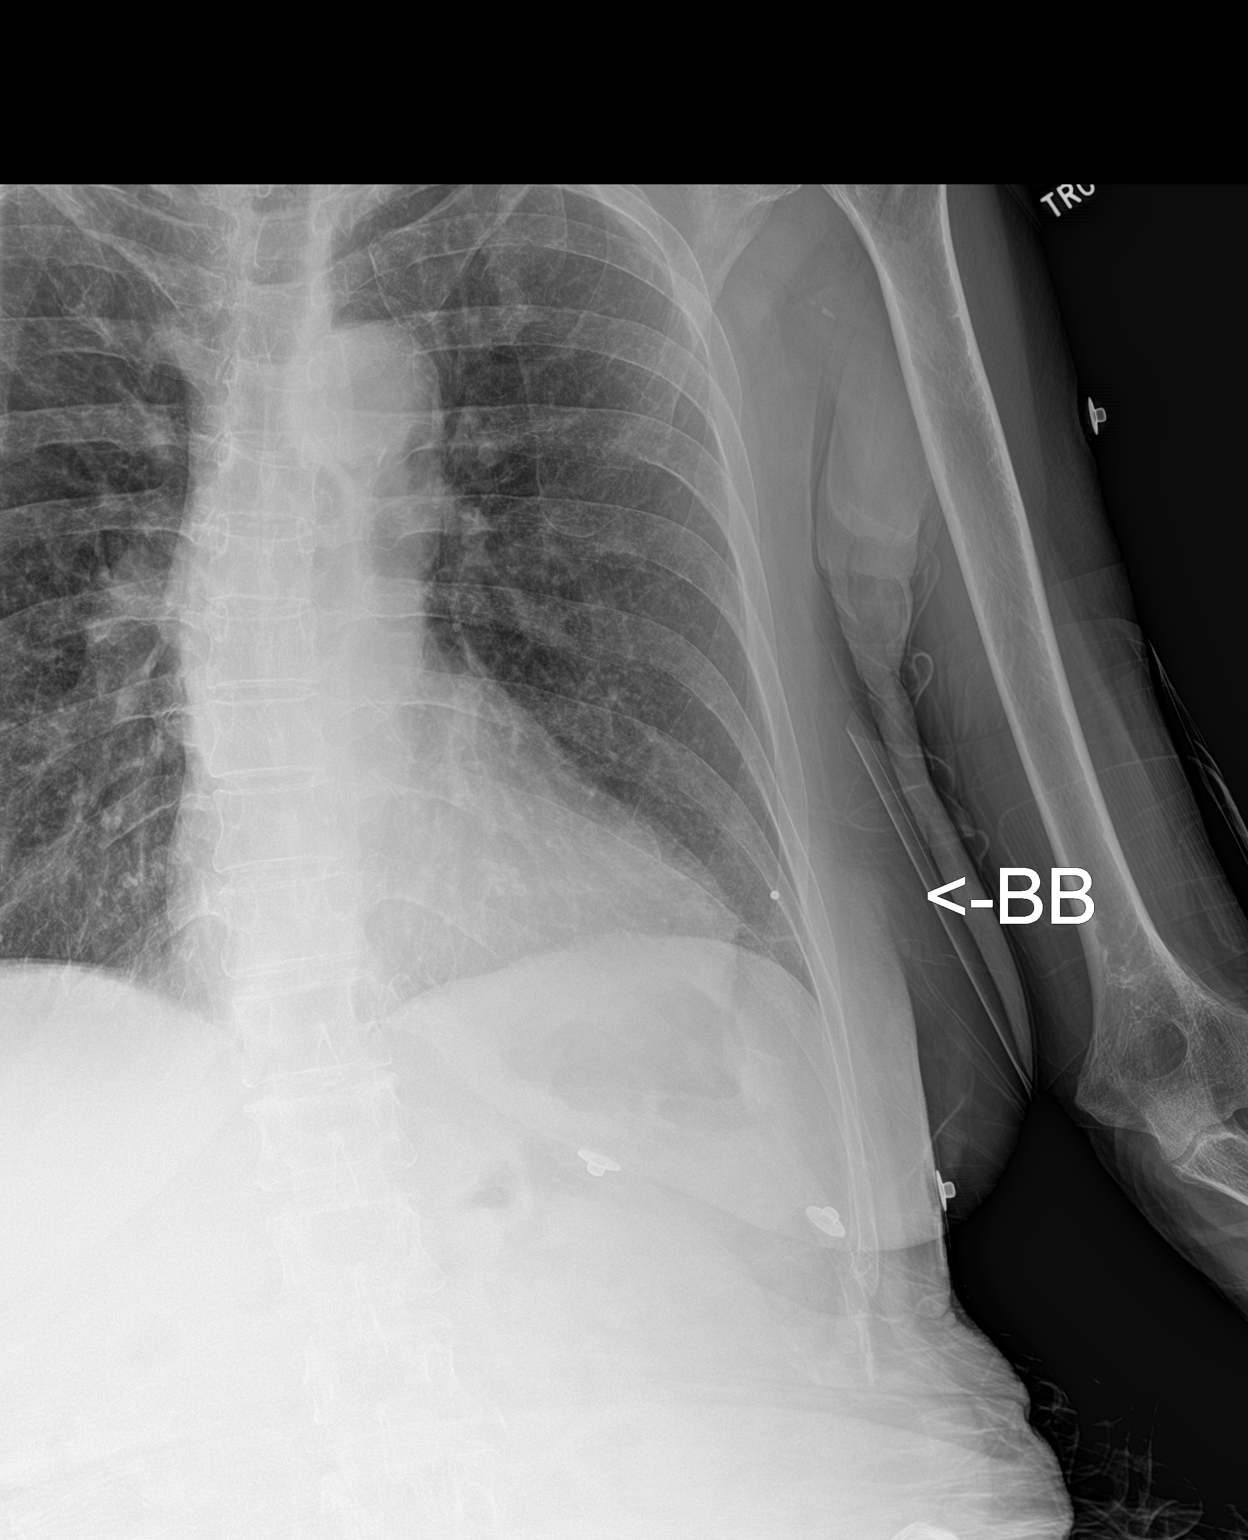

[3 of 3 positions shown; findings below may reference images not displayed]

FINDINGS: Heart is normal size. No confluent airspace opacities, effusions or
pneumothorax. No acute bony abnormality. No visible displaced rib
fracture.
IMPRESSION: No visible rib fracture.

No acute cardiopulmonary disease.

## 2023-01-14 ENCOUNTER — Emergency Department: Payer: Medicare Other

## 2023-01-14 ENCOUNTER — Inpatient Hospital Stay
Admission: EM | Admit: 2023-01-14 | Discharge: 2023-01-23 | Disposition: A | Discharge status: Home Health | Payer: Medicare Other | Attending: Internal Medicine | Admitting: Internal Medicine

## 2023-01-14 ENCOUNTER — Other Ambulatory Visit: Payer: Self-pay

## 2023-01-14 DIAGNOSIS — Z5986 Financial insecurity: Secondary | ICD-10-CM

## 2023-01-14 DIAGNOSIS — Z5941 Food insecurity: Secondary | ICD-10-CM

## 2023-01-14 DIAGNOSIS — Z881 Allergy status to other antibiotic agents status: Secondary | ICD-10-CM | POA: Diagnosis not present

## 2023-01-14 DIAGNOSIS — S42201A Unspecified fracture of upper end of right humerus, initial encounter for closed fracture: Secondary | ICD-10-CM

## 2023-01-14 DIAGNOSIS — Z79631 Long term (current) use of antimetabolite agent: Secondary | ICD-10-CM

## 2023-01-14 DIAGNOSIS — W1839XA Other fall on same level, initial encounter: Secondary | ICD-10-CM | POA: Diagnosis present

## 2023-01-14 DIAGNOSIS — Z5982 Transportation insecurity: Secondary | ICD-10-CM

## 2023-01-14 DIAGNOSIS — S42291A Other displaced fracture of upper end of right humerus, initial encounter for closed fracture: Secondary | ICD-10-CM | POA: Diagnosis present

## 2023-01-14 DIAGNOSIS — Z6821 Body mass index (BMI) 21.0-21.9, adult: Secondary | ICD-10-CM | POA: Diagnosis not present

## 2023-01-14 DIAGNOSIS — M069 Rheumatoid arthritis, unspecified: Secondary | ICD-10-CM | POA: Diagnosis present

## 2023-01-14 DIAGNOSIS — E440 Moderate protein-calorie malnutrition: Secondary | ICD-10-CM | POA: Diagnosis present

## 2023-01-14 DIAGNOSIS — D649 Anemia, unspecified: Secondary | ICD-10-CM | POA: Diagnosis present

## 2023-01-14 DIAGNOSIS — Z885 Allergy status to narcotic agent status: Secondary | ICD-10-CM

## 2023-01-14 DIAGNOSIS — Z91018 Allergy to other foods: Secondary | ICD-10-CM

## 2023-01-14 DIAGNOSIS — Z79899 Other long term (current) drug therapy: Secondary | ICD-10-CM | POA: Diagnosis not present

## 2023-01-14 DIAGNOSIS — E785 Hyperlipidemia, unspecified: Secondary | ICD-10-CM | POA: Diagnosis present

## 2023-01-14 DIAGNOSIS — Y907 Blood alcohol level of 200-239 mg/100 ml: Secondary | ICD-10-CM | POA: Diagnosis present

## 2023-01-14 DIAGNOSIS — F10920 Alcohol use, unspecified with intoxication, uncomplicated: Principal | ICD-10-CM

## 2023-01-14 DIAGNOSIS — Z87892 Personal history of anaphylaxis: Secondary | ICD-10-CM | POA: Diagnosis not present

## 2023-01-14 DIAGNOSIS — F1090 Alcohol use, unspecified, uncomplicated: Secondary | ICD-10-CM

## 2023-01-14 DIAGNOSIS — F10120 Alcohol abuse with intoxication, uncomplicated: Secondary | ICD-10-CM | POA: Diagnosis present

## 2023-01-14 DIAGNOSIS — E876 Hypokalemia: Secondary | ICD-10-CM

## 2023-01-14 DIAGNOSIS — S82851A Displaced trimalleolar fracture of right lower leg, initial encounter for closed fracture: Principal | ICD-10-CM | POA: Diagnosis present

## 2023-01-14 DIAGNOSIS — I959 Hypotension, unspecified: Secondary | ICD-10-CM | POA: Diagnosis present

## 2023-01-14 DIAGNOSIS — I10 Essential (primary) hypertension: Secondary | ICD-10-CM | POA: Diagnosis present

## 2023-01-14 DIAGNOSIS — F17210 Nicotine dependence, cigarettes, uncomplicated: Secondary | ICD-10-CM | POA: Diagnosis present

## 2023-01-14 DIAGNOSIS — W19XXXA Unspecified fall, initial encounter: Secondary | ICD-10-CM

## 2023-01-14 DIAGNOSIS — Z806 Family history of leukemia: Secondary | ICD-10-CM

## 2023-01-14 DIAGNOSIS — F10929 Alcohol use, unspecified with intoxication, unspecified: Secondary | ICD-10-CM

## 2023-01-14 LAB — COMPREHENSIVE METABOLIC PANEL
ALT: 21 U/L (ref 0–44)
AST: 30 U/L (ref 15–41)
Albumin: 4.1 g/dL (ref 3.5–5.0)
Alkaline Phosphatase: 56 U/L (ref 38–126)
Anion gap: 13 (ref 5–15)
BUN: 10 mg/dL (ref 8–23)
CO2: 19 mmol/L — ABNORMAL LOW (ref 22–32)
Calcium: 8.2 mg/dL — ABNORMAL LOW (ref 8.9–10.3)
Chloride: 106 mmol/L (ref 98–111)
Creatinine, Ser: 0.74 mg/dL (ref 0.44–1.00)
GFR, Estimated: >60 mL/min (ref >60)
Glucose, Bld: 104 mg/dL — ABNORMAL HIGH (ref 70–99)
Potassium: 3.3 mmol/L — ABNORMAL LOW (ref 3.5–5.1)
Sodium: 138 mmol/L (ref 135–145)
Total Bilirubin: 0.3 mg/dL (ref 0.3–1.2)
Total Protein: 6.7 g/dL (ref 6.5–8.1)

## 2023-01-14 LAB — CBC WITH DIFFERENTIAL/PLATELET
Abs Immature Granulocytes: 0.05 10*3/uL (ref 0.00–0.07)
Basophils Absolute: 0.1 10*3/uL (ref 0.0–0.1)
Basophils Relative: 1 %
Eosinophils Absolute: 0.2 10*3/uL (ref 0.0–0.5)
Eosinophils Relative: 1 %
HCT: 32.8 % — ABNORMAL LOW (ref 36.0–46.0)
Hemoglobin: 10.8 g/dL — ABNORMAL LOW (ref 12.0–15.0)
Immature Granulocytes: 0 %
Lymphocytes Relative: 19 %
Lymphs Abs: 2.7 10*3/uL (ref 0.7–4.0)
MCH: 32.5 pg (ref 26.0–34.0)
MCHC: 32.9 g/dL (ref 30.0–36.0)
MCV: 98.8 fL (ref 80.0–100.0)
Monocytes Absolute: 0.5 10*3/uL (ref 0.1–1.0)
Monocytes Relative: 4 %
Neutro Abs: 10.3 10*3/uL — ABNORMAL HIGH (ref 1.7–7.7)
Neutrophils Relative %: 75 %
Platelets: 483 10*3/uL — ABNORMAL HIGH (ref 150–400)
RBC: 3.32 MIL/uL — ABNORMAL LOW (ref 3.87–5.11)
RDW: 13.7 % (ref 11.5–15.5)
WBC: 13.8 10*3/uL — ABNORMAL HIGH (ref 4.0–10.5)
nRBC: 0 % (ref 0.0–0.2)

## 2023-01-14 LAB — PROTIME-INR
INR: 1.2 (ref 0.8–1.2)
Prothrombin Time: 15 seconds (ref 11.4–15.2)

## 2023-01-14 LAB — ETHANOL: Alcohol, Ethyl (B): 215 mg/dL — ABNORMAL HIGH (ref <10)

## 2023-01-14 LAB — CBG MONITORING, ED: Glucose-Capillary: 97 mg/dL (ref 70–99)

## 2023-01-14 LAB — TYPE AND SCREEN
ABO/RH(D): A POS
Antibody Screen: NEGATIVE

## 2023-01-14 MED ORDER — FOLIC ACID 1 MG PO TABS
1.0000 mg | ORAL_TABLET | Freq: Every day | ORAL | Status: DC
  Administered 2023-01-16 – 2023-01-23 (×8): 1 mg via ORAL
  Filled 2023-01-14 (×8): qty 1

## 2023-01-14 MED ORDER — MORPHINE SULFATE (PF) 2 MG/ML IV SOLN
2.0000 mg | INTRAVENOUS | Status: DC | PRN
  Administered 2023-01-15 – 2023-01-21 (×26): 2 mg via INTRAVENOUS
  Filled 2023-01-14 (×27): qty 1

## 2023-01-14 MED ORDER — SODIUM CHLORIDE 0.9 % IV BOLUS
1000.0000 mL | Freq: Once | INTRAVENOUS | Status: AC
  Administered 2023-01-14: 1000 mL via INTRAVENOUS

## 2023-01-14 MED ORDER — MORPHINE SULFATE (PF) 4 MG/ML IV SOLN
4.0000 mg | Freq: Once | INTRAVENOUS | Status: AC
  Administered 2023-01-14: 4 mg via INTRAVENOUS
  Filled 2023-01-14: qty 1

## 2023-01-14 MED ORDER — SENNOSIDES-DOCUSATE SODIUM 8.6-50 MG PO TABS
1.0000 | ORAL_TABLET | Freq: Every evening | ORAL | Status: DC | PRN
  Administered 2023-01-23: 1 via ORAL
  Filled 2023-01-14: qty 1

## 2023-01-14 MED ORDER — THIAMINE HCL 100 MG/ML IJ SOLN
100.0000 mg | Freq: Every day | INTRAMUSCULAR | Status: DC
  Filled 2023-01-14: qty 2

## 2023-01-14 MED ORDER — KETAMINE HCL 50 MG/5ML IJ SOSY
50.0000 mg | PREFILLED_SYRINGE | Freq: Once | INTRAMUSCULAR | Status: AC
  Administered 2023-01-14: 50 mg via INTRAVENOUS
  Filled 2023-01-14: qty 5

## 2023-01-14 MED ORDER — LORAZEPAM 1 MG PO TABS: 1.0000 mg | ORAL_TABLET | ORAL | Status: AC | PRN

## 2023-01-14 MED ORDER — FENTANYL CITRATE PF 50 MCG/ML IJ SOSY
50.0000 ug | PREFILLED_SYRINGE | Freq: Once | INTRAMUSCULAR | Status: AC
  Administered 2023-01-14: 50 ug via INTRAVENOUS
  Filled 2023-01-14: qty 1

## 2023-01-14 MED ORDER — HYDROCODONE-ACETAMINOPHEN 5-325 MG PO TABS
1.0000 | ORAL_TABLET | Freq: Four times a day (QID) | ORAL | Status: DC | PRN
  Administered 2023-01-15 – 2023-01-16 (×4): 2 via ORAL
  Filled 2023-01-14 (×6): qty 2

## 2023-01-14 MED ORDER — ONDANSETRON HCL 4 MG/2ML IJ SOLN
4.0000 mg | Freq: Once | INTRAMUSCULAR | Status: AC
  Administered 2023-01-14: 4 mg via INTRAVENOUS
  Filled 2023-01-14: qty 2

## 2023-01-14 MED ORDER — THIAMINE MONONITRATE 100 MG PO TABS
100.0000 mg | ORAL_TABLET | Freq: Every day | ORAL | Status: DC
  Administered 2023-01-16 – 2023-01-23 (×8): 100 mg via ORAL
  Filled 2023-01-14 (×8): qty 1

## 2023-01-14 MED ORDER — ADULT MULTIVITAMIN W/MINERALS CH: 1.0000 | ORAL_TABLET | Freq: Every day | ORAL | Status: DC

## 2023-01-14 MED ORDER — LORAZEPAM 2 MG/ML IJ SOLN: 1.0000 mg | INTRAMUSCULAR | Status: AC | PRN

## 2023-01-14 NOTE — Assessment & Plan Note (Signed)
Alcohol intoxication EtOH level was 214 CIWA withdrawal protocol

## 2023-01-14 NOTE — ED Notes (Signed)
RAINBOW sent on pt

## 2023-01-14 NOTE — Assessment & Plan Note (Signed)
Hemoglobin 10.8 near baseline of 11.1

## 2023-01-14 NOTE — Assessment & Plan Note (Signed)
Normotensive to soft Hold antihypertensives

## 2023-01-14 NOTE — Assessment & Plan Note (Signed)
Management as above Orthopedist, Dr. Signa Kell consulted Maintain sling as recommended from the ED

## 2023-01-14 NOTE — ED Provider Notes (Signed)
[facility] Provider Note    Event Date/Time   First MD Initiated Contact with Patient 01/14/23 1843     (approximate)   History   Fall and Alcohol Intoxication   HPI  Becky Ward is a 67 y.o. female  here with fall. Pt is intoxicated but per report, says she was walking her dogs when they ran after another dog, pulling her down. She landed on her R side and has had pain along her R shoulder, R ankle since then. She has been unable to walk. She did hit her head but denies LOC. She states she feels "weak" but otherwise has not been ill recently. Denies blood thinner use.        Physical Exam   Triage Vital Signs: ED Triage Vitals  Encounter Vitals Group     BP 01/14/23 1835 (!) 68/50     Systolic BP Percentile --      Diastolic BP Percentile --      Pulse Rate 01/14/23 1835 (!) 55     Resp 01/14/23 1835 16     Temp 01/14/23 1835 98.2 F (36.8 C)     Temp src --      SpO2 01/14/23 1835 96 %     Weight 01/14/23 1834 115 lb (52.2 kg)     Height 01/14/23 1834 5\' 3"  (1.6 m)     Head Circumference --      Peak Flow --      Pain Score --      Pain Loc --      Pain Education --      Exclude from Growth Chart --     Most recent vital signs: Vitals:   01/14/23 2130 01/14/23 2300  BP: 115/77 132/76  Pulse: (!) 46 78  Resp: 17 16  Temp:    SpO2: 96% 97%     General: Awake, no distress.  CV:  Good peripheral perfusion. RRR. Resp:  Normal work of breathing. Lungs clear. Abd:  No distention. No tenderness. No rebound or guarding. Other:  Superficial ecchymoses to R temporal area and forehead w/o major edema or step off. Significant swelling and bruising to R upper arm along humeral head. Strength 5/5 bl UE and normal sensation to light touch. Deformity, bruising, and ecchymoses to R ankle with marked tenderness upon any palpation. No wounds.   ED Results / Procedures / Treatments   Labs (all labs ordered are listed, but only abnormal  results are displayed) Labs Reviewed  CBC WITH DIFFERENTIAL/PLATELET - Abnormal; Notable for the following components:      Result Value   WBC 13.8 (*)    RBC 3.32 (*)    Hemoglobin 10.8 (*)    HCT 32.8 (*)    Platelets 483 (*)    Neutro Abs 10.3 (*)    All other components within normal limits  COMPREHENSIVE METABOLIC PANEL - Abnormal; Notable for the following components:   Potassium 3.3 (*)    CO2 19 (*)    Glucose, Bld 104 (*)    Calcium 8.2 (*)    All other components within normal limits  ETHANOL - Abnormal; Notable for the following components:   Alcohol, Ethyl (B) 215 (*)    All other components within normal limits  PROTIME-INR  CBC WITH DIFFERENTIAL/PLATELET  VITAMIN D 25 HYDROXY (VIT D DEFICIENCY, FRACTURES)  HIV ANTIBODY (ROUTINE TESTING W REFLEX)  CBG MONITORING, ED  TYPE AND SCREEN     EKG Normal sinus  rhythm, VR 59. PR 180, QRS 136, Qtc 462. No ST elevations or depressions. No ischemia or infarct.   RADIOLOGY CT Head/C Spine: Negative CT Face: Negative CXR: Negative PXR: Negative Ankle Right: Trimall frx with posterior displacement TIb FIb Right: No proximal tib fib injuries Humerus Right: Humeral head fx   I also independently reviewed and agree with radiologist interpretations.   PROCEDURES:  Critical Care performed: No  .Sedation  Date/Time: 01/14/2023 11:27 PM  Performed by: Shaune Pollack, MD Authorized by: Shaune Pollack, MD   Consent:    Consent obtained:  Written   Consent given by:  Patient   Risks discussed:  Dysrhythmia, inadequate sedation, nausea, vomiting, respiratory compromise necessitating ventilatory assistance and intubation, allergic reaction, prolonged hypoxia resulting in organ damage and prolonged sedation necessitating reversal   Alternatives discussed:  Analgesia without sedation Universal protocol:    Immediately prior to procedure, a time out was called: yes   Indications:    Procedure performed:  Dislocation  reduction   Procedure necessitating sedation performed by:  Physician performing sedation Pre-sedation assessment:    Time since last food or drink:  4   NPO status caution: urgency dictates proceeding with non-ideal NPO status     ASA classification: class 2 - patient with mild systemic disease     Mouth opening:  3 or more finger widths   Mallampati score:  I - soft palate, uvula, fauces, pillars visible   Neck mobility: normal     Pre-sedation assessments completed and reviewed: airway patency, cardiovascular function, hydration status, mental status, nausea/vomiting, pain level, respiratory function and temperature   Immediate pre-procedure details:    Reassessment: Patient reassessed immediately prior to procedure     Reviewed: vital signs     Verified: bag valve mask available, emergency equipment available, intubation equipment available, IV patency confirmed and suction available   Procedure details (see MAR for exact dosages):    Preoxygenation:  Nasal cannula   Sedation:  Ketamine   Intended level of sedation: deep   Analgesia:  Fentanyl   Intra-procedure monitoring:  Blood pressure monitoring, cardiac monitor, continuous capnometry, continuous pulse oximetry, frequent LOC assessments and frequent vital sign checks   Intra-procedure events: none     Total Provider sedation time (minutes):  15 Post-procedure details:    Attendance: Constant attendance by certified staff until patient recovered     Recovery: Patient returned to pre-procedure baseline     Post-sedation assessments completed and reviewed: airway patency, cardiovascular function, hydration status, mental status, nausea/vomiting, pain level, respiratory function and temperature     Patient is stable for discharge or admission: yes     Procedure completion:  Tolerated well, no immediate complications .Ortho Injury Treatment  Date/Time: 01/14/2023 11:28 PM  Performed by: Shaune Pollack, MD Authorized by: Shaune Pollack, MD   Consent:    Consent obtained:  Written   Consent given by:  Patient   Risks discussed:  Fracture, irreducible dislocation, recurrent dislocation, nerve damage, restricted joint movement, stiffness and vascular damage   Alternatives discussed:  Alternative treatmentInjury location: ankle Location details: right ankle Injury type: fracture-dislocation Fracture type: trimalleolar Pre-procedure neurovascular assessment: neurovascularly intact Pre-procedure distal perfusion: normal Pre-procedure neurological function: normal Pre-procedure range of motion: reduced  Patient sedated: Yes. Refer to sedation procedure documentation for details of sedation. Manipulation performed: yes Reduction successful: yes X-ray confirmed reduction: yes Immobilization: splint Splint type: short leg Splint Applied by: ED Provider Supplies used: Ortho-Glass Post-procedure neurovascular assessment: post-procedure neurovascularly intact Post-procedure distal  perfusion: normal Post-procedure neurological function: normal Post-procedure range of motion: unchanged       MEDICATIONS ORDERED IN ED: Medications  LORazepam (ATIVAN) tablet 1-4 mg (has no administration in time range)    Or  LORazepam (ATIVAN) injection 1-4 mg (has no administration in time range)  thiamine (VITAMIN B1) tablet 100 mg (has no administration in time range)    Or  thiamine (VITAMIN B1) injection 100 mg (has no administration in time range)  folic acid (FOLVITE) tablet 1 mg (has no administration in time range)  multivitamin with minerals tablet 1 tablet (has no administration in time range)  HYDROcodone-acetaminophen (NORCO/VICODIN) 5-325 MG per tablet 1-2 tablet (has no administration in time range)  morphine (PF) 2 MG/ML injection 2 mg (has no administration in time range)  senna-docusate (Senokot-S) tablet 1 tablet (has no administration in time range)  sodium chloride 0.9 % bolus 1,000 mL (0 mLs Intravenous  Stopped 01/14/23 1947)  sodium chloride 0.9 % bolus 1,000 mL (0 mLs Intravenous Stopped 01/14/23 2108)  fentaNYL (SUBLIMAZE) injection 50 mcg (50 mcg Intravenous Given 01/14/23 1951)  ondansetron (ZOFRAN) injection 4 mg (4 mg Intravenous Given 01/14/23 1951)  ketamine 50 mg in normal saline 5 mL (10 mg/mL) syringe (50 mg Intravenous Given 01/14/23 2041)  ketamine 50 mg in normal saline 5 mL (10 mg/mL) syringe (50 mg Intravenous Given 01/14/23 2045)  morphine (PF) 4 MG/ML injection 4 mg (4 mg Intravenous Given 01/14/23 2116)  ondansetron (ZOFRAN) injection 4 mg (4 mg Intravenous Given 01/14/23 2115)  morphine (PF) 4 MG/ML injection 4 mg (4 mg Intravenous Given 01/14/23 2214)     IMPRESSION / MDM / ASSESSMENT AND PLAN / ED COURSE  I reviewed the triage vital signs and the nursing notes.                              Differential diagnosis includes, but is not limited to, fall, fracture/dislocation,   Patient's presentation is most consistent with acute presentation with potential threat to life or bodily function.  The patient is on the cardiac monitor to evaluate for evidence of arrhythmia and/or significant heart rate changes  67 yo F here with fall, alcohol intoxication. No signs of head injury. Re: fall - pt has trimal fx right ankle and R humeral head fx. Labs overall reassuring. EtOH >200.  Trimal fx - pt consented and reduction attempted, with slight improvement in pain and alignment. She was very unstable after reduction and suspect will need surgical management. Discussed with Podiatry, recommend admission, ice behind knee overnight with elevation, NPO at MN.  Humerus fx - sling, Dr. Allena Katz consulted, likely non op.    FINAL CLINICAL IMPRESSION(S) / ED DIAGNOSES   Final diagnoses:  Alcoholic intoxication without complication (HCC)  Fall, initial encounter  Closed trimalleolar fracture of right ankle, initial encounter  Other closed displaced fracture of proximal end of right humerus,  initial encounter     Rx / DC Orders   ED Discharge Orders     None        Note:  This document was prepared using Dragon voice recognition software and may include unintentional dictation errors.   Shaune Pollack, MD 01/14/23 2337

## 2023-01-14 NOTE — Assessment & Plan Note (Addendum)
S/p fracture reduction in the ED 4 OR in the a.m. Pain control N.p.o. from midnight Podiatrist, Dr. Excell Seltzer aware -- asked that pt keeps leg elevated overnight with ice behind the knee to help with swelling

## 2023-01-14 NOTE — Assessment & Plan Note (Signed)
Oral KCl 40 mEq p.o. x 1

## 2023-01-14 NOTE — ED Triage Notes (Signed)
Pt to ED via family member, family member reports pt drinking excessively today pt then had a fall onto her right side and hit her head. Unknown if she is on blood thinners but pt has had vomiting episode since and has been altered, unsure if this is due to fall or alcohol intoxication. Pt only grunting in triage.

## 2023-01-14 NOTE — H&P (Signed)
History and Physical    Patient: Becky Ward Becky Ward DOB: October 11, 1955 DOA: 01/14/2023 DOS: the patient was seen and examined on 01/14/2023 PCP: Margit Hanks, MD  Patient coming from: Home  Chief Complaint:  Chief Complaint  Patient presents with   Fall   Alcohol Intoxication    HPI: Becky Ward is a 67 y.o. female with medical history significant for 67 year old female with a history of hypertension, alcohol use disorder and nicotine dependence who presents to the ED via EMS following a fall onto her right side while walking her dogs, after her dogs ran into another dog.  She landed on her right shoulder with immediate pain of the right shoulder and twisted her ankle and has been unable to walk since the incident.  She hit her head but denies loss of consciousness. ED course and data review: Hypotensive on arrival at 68/50, fluid responsive to 115/77.  Intermittent bradycardia to the high 40s and low 50s but for the most part in the 60s to 70s.  Vitals otherwise within normal limits.  Labs notable for EtOH level of 215, potassium 3.3.  WBC 13,000, hemoglobin 10.8. EKG, personally viewed and interpreted showing sinus rhythm at 59 with nonspecific ST-T wave changes. Extensive trauma imaging to include CT head, C-spine, maxillofacial and right ankle as well as x-rays of the right humerus right tib-fib right ankle, pelvis and chest x-ray Significant findings include of right trimalleolar fracture and a right proximal humerus fracture. The ED provider spoke with orthopedist, Dr. Signa Kell regarding the humeral fracture and he recommended placing it in a sling ED provider spoke with podiatrist, Dr. Excell Seltzer regarding the right trimalleolar fracture and he advised that he will take patient to the OR in the a.m. Patient underwent trimalleolar dislocation reduction in the ED under conscious sedation with ketamine. She was additionally treated with NS boluses morphine and  Zofran Hospitalist consulted for admission.   Review of Systems: As mentioned in the history of present illness. All other systems reviewed and are negative.  Past Medical History:  Diagnosis Date   Colitis    Hypertension    Past Surgical History:  Procedure Laterality Date   NASAL SINUS SURGERY     Social History:  reports that she has been smoking. She has never used smokeless tobacco. She reports current alcohol use. She reports that she does not currently use drugs.  Allergies  Allergen Reactions   Ciprofloxacin Rash   Banana Hives   Codeine Rash    Family History  Problem Relation Age of Onset   Leukemia Father     Prior to Admission medications   Medication Sig Start Date End Date Taking? Authorizing Provider  albuterol (VENTOLIN HFA) 108 (90 Base) MCG/ACT inhaler SMARTSIG:2 Puff(s) By Mouth Every 4 Hours PRN Patient not taking: Reported on 11/17/2021 07/01/21   [provider]  b complex vitamins capsule Take 1 capsule by mouth daily.    [provider]  Cholecalciferol (VITAMIN D3) 10 MCG (400 UNIT) tablet Take by mouth.    [provider]  folic acid (FOLVITE) 1 MG tablet Take 1 mg by mouth daily. 06/11/22 06/11/23  [provider]  lisinopril-hydrochlorothiazide (PRINZIDE,ZESTORETIC) 20-12.5 MG per tablet Take 1 tablet by mouth daily. 01/28/15   [provider]  methotrexate (RHEUMATREX) 2.5 MG tablet Take 2.5 mg by mouth once a week. Take 8 tablets weekly 06/11/22   [provider]  Multiple Vitamins-Minerals (MULTI ADULT GUMMIES) CHEW Chew by mouth.    [provider]  nicotine (QC NICOTINE TRANSDERMAL SYSTEM) 14 mg/24hr patch Place 14 mg onto the skin daily.    [provider]  ondansetron (ZOFRAN ODT) 4 MG disintegrating tablet Take 1 tablet (4 mg total) by mouth every 8 (eight) hours as needed. Patient not taking: Reported on 11/17/2021 02/04/20   Menshew, Charlesetta Ivory, PA-C  pravastatin  (PRAVACHOL) 20 MG tablet Take 1 tablet by mouth at bedtime. 11/10/19   [provider]    Physical Exam: Vitals:   01/14/23 2100 01/14/23 2105 01/14/23 2115 01/14/23 2130  BP: 120/80 107/82 119/85 115/77  Pulse: 71 71 67 (!) 46  Resp: 17 17 (!) 23 17  Temp:      TempSrc:      SpO2: 99% 99% 100% 96%  Weight:      Height:       Physical Exam Vitals and nursing note reviewed.  Constitutional:      General: She is not in acute distress. HENT:     Head: Normocephalic and atraumatic.  Cardiovascular:     Rate and Rhythm: Normal rate and regular rhythm.     Heart sounds: Normal heart sounds.  Pulmonary:     Effort: Pulmonary effort is normal.     Breath sounds: Normal breath sounds.  Abdominal:     Palpations: Abdomen is soft.     Tenderness: There is no abdominal tenderness.  Musculoskeletal:     Comments: Right shoulder in sling, right ankle in splint  Neurological:     Mental Status: Mental status is at baseline.     Labs on Admission: I have personally reviewed following labs and imaging studies  CBC: Recent Labs  Lab 01/14/23 1840  WBC 13.8*  NEUTROABS 10.3*  HGB 10.8*  HCT 32.8*  MCV 98.8  PLT 483*   Basic Metabolic Panel: Recent Labs  Lab 01/14/23 1840  NA 138  K 3.3*  CL 106  CO2 19*  GLUCOSE 104*  BUN 10  CREATININE 0.74  CALCIUM 8.2*   GFR: Estimated Creatinine Clearance: 56.2 mL/min (by C-G formula based on SCr of 0.74 mg/dL). Liver Function Tests: Recent Labs  Lab 01/14/23 1840  AST 30  ALT 21  ALKPHOS 56  BILITOT 0.3  PROT 6.7  ALBUMIN 4.1   No results for input(s): "LIPASE", "AMYLASE" in the last 168 hours. No results for input(s): "AMMONIA" in the last 168 hours. Coagulation Profile: Recent Labs  Lab 01/14/23 1856  INR 1.2   Cardiac Enzymes: No results for input(s): "CKTOTAL", "CKMB", "CKMBINDEX", "TROPONINI" in the last 168 hours. BNP (last 3 results) No results for input(s): "PROBNP" in the last 8760  hours. HbA1C: No results for input(s): "HGBA1C" in the last 72 hours. CBG: Recent Labs  Lab 01/14/23 1852  GLUCAP 97   Lipid Profile: No results for input(s): "CHOL", "HDL", "LDLCALC", "TRIG", "CHOLHDL", "LDLDIRECT" in the last 72 hours. Thyroid Function Tests: No results for input(s): "TSH", "T4TOTAL", "FREET4", "T3FREE", "THYROIDAB" in the last 72 hours. Anemia Panel: No results for input(s): "VITAMINB12", "FOLATE", "FERRITIN", "TIBC", "IRON", "RETICCTPCT" in the last 72 hours. Urine analysis:    Component Value Date/Time   COLORURINE STRAW (A) 08/08/2021 0610   APPEARANCEUR CLEAR (A) 08/08/2021 0610   APPEARANCEUR Clear 01/06/2014 0909   LABSPEC 1.003 (L) 08/08/2021 0610   LABSPEC 1.017 01/06/2014 0909   PHURINE 5.0 08/08/2021 0610   GLUCOSEU NEGATIVE 08/08/2021 0610   GLUCOSEU Negative 01/06/2014 0909   HGBUR NEGATIVE 08/08/2021 0610   BILIRUBINUR NEGATIVE 08/08/2021 0610  BILIRUBINUR Negative 01/06/2014 0909   KETONESUR NEGATIVE 08/08/2021 0610   PROTEINUR NEGATIVE 08/08/2021 0610   NITRITE NEGATIVE 08/08/2021 0610   LEUKOCYTESUR NEGATIVE 08/08/2021 0610   LEUKOCYTESUR 2+ 01/06/2014 0909    Radiological Exams on Admission: CT ANKLE RIGHT WO CONTRAST  Result Date: 01/14/2023 CLINICAL DATA:  Trauma EXAM: CT OF THE RIGHT ANKLE WITHOUT CONTRAST TECHNIQUE: Multidetector CT imaging of the right ankle was performed according to the standard protocol. Multiplanar CT image reconstructions were also generated. RADIATION DOSE REDUCTION: This exam was performed according to the departmental dose-optimization program which includes automated exposure control, adjustment of the mA and/or kV according to patient size and/or use of iterative reconstruction technique. COMPARISON:  Right ankle x-ray 01/14/2023 FINDINGS: Bones/Joint/Cartilage The bones are osteopenic. There is an acute comminuted medial malleolus fracture. The tip of the medial malleolus is displaced inferiorly 6 mm. There  is also an acute distal fibular fracture which is markedly comminuted at the level of the ankle mortise. The tip of the lateral malleolus as well aligned, but the proximal fracture fragment is displaced anteriorly 1 cm in there is 1 cm of overlap. There is anterior dislocation of the anterior talus in relation to the talar dome. There is a mildly comminuted posterior malleolar fracture. The posterior malleolus as well aligned. The posterior aspect of the distal tibia approximate the anterior talus. The talar dome appears intact. Ligaments Suboptimally assessed by CT. Muscles and Tendons There is intramuscular edema and edema surrounding tendons in the fracture sites. No foreign body. Soft tissues There is subcutaneous edema and swelling surrounding the ankle and lateral aspect of the foot. IMPRESSION: 1. Acute comminuted medial malleolus fracture. 2. Acute displaced comminuted distal fibular fracture. 3. Acute mildly comminuted posterior malleolar fracture. 4. Anterior dislocation of the anterior talus in relation to the talar dome. 5. The posterior aspect of the distal tibia approximates the anterior talus. The talar dome appears intact. Electronically Signed   By: Darliss Cheney M.D.   On: 01/14/2023 21:52   DG Ankle 2 Views Right  Result Date: 01/14/2023 CLINICAL DATA:  Postreduction EXAM: RIGHT ANKLE - 2 VIEW COMPARISON:  Radiographs dated January 14, 2023 FINDINGS: Status post reduction and casting redemonstrate trimalleolar fractures. Interval partial improvement of the posterior talar dislocation. IMPRESSION: Status post reduction and casting redemonstrate trimalleolar fractures. Interval partial improvement of the posterior talar dislocation. Electronically Signed   By: Larose Hires D.O.   On: 01/14/2023 21:15   DG Chest Portable 1 View  Result Date: 01/14/2023 CLINICAL DATA:  Fall EXAM: PORTABLE CHEST 1 VIEW COMPARISON:  08/08/2021 FINDINGS: The heart size and mediastinal contours are within normal  limits. Aortic atherosclerosis. Both lungs are clear. Acute proximal right humerus fracture. IMPRESSION: No active disease. Acute proximal right humerus fracture. Electronically Signed   By: Jasmine Pang M.D.   On: 01/14/2023 20:09   DG Ankle Right Port  Result Date: 01/14/2023 CLINICAL DATA:  Fall with ankle pain EXAM: PORTABLE RIGHT ANKLE - 2 VIEW COMPARISON:  None Available. FINDINGS: Acute mildly comminuted trimalleolar fracture with posterior dislocation of the talus with respect to the distal tibia. Distal fibular and posterior malleolar fracture fragments are displaced. Copious soft tissue swelling. IMPRESSION: Acute trimalleolar fracture with posterior dislocation of the talus. Electronically Signed   By: Jasmine Pang M.D.   On: 01/14/2023 20:08   DG Pelvis Portable  Result Date: 01/14/2023 CLINICAL DATA:  Fall EXAM: PORTABLE PELVIS 1-2 VIEWS COMPARISON:  None Available. FINDINGS: SI joints are non widened.  Pubic symphysis and rami appear intact. Mild chronic sclerosis at the humeral heads. No fracture or dislocation IMPRESSION: No acute osseous abnormality Electronically Signed   By: Jasmine Pang M.D.   On: 01/14/2023 20:03   DG Tibia/Fibula Right  Result Date: 01/14/2023 CLINICAL DATA:  Fall EXAM: RIGHT TIBIA AND FIBULA - 2 VIEW COMPARISON:  None Available. FINDINGS: Proximal and mid tibia and fibula appear intact. Acute fracture dislocation at the ankle. Diffuse soft tissue swelling IMPRESSION: Acute fracture dislocation at the ankle. See separately dictated ankle report Electronically Signed   By: Jasmine Pang M.D.   On: 01/14/2023 20:02   CT HEAD WO CONTRAST ( )  Result Date: 01/14/2023 CLINICAL DATA:  Head trauma, moderate-severe; Neck trauma (Age >= 65y) EXAM: CT HEAD WITHOUT CONTRAST CT MAXILLOFACIAL WITHOUT CONTRAST CT CERVICAL SPINE WITHOUT CONTRAST TECHNIQUE: Multidetector CT imaging of the head, cervical spine, and maxillofacial structures were performed using the standard  protocol without intravenous contrast. Multiplanar CT image reconstructions of the cervical spine and maxillofacial structures were also generated. RADIATION DOSE REDUCTION: This exam was performed according to the departmental dose-optimization program which includes automated exposure control, adjustment of the mA and/or kV according to patient size and/or use of iterative reconstruction technique. COMPARISON:  CT Head 08/08/21 FINDINGS: CT HEAD FINDINGS Brain: No hemorrhage. No hydrocephalus. No extra-axial fluid collection. Unchanged mineralization of the right thalamus. No CT evidence of an acute cortical infarct. There is sequela of mild chronic microvascular ischemic change. Vascular: No hyperdense vessel or unexpected calcification. Skull: Normal. Negative for fracture or focal lesion. Other: None. CT MAXILLOFACIAL FINDINGS Osseous: No fracture or mandibular dislocation. 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 Orbits: Negative. No traumatic or inflammatory finding. Sinuses: No middle ear or mastoid effusion. Paranasal sinuses are notable for right maxillary antrostomy and osseous findings suggestive of chronic right maxillary sinusitis. Orbits are unremarkable. Soft tissues: Negative. CT CERVICAL SPINE FINDINGS Alignment: Trace anterolisthesis of C2 on C3. Skull base and vertebrae: No acute fracture. No primary bone lesion or focal pathologic process. Soft tissues and spinal canal: No prevertebral fluid or swelling. No visible canal hematoma. Disc levels:  No evidence of high-grade spinal canal stenosis. Upper chest: Negative. Other: None IMPRESSION: 1. No acute intracranial abnormality. 2. No acute facial bone fracture. 3. No acute fracture or traumatic listhesis of the cervical spine. 4. 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 Electronically Signed   By: Lorenza Cambridge M.D.   On: 01/14/2023 20:02   CT Cervical Spine Wo Contrast  Result Date: 01/14/2023 CLINICAL DATA:  Head  trauma, moderate-severe; Neck trauma (Age >= 65y) EXAM: CT HEAD WITHOUT CONTRAST CT MAXILLOFACIAL WITHOUT CONTRAST CT CERVICAL SPINE WITHOUT CONTRAST TECHNIQUE: Multidetector CT imaging of the head, cervical spine, and maxillofacial structures were performed using the standard protocol without intravenous contrast. Multiplanar CT image reconstructions of the cervical spine and maxillofacial structures were also generated. RADIATION DOSE REDUCTION: This exam was performed according to the departmental dose-optimization program which includes automated exposure control, adjustment of the mA and/or kV according to patient size and/or use of iterative reconstruction technique. COMPARISON:  CT Head 08/08/21 FINDINGS: CT HEAD FINDINGS Brain: No hemorrhage. No hydrocephalus. No extra-axial fluid collection. Unchanged mineralization of the right thalamus. No CT evidence of an acute cortical infarct. There is sequela of mild chronic microvascular ischemic change. Vascular: No hyperdense vessel or unexpected calcification. Skull: Normal. Negative for fracture or focal lesion. Other: None. CT MAXILLOFACIAL FINDINGS Osseous: No fracture or mandibular dislocation. Severe odontogenic disease with  multiple dental caries. Orbits: Negative. No traumatic or inflammatory finding. Sinuses: No middle ear or mastoid effusion. Paranasal sinuses are notable for right maxillary antrostomy and osseous findings suggestive of chronic right maxillary sinusitis. Orbits are unremarkable. Soft tissues: Negative. CT CERVICAL SPINE FINDINGS Alignment: Trace anterolisthesis of C2 on C3. Skull base and vertebrae: No acute fracture. No primary bone lesion or focal pathologic process. Soft tissues and spinal canal: No prevertebral fluid or swelling. No visible canal hematoma. Disc levels:  No evidence of high-grade spinal canal stenosis. Upper chest: Negative. Other: None IMPRESSION: 1. No acute intracranial abnormality. 2. No acute facial bone fracture.  3. No acute fracture or traumatic listhesis of the cervical spine. 4. 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 Electronically Signed   By: Lorenza Cambridge M.D.   On: 01/14/2023 20:02   CT MAXILLOFACIAL WO CONTRAST  Result Date: 01/14/2023 CLINICAL DATA:  Head trauma, moderate-severe; Neck trauma (Age >= 65y) EXAM: CT HEAD WITHOUT CONTRAST CT MAXILLOFACIAL WITHOUT CONTRAST CT CERVICAL SPINE WITHOUT CONTRAST TECHNIQUE: Multidetector CT imaging of the head, cervical spine, and maxillofacial structures were performed using the standard protocol without intravenous contrast. Multiplanar CT image reconstructions of the cervical spine and maxillofacial structures were also generated. RADIATION DOSE REDUCTION: This exam was performed according to the departmental dose-optimization program which includes automated exposure control, adjustment of the mA and/or kV according to patient size and/or use of iterative reconstruction technique. COMPARISON:  CT Head 08/08/21 FINDINGS: CT HEAD FINDINGS Brain: No hemorrhage. No hydrocephalus. No extra-axial fluid collection. Unchanged mineralization of the right thalamus. No CT evidence of an acute cortical infarct. There is sequela of mild chronic microvascular ischemic change. Vascular: No hyperdense vessel or unexpected calcification. Skull: Normal. Negative for fracture or focal lesion. Other: None. CT MAXILLOFACIAL FINDINGS Osseous: No fracture or mandibular dislocation. 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 Orbits: Negative. No traumatic or inflammatory finding. Sinuses: No middle ear or mastoid effusion. Paranasal sinuses are notable for right maxillary antrostomy and osseous findings suggestive of chronic right maxillary sinusitis. Orbits are unremarkable. Soft tissues: Negative. CT CERVICAL SPINE FINDINGS Alignment: Trace anterolisthesis of C2 on C3. Skull base and vertebrae: No acute fracture. No primary bone lesion or focal pathologic  process. Soft tissues and spinal canal: No prevertebral fluid or swelling. No visible canal hematoma. Disc levels:  No evidence of high-grade spinal canal stenosis. Upper chest: Negative. Other: None IMPRESSION: 1. No acute intracranial abnormality. 2. No acute facial bone fracture. 3. No acute fracture or traumatic listhesis of the cervical spine. 4. 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 Electronically Signed   By: Lorenza Cambridge M.D.   On: 01/14/2023 20:02   DG Humerus Right  Result Date: 01/14/2023 CLINICAL DATA:  Fall EXAM: RIGHT HUMERUS - 2+ VIEW COMPARISON:  None Available. FINDINGS: AC joint is intact. Acute fracture involving the humeral neck with displaced greater tuberosity fracture fragment. No humeral head dislocation IMPRESSION: Acute humeral neck fracture with displaced greater tuberosity fracture fragment. Electronically Signed   By: Jasmine Pang M.D.   On: 01/14/2023 20:01     Data Reviewed: Relevant notes from primary care and specialist visits, past discharge summaries as available in EHR, including Care Everywhere. Prior diagnostic testing as pertinent to current admission diagnoses Updated medications and problem lists for reconciliation ED course, including vitals, labs, imaging, treatment and response to treatment Triage notes, nursing and pharmacy notes and ED provider's notes Notable results as noted in HPI   Assessment and Plan: * Closed  right trimalleolar fracture, initial encounter S/p fracture reduction in the ED 4 OR in the a.m. Pain control N.p.o. from midnight Podiatrist, Dr. Excell Seltzer aware -- asked that pt keeps leg elevated overnight with ice behind the knee to help with swelling   Closed fracture of right proximal humerus Management as above Orthopedist, Dr. Signa Kell consulted Maintain sling as recommended from the ED  Alcohol use disorder Alcohol intoxication EtOH level was 214 CIWA withdrawal protocol  Hypokalemia Oral KCl 40  mEq p.o. x 1  Anemia, mild Hemoglobin 10.8 near baseline of 11.1  Benign essential HTN Normotensive to soft Hold antihypertensives     DVT prophylaxis: SCD  Consults: Podiatry, Dr. Excell Seltzer, orthopedics, Dr. Signa Kell  Advance Care Planning: full code  Family Communication: none  Disposition Plan: Back to previous home environment  Severity of Illness: The appropriate patient status for this patient is INPATIENT. Inpatient status is judged to be reasonable and necessary in order to provide the required intensity of service to ensure the patient's safety. The patient's presenting symptoms, physical exam findings, and initial radiographic and laboratory data in the context of their chronic comorbidities is felt to place them at high risk for further clinical deterioration. Furthermore, it is not anticipated that the patient will be medically stable for discharge from the hospital within 2 midnights of admission.   * I certify that at the point of admission it is my clinical judgment that the patient will require inpatient hospital care spanning beyond 2 midnights from the point of admission due to high intensity of service, high risk for further deterioration and high frequency of surveillance required.*  Author: Andris Baumann, MD 01/14/2023 10:30 PM  For on call review [email or web address].

## 2023-01-15 DIAGNOSIS — S82851A Displaced trimalleolar fracture of right lower leg, initial encounter for closed fracture: Secondary | ICD-10-CM

## 2023-01-15 LAB — CBC WITH DIFFERENTIAL/PLATELET
Abs Immature Granulocytes: 0.02 10*3/uL (ref 0.00–0.07)
Basophils Absolute: 0 10*3/uL (ref 0.0–0.1)
Basophils Relative: 0 %
Eosinophils Absolute: 0.1 10*3/uL (ref 0.0–0.5)
Eosinophils Relative: 1 %
HCT: 29.3 % — ABNORMAL LOW (ref 36.0–46.0)
Hemoglobin: 9.4 g/dL — ABNORMAL LOW (ref 12.0–15.0)
Immature Granulocytes: 0 %
Lymphocytes Relative: 17 %
Lymphs Abs: 1.4 10*3/uL (ref 0.7–4.0)
MCH: 32.4 pg (ref 26.0–34.0)
MCHC: 32.1 g/dL (ref 30.0–36.0)
MCV: 101 fL — ABNORMAL HIGH (ref 80.0–100.0)
Monocytes Absolute: 0.8 10*3/uL (ref 0.1–1.0)
Monocytes Relative: 9 %
Neutro Abs: 6.1 10*3/uL (ref 1.7–7.7)
Neutrophils Relative %: 73 %
Platelets: 375 10*3/uL (ref 150–400)
RBC: 2.9 MIL/uL — ABNORMAL LOW (ref 3.87–5.11)
RDW: 14.1 % (ref 11.5–15.5)
WBC: 8.4 10*3/uL (ref 4.0–10.5)
nRBC: 0.2 % (ref 0.0–0.2)

## 2023-01-15 LAB — SURGICAL PCR SCREEN
MRSA, PCR: NEGATIVE
Staphylococcus aureus: NEGATIVE

## 2023-01-15 LAB — BASIC METABOLIC PANEL
Anion gap: 6 (ref 5–15)
BUN: 13 mg/dL (ref 8–23)
CO2: 23 mmol/L (ref 22–32)
Calcium: 7.9 mg/dL — ABNORMAL LOW (ref 8.9–10.3)
Chloride: 106 mmol/L (ref 98–111)
Creatinine, Ser: 0.73 mg/dL (ref 0.44–1.00)
GFR, Estimated: >60 mL/min (ref >60)
Glucose, Bld: 146 mg/dL — ABNORMAL HIGH (ref 70–99)
Potassium: 3.2 mmol/L — ABNORMAL LOW (ref 3.5–5.1)
Sodium: 135 mmol/L (ref 135–145)

## 2023-01-15 LAB — VITAMIN D 25 HYDROXY (VIT D DEFICIENCY, FRACTURES): Vit D, 25-Hydroxy: 36.07 ng/mL (ref 30–100)

## 2023-01-15 LAB — HIV ANTIBODY (ROUTINE TESTING W REFLEX): HIV Screen 4th Generation wRfx: NONREACTIVE

## 2023-01-15 MED ORDER — MUPIROCIN 2 % EX OINT
1.0000 | TOPICAL_OINTMENT | Freq: Two times a day (BID) | CUTANEOUS | Status: DC
  Administered 2023-01-16 – 2023-01-19 (×7): 1 via NASAL
  Filled 2023-01-15: qty 22

## 2023-01-15 MED ORDER — HYDROMORPHONE HCL 1 MG/ML IJ SOLN
1.0000 mg | INTRAMUSCULAR | Status: DC | PRN
  Administered 2023-01-15 – 2023-01-16 (×8): 1 mg via INTRAVENOUS
  Filled 2023-01-15 (×9): qty 1

## 2023-01-15 MED ORDER — HYDROMORPHONE HCL 1 MG/ML IJ SOLN: 1.0000 mg | INTRAMUSCULAR | Status: DC | PRN

## 2023-01-15 MED ORDER — BOOST / RESOURCE BREEZE PO LIQD CUSTOM
1.0000 | Freq: Three times a day (TID) | ORAL | Status: DC
  Administered 2023-01-15 – 2023-01-23 (×19): 1 via ORAL

## 2023-01-15 MED ORDER — NICOTINE 7 MG/24HR TD PT24
7.0000 mg | MEDICATED_PATCH | Freq: Every day | TRANSDERMAL | Status: DC
  Administered 2023-01-15 – 2023-01-23 (×8): 7 mg via TRANSDERMAL
  Filled 2023-01-15 (×9): qty 1

## 2023-01-15 MED ORDER — POTASSIUM CHLORIDE CRYS ER 20 MEQ PO TBCR
40.0000 meq | EXTENDED_RELEASE_TABLET | ORAL | Status: AC
  Administered 2023-01-15 (×2): 40 meq via ORAL
  Filled 2023-01-15 (×2): qty 2

## 2023-01-15 NOTE — TOC Progression Note (Signed)
Transition of Care [facility]) - Progression Note    Patient Details  Name: Becky Ward MRN: [id number] Date of Birth: 02/05/1956  [facility] of [facility]) CM/SW Contact  Marlowe Sax, RN Phone Number: 01/15/2023, 2:35 PM  Clinical Narrative:     Patient lives alone,  Podiatry consulted, no surgery for shoulder injury, ETOH resources added to the AVS  Expected Discharge Plan: Home w Home Health Services Barriers to Discharge: Continued Medical Work up  Expected Discharge Plan and Services       Living arrangements for the past 2 months: Skilled Nursing Facility                                       Social Determinants of Health (SDOH) Interventions SDOH Screenings   Food Insecurity: Food Insecurity Present (01/15/2023)  Housing: High Risk (01/15/2023)  Transportation Needs: Unmet Transportation Needs (01/15/2023)  Utilities: At Risk (01/15/2023)  Financial Resource Strain: High Risk (06/08/2022)   Received from [facility], [facility]  Physical Activity: Inactive (06/08/2022)   Received from [facility], [facility]  Social Connections: Unknown (06/08/2022)   Received from [facility]  Recent Concern: Social Connections - Socially Isolated (06/08/2022)   Received from [facility]  Stress: Stress Concern Present (06/08/2022)   Received from [facility], [facility]  Tobacco Use: High Risk (01/14/2023)    Readmission Risk Interventions     No data to display

## 2023-01-15 NOTE — Progress Notes (Signed)
Progress Note   Patient: Becky Ward NGE:[id number] DOB: 1956/03/01 DOA: 01/14/2023     1 DOS: the patient was seen and examined on 01/15/2023     Subjective:  Patient seen and examined at bedside this morning She complains of right lower extremity and right shoulder pain of intensity 10/10 Pain is relieved some with pain medication. Denies nausea vomiting chest pain or cough I discussed the case with the podiatrist Dr. Farley Ly who is planning surgery likely on Thursday or Friday Orthopedics also on board and case discussed  Brief hospital course:  From HPI "Becky Ward is a 67 y.o. female with medical history significant for 66 year old female with a history of hypertension, alcohol use disorder and nicotine dependence who presents to the ED via EMS following a fall onto her right side while walking her dogs, after her dogs ran into another dog.  She landed on her right shoulder with immediate pain of the right shoulder and twisted her ankle and has been unable to walk since the incident.  She hit her head but denies loss of consciousness. ED course and data review: Hypotensive on arrival at 68/50, fluid responsive to 115/77.  Intermittent bradycardia to the high 40s and low 50s.  Imaging results showed several right ankle fractures as well as right shoulder fracture and therefore podiatry, orthopedics as well as hospitalist service consulted to admit patient for further management"  Assessment and Plan: Closed right trimalleolar fracture, initial encounter S/p fracture reduction in the ED Continue current pain management Podiatrist, Dr. Excell Seltzer aware -- asked that pt keeps leg elevated overnight with ice behind the knee to help with swelling I discussed the case with the podiatrist Dr. Farley Ly who is planning surgery likely on Thursday or Friday Orthopedics also on board and case discussed Right ankle imaging showing acute comminuted medial malleolus fracture and acute displaced comminuted  distal fibula fracture, acute mildly comminuted posterior malleolar fracture, and anterior dislocation of the anterior Tylox in relation to the talar bone I reviewed the CT scan of the brain that did not show any acute intracranial pathology aside severe dental caries  Closed fracture of right proximal humerus Management as above Orthopedist, Dr. Signa Kell consulted I discussed the case with Dr. Allena Katz today Maintain sling as recommended from the ED At this point orthopedics is not planning any surgical intervention for right shoulder fracture To continue nonweightbearing   Alcohol use disorder Alcohol intoxication EtOH level was 214 CIWA withdrawal protocol   Hypokalemia Potassium level today noted to be 3.2 We will continue repletion and monitoring Oral KCl 40 mEq p.o. x 1   Anemia, mild Hemoglobin 10.8 near baseline of 11.1   Benign essential HTN Normotensive to soft Hold antihypertensives     DVT prophylaxis: SCD   Consults: Podiatry, Dr. Faylene Million, orthopedics, Dr. Signa Kell   Advance Care Planning: full code   Family Communication: none     Physical Exam: Vitals and nursing note reviewed.  Constitutional:      General: She is not in acute distress. HENT:     Head: Normocephalic and atraumatic.  Cardiovascular:     Rate and Rhythm: Normal rate and regular rhythm.     Heart sounds: Normal heart sounds.  Pulmonary:     Effort: Pulmonary effort is normal.     Breath sounds: Normal breath sounds.  Abdominal:     Palpations: Abdomen is soft.     Tenderness: There is no abdominal tenderness.  Musculoskeletal:     Comments: Right  shoulder in a sling and right ankle splinted Neurological:     Mental Status: Mental status is at baseline.     Data Reviewed: I have reviewed patient's CT scan of the brain, x-ray as well as CT scan involving the right ankle with findings as shown above, I have discussed and reviewed the documentation of podiatry, as well as  orthopedic surgeon, admission provide this note, vitals labs as well as nursing documentation     Vitals:   01/14/23 2300 01/14/23 2335 01/14/23 2338 01/15/23 0742  BP: 132/76  119/66 126/75  Pulse: 78  66 69  Resp: 16  16 14   Temp:   98.2 F (36.8 C) 98.6 F (37 C)  TempSrc:    Oral  SpO2: 97%  96% 97%  Weight:  55.1 kg    Height:         Author: Loyce Dys, MD 01/15/2023 3:33 PM  For on call review [email or web address].

## 2023-01-15 NOTE — Consult Note (Addendum)
ORTHOPAEDIC CONSULTATION  REQUESTING PHYSICIAN: Loyce Dys, MD  Chief Complaint:   Right shoulder pain  History of Present Illness: Becky Ward is a 67 y.o. right-handed female  Who presented after a fall last night.  She landed on her right shoulder and had a twisting injury to her ankle.  She does live alone.  She ambulates unassisted at baseline.  Radiographs in the emergency department showed a right proximal humerus fracture and a right trimalleolar ankle fracture/dislocation.  Of note, patient has a medical history significant for Hypertension, hyperlipidemia, history of alcohol abuse, and rheumatoid arthritis on methotrexate.  She does smoke daily.  She has approximately 2 drinks daily.    Past Medical History:  Diagnosis Date   Colitis    Hypertension    Past Surgical History:  Procedure Laterality Date   NASAL SINUS SURGERY     Social History   Socioeconomic History   Marital status: Single    Spouse name: Not on file   Number of children: Not on file   Years of education: Not on file   Highest education level: Not on file  Occupational History   Not on file  Tobacco Use   Smoking status: Every Day   Smokeless tobacco: Never  Substance and Sexual Activity   Alcohol use: Yes    Comment: daily beer   Drug use: Not Currently   Sexual activity: Not Currently  Other Topics Concern   Not on file  Social History Narrative   Alcohol- 3-4 beers-day/smoking: 1ppd; [address]; 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; 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    Social Determinants of Health   Financial Resource Strain: High Risk (06/08/2022)   Received from [facility], [facility]   Overall Financial Resource Strain (CARDIA)    Difficulty of Paying Living Expenses: Very hard  Food Insecurity: Food Insecurity Present (01/15/2023)   Hunger Vital  Sign    Worried About Running Out of Food in the Last Year: Often true    Ran Out of Food in the Last Year: Often true  Transportation Needs: Unmet Transportation Needs (01/15/2023)   PRAPARE - Administrator, Civil Service (Medical): Yes    Lack of Transportation (Non-Medical): No  Physical Activity: Inactive (06/08/2022)   [facility], [facility]   Exercise Vital Sign    Days of Exercise per Week: 0 days    Minutes of Exercise per Session: 0 min  Stress: Stress Concern Present (06/08/2022)   Received from [facility], [facility]   [employer] of Occupational Health - Occupational Stress Questionnaire    Feeling of Stress : Rather much  Social Connections: Unknown (06/08/2022)   Received from [facility]   Social Connection and Isolation Panel [NHANES]    Frequency of Communication with Friends and Family: More than three times a week    Frequency of Social Gatherings with Friends and Family: More than three times a week    Attends Religious Services: Never    Database administrator or Organizations: No    Attends Banker Meetings: Never    Marital Status: Not on file  Recent Concern: Social Connections - Socially Isolated (06/08/2022)   Received from [facility]   Social Connection and Isolation Panel [NHANES]    Frequency of Communication with Friends and Family: More than three times a  week    Frequency of Social Gatherings with Friends and Family: More than three times a week    Attends Religious Services: Never    Database administrator or Organizations: No    Attends Engineer, structural: Never    Marital Status: Divorced   Family History  Problem Relation Age of Onset   Leukemia Father    Allergies  Allergen Reactions   Ciprofloxacin Rash   Banana Hives   Morphine Itching    Causes itching and blisters per  [facility] Clinical Summary.   Codeine Rash    Night Terrors   Prior to Admission medications   Medication Sig Start Date End Date Taking? Authorizing Provider  b complex vitamins capsule Take 1 capsule by mouth daily.   Yes [provider]  Cholecalciferol (VITAMIN D3) 10 MCG (400 UNIT) tablet Take by mouth.   Yes [provider]  folic acid (FOLVITE) 1 MG tablet Take 1 mg by mouth daily. 06/11/22 06/11/23 Yes [provider]  lisinopril-hydrochlorothiazide (PRINZIDE,ZESTORETIC) 20-12.5 MG per tablet Take 0.5 tablets by mouth daily. 01/28/15  Yes [provider]  pravastatin (PRAVACHOL) 20 MG tablet Take 1 tablet by mouth at bedtime. 11/10/19  Yes [provider]  albuterol (VENTOLIN HFA) 108 (90 Base) MCG/ACT inhaler SMARTSIG:2 Puff(s) By Mouth Every 4 Hours PRN Patient not taking: Reported on 11/17/2021 07/01/21   [provider]  methotrexate (RHEUMATREX) 2.5 MG tablet Take 2.5 mg by mouth once a week. Take 8 tablets weekly 06/11/22   [provider]  Multiple Vitamins-Minerals (MULTI ADULT GUMMIES) CHEW Chew by mouth.    [provider]  nicotine (QC NICOTINE TRANSDERMAL SYSTEM) 14 mg/24hr patch Place 14 mg onto the skin daily.    [provider]  ondansetron (ZOFRAN ODT) 4 MG disintegrating tablet Take 1 tablet (4 mg total) by mouth every 8 (eight) hours as needed. Patient not taking: Reported on 11/17/2021 02/04/20   Menshew, Charlesetta Ivory, PA-C  potassium chloride SA (KLOR-CON M) 20 MEQ tablet Take 20 mEq by mouth daily.    [provider]   Recent Labs    01/14/23 1840 01/14/23 1856  WBC 13.8*  --   HGB 10.8*  --   HCT 32.8*  --   PLT 483*  --   K 3.3*  --   CL 106  --   CO2 19*  --   BUN 10  --   CREATININE 0.74  --   GLUCOSE 104*  --   CALCIUM 8.2*  --   INR  --  1.2   CT ANKLE RIGHT WO CONTRAST  Result Date: 01/14/2023 CLINICAL DATA:  Trauma EXAM: CT OF THE RIGHT ANKLE WITHOUT CONTRAST  TECHNIQUE: Multidetector CT imaging of the right ankle was performed according to the standard protocol. Multiplanar CT image reconstructions were also generated. RADIATION DOSE REDUCTION: This exam was performed according to the departmental dose-optimization program which includes automated exposure control, adjustment of the mA and/or kV according to patient size and/or use of iterative reconstruction technique. COMPARISON:  Right ankle x-ray 01/14/2023 FINDINGS: Bones/Joint/Cartilage The bones are osteopenic. There is an acute comminuted medial malleolus fracture. The tip of the medial malleolus is displaced inferiorly 6 mm. There is also an acute distal fibular fracture which is markedly comminuted at the level of the ankle mortise. The tip of the lateral malleolus as well aligned, but the proximal fracture fragment is displaced anteriorly 1 cm in there is 1 cm of overlap.  There is anterior dislocation of the anterior talus in relation to the talar dome. There is a mildly comminuted posterior malleolar fracture. The posterior malleolus as well aligned. The posterior aspect of the distal tibia approximate the anterior talus. The talar dome appears intact. Ligaments Suboptimally assessed by CT. Muscles and Tendons There is intramuscular edema and edema surrounding tendons in the fracture sites. No foreign body. Soft tissues There is subcutaneous edema and swelling surrounding the ankle and lateral aspect of the foot. IMPRESSION: 1. Acute comminuted medial malleolus fracture. 2. Acute displaced comminuted distal fibular fracture. 3. Acute mildly comminuted posterior malleolar fracture. 4. Anterior dislocation of the anterior talus in relation to the talar dome. 5. The posterior aspect of the distal tibia approximates the anterior talus. The talar dome appears intact. Electronically Signed   By: Darliss Cheney M.D.   On: 01/14/2023 21:52   DG Ankle 2 Views Right  Result Date: 01/14/2023 CLINICAL DATA:   Postreduction EXAM: RIGHT ANKLE - 2 VIEW COMPARISON:  Radiographs dated January 14, 2023 FINDINGS: Status post reduction and casting redemonstrate trimalleolar fractures. Interval partial improvement of the posterior talar dislocation. IMPRESSION: Status post reduction and casting redemonstrate trimalleolar fractures. Interval partial improvement of the posterior talar dislocation. Electronically Signed   By: Larose Hires D.O.   On: 01/14/2023 21:15   DG Chest Portable 1 View  Result Date: 01/14/2023 CLINICAL DATA:  Fall EXAM: PORTABLE CHEST 1 VIEW COMPARISON:  08/08/2021 FINDINGS: The heart size and mediastinal contours are within normal limits. Aortic atherosclerosis. Both lungs are clear. Acute proximal right humerus fracture. IMPRESSION: No active disease. Acute proximal right humerus fracture. Electronically Signed   By: Jasmine Pang M.D.   On: 01/14/2023 20:09   DG Ankle Right Port  Result Date: 01/14/2023 CLINICAL DATA:  Fall with ankle pain EXAM: PORTABLE RIGHT ANKLE - 2 VIEW COMPARISON:  None Available. FINDINGS: Acute mildly comminuted trimalleolar fracture with posterior dislocation of the talus with respect to the distal tibia. Distal fibular and posterior malleolar fracture fragments are displaced. Copious soft tissue swelling. IMPRESSION: Acute trimalleolar fracture with posterior dislocation of the talus. Electronically Signed   By: Jasmine Pang M.D.   On: 01/14/2023 20:08   DG Pelvis Portable  Result Date: 01/14/2023 CLINICAL DATA:  Fall EXAM: PORTABLE PELVIS 1-2 VIEWS COMPARISON:  None Available. FINDINGS: SI joints are non widened. Pubic symphysis and rami appear intact. Mild chronic sclerosis at the humeral heads. No fracture or dislocation IMPRESSION: No acute osseous abnormality Electronically Signed   By: Jasmine Pang M.D.   On: 01/14/2023 20:03   DG Tibia/Fibula Right  Result Date: 01/14/2023 CLINICAL DATA:  Fall EXAM: RIGHT TIBIA AND FIBULA - 2 VIEW COMPARISON:  None  Available. FINDINGS: Proximal and mid tibia and fibula appear intact. Acute fracture dislocation at the ankle. Diffuse soft tissue swelling IMPRESSION: Acute fracture dislocation at the ankle. See separately dictated ankle report Electronically Signed   By: Jasmine Pang M.D.   On: 01/14/2023 20:02   CT HEAD WO CONTRAST ( )  Result Date: 01/14/2023 CLINICAL DATA:  Head trauma, moderate-severe; Neck trauma (Age >= 65y) EXAM: CT HEAD WITHOUT CONTRAST CT MAXILLOFACIAL WITHOUT CONTRAST CT CERVICAL SPINE WITHOUT CONTRAST TECHNIQUE: Multidetector CT imaging of the head, cervical spine, and maxillofacial structures were performed using the standard protocol without intravenous contrast. Multiplanar CT image reconstructions of the cervical spine and maxillofacial structures were also generated. RADIATION DOSE REDUCTION: This exam was performed according to the departmental dose-optimization program which includes automated exposure  control, adjustment of the mA and/or kV according to patient size and/or use of iterative reconstruction technique. COMPARISON:  CT Head 08/08/21 FINDINGS: CT HEAD FINDINGS Brain: No hemorrhage. No hydrocephalus. No extra-axial fluid collection. Unchanged mineralization of the right thalamus. No CT evidence of an acute cortical infarct. There is sequela of mild chronic microvascular ischemic change. Vascular: No hyperdense vessel or unexpected calcification. Skull: Normal. Negative for fracture or focal lesion. Other: None. CT MAXILLOFACIAL FINDINGS Osseous: No fracture or mandibular dislocation. 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 Orbits: Negative. No traumatic or inflammatory finding. Sinuses: No middle ear or mastoid effusion. Paranasal sinuses are notable for right maxillary antrostomy and osseous findings suggestive of chronic right maxillary sinusitis. Orbits are unremarkable. Soft tissues: Negative. CT CERVICAL SPINE FINDINGS Alignment: Trace anterolisthesis of C2  on C3. Skull base and vertebrae: No acute fracture. No primary bone lesion or focal pathologic process. Soft tissues and spinal canal: No prevertebral fluid or swelling. No visible canal hematoma. Disc levels:  No evidence of high-grade spinal canal stenosis. Upper chest: Negative. Other: None IMPRESSION: 1. No acute intracranial abnormality. 2. No acute facial bone fracture. 3. No acute fracture or traumatic listhesis of the cervical spine. 4. 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 Electronically Signed   By: Lorenza Cambridge M.D.   On: 01/14/2023 20:02   CT Cervical Spine Wo Contrast  Result Date: 01/14/2023 CLINICAL DATA:  Head trauma, moderate-severe; Neck trauma (Age >= 65y) EXAM: CT HEAD WITHOUT CONTRAST CT MAXILLOFACIAL WITHOUT CONTRAST CT CERVICAL SPINE WITHOUT CONTRAST TECHNIQUE: Multidetector CT imaging of the head, cervical spine, and maxillofacial structures were performed using the standard protocol without intravenous contrast. Multiplanar CT image reconstructions of the cervical spine and maxillofacial structures were also generated. RADIATION DOSE REDUCTION: This exam was performed according to the departmental dose-optimization program which includes automated exposure control, adjustment of the mA and/or kV according to patient size and/or use of iterative reconstruction technique. COMPARISON:  CT Head 08/08/21 FINDINGS: CT HEAD FINDINGS Brain: No hemorrhage. No hydrocephalus. No extra-axial fluid collection. Unchanged mineralization of the right thalamus. No CT evidence of an acute cortical infarct. There is sequela of mild chronic microvascular ischemic change. Vascular: No hyperdense vessel or unexpected calcification. Skull: Normal. Negative for fracture or focal lesion. Other: None. CT MAXILLOFACIAL FINDINGS Osseous: No fracture or mandibular dislocation. 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 Orbits: Negative. No traumatic or inflammatory finding. Sinuses: No  middle ear or mastoid effusion. Paranasal sinuses are notable for right maxillary antrostomy and osseous findings suggestive of chronic right maxillary sinusitis. Orbits are unremarkable. Soft tissues: Negative. CT CERVICAL SPINE FINDINGS Alignment: Trace anterolisthesis of C2 on C3. Skull base and vertebrae: No acute fracture. No primary bone lesion or focal pathologic process. Soft tissues and spinal canal: No prevertebral fluid or swelling. No visible canal hematoma. Disc levels:  No evidence of high-grade spinal canal stenosis. Upper chest: Negative. Other: None IMPRESSION: 1. No acute intracranial abnormality. 2. No acute facial bone fracture. 3. No acute fracture or traumatic listhesis of the cervical spine. 4. 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 Electronically Signed   By: Lorenza Cambridge M.D.   On: 01/14/2023 20:02   CT MAXILLOFACIAL WO CONTRAST  Result Date: 01/14/2023 CLINICAL DATA:  Head trauma, moderate-severe; Neck trauma (Age >= 65y) EXAM: CT HEAD WITHOUT CONTRAST CT MAXILLOFACIAL WITHOUT CONTRAST CT CERVICAL SPINE WITHOUT CONTRAST TECHNIQUE: Multidetector CT imaging of the head, cervical spine, and maxillofacial structures were performed using the standard protocol without  intravenous contrast. Multiplanar CT image reconstructions of the cervical spine and maxillofacial structures were also generated. RADIATION DOSE REDUCTION: This exam was performed according to the departmental dose-optimization program which includes automated exposure control, adjustment of the mA and/or kV according to patient size and/or use of iterative reconstruction technique. COMPARISON:  CT Head 08/08/21 FINDINGS: CT HEAD FINDINGS Brain: No hemorrhage. No hydrocephalus. No extra-axial fluid collection. Unchanged mineralization of the right thalamus. No CT evidence of an acute cortical infarct. There is sequela of mild chronic microvascular ischemic change. Vascular: No hyperdense vessel or unexpected  calcification. Skull: Normal. Negative for fracture or focal lesion. Other: None. CT MAXILLOFACIAL FINDINGS Osseous: No fracture or mandibular dislocation. 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 Orbits: Negative. No traumatic or inflammatory finding. Sinuses: No middle ear or mastoid effusion. Paranasal sinuses are notable for right maxillary antrostomy and osseous findings suggestive of chronic right maxillary sinusitis. Orbits are unremarkable. Soft tissues: Negative. CT CERVICAL SPINE FINDINGS Alignment: Trace anterolisthesis of C2 on C3. Skull base and vertebrae: No acute fracture. No primary bone lesion or focal pathologic process. Soft tissues and spinal canal: No prevertebral fluid or swelling. No visible canal hematoma. Disc levels:  No evidence of high-grade spinal canal stenosis. Upper chest: Negative. Other: None IMPRESSION: 1. No acute intracranial abnormality. 2. No acute facial bone fracture. 3. No acute fracture or traumatic listhesis of the cervical spine. 4. 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 Electronically Signed   By: Lorenza Cambridge M.D.   On: 01/14/2023 20:02   DG Humerus Right  Result Date: 01/14/2023 CLINICAL DATA:  Fall EXAM: RIGHT HUMERUS - 2+ VIEW COMPARISON:  None Available. FINDINGS: AC joint is intact. Acute fracture involving the humeral neck with displaced greater tuberosity fracture fragment. No humeral head dislocation IMPRESSION: Acute humeral neck fracture with displaced greater tuberosity fracture fragment. Electronically Signed   By: Jasmine Pang M.D.   On: 01/14/2023 20:01     Positive ROS: All other systems have been reviewed and were otherwise negative with the exception of those mentioned in the HPI and as above.  Physical Exam: BP 119/66 (BP Location: Left Arm)   Pulse 66   Temp 98.2 F (36.8 C)   Resp 16   Ht 5\' 3"  (1.6 m)   Wt 55.1 kg   SpO2 96%   BMI 21.52 kg/m  General:  Alert, no acute distress Psychiatric:   Patient is competent for consent with normal mood and affect    RUE: +ain/pin/u motor SILT r/u/m/ax +rad pulse Arm in sling  X-rays:  As above: There is a minimally displaced proximal humerus fracture with slight displacement of the greater tuberosity.  Alignment of the humeral head appears to be close to normal.  Assessment/Plan: 67 year old female with a minimally displaced right proximal humerus fracture resolved. 1.  Follow-up recommend nonweightbearing on the right upper extremity with sling immobilization.  May remove sling to dangle arm for hygiene purposes.  May perform elbow/wrist/finger range of motion.  No plan for surgical intervention for the right shoulder at this time.  2.  Will defer management of ankle fracture to on-call podiatry team.  3.  Patient may follow-up at [facility] clinic as an outpatient in approximately 1-2 weeks.  Will follow peripherally.  Please page with any questions in the interim.    Signa Kell   01/15/2023 7:35 AM

## 2023-01-15 NOTE — Plan of Care (Signed)
   Problem: Pain Managment: Goal: General experience of comfort will improve Outcome: Progressing   Problem: Elimination: Goal: Will not experience complications related to bowel motility Outcome: Progressing   Problem: Coping: Goal: Level of anxiety will decrease Outcome: Progressing   Problem: Safety: Goal: Ability to remain free from injury will improve Outcome: Progressing   Problem: Skin Integrity: Goal: Risk for impaired skin integrity will decrease Outcome: Progressing

## 2023-01-15 NOTE — Progress Notes (Addendum)
Initial Nutrition Assessment  DOCUMENTATION CODES:   Non-severe (moderate) malnutrition in context of social or environmental circumstances  INTERVENTION:   Boost Breeze po TID, each supplement provides 250 kcal and 9 grams of protein  MVI, folic acid and thiamine po daily   Pt at high refeed risk; recommend monitor potassium, magnesium and phosphorus labs daily until stable  Daily weights   Dysphagia 3 diet   NUTRITION DIAGNOSIS:   Moderate Malnutrition related to social / environmental circumstances (etoh abuse) as evidenced by moderate fat depletion, moderate muscle depletion, severe muscle depletion.  GOAL:   Patient will meet greater than or equal to 90% of their needs  MONITOR:   PO intake, Supplement acceptance, Labs, Weight trends, I & O's, Skin  REASON FOR ASSESSMENT:   Consult Assessment of nutrition requirement/status  ASSESSMENT:   67 y/o female with h/o HTN, etoh abuse and HLD who is admitted with closed right trimalleolar fracture s/p fracture reduction in the ED and closed fracture of right proximal humerus after a fall.  Met with pt in room today. Pt reports good appetite and oral intake at baseline but reports poor oral intake in hospital r/t pain. Pt reports that she is in too much pain to eat. Pt also reports poor dentition making it difficult for her to chew. Pt's lunch tray is sitting on her side table untouched. RD discussed with pt the importance of adequate nutrition needed to preserve lean muscle and to support healing. Pt reports that she does not like Boost or Ensure. Pt reports that she can only eat small amounts of ice cream as she is lactose intolerant. Pt is willing to try Boost Breeze. Pt is at high refeed risk secondary to etoh abuse. Per chart, pt appears weight stable at baseline.     Medications reviewed and include: folic acid, nicotine, KCl, thiamine, hydromorphone   Labs reviewed: K 3.2(L) Hgb 9.4(L), Hct 29.3(L)  NUTRITION -  FOCUSED PHYSICAL EXAM:  Flowsheet Row Most Recent Value  Orbital Region Mild depletion  Upper Arm Region Moderate depletion  Thoracic and Lumbar Region Moderate depletion  Buccal Region Mild depletion  Temple Region Mild depletion  Clavicle Bone Region Moderate depletion  Clavicle and Acromion Bone Region Moderate depletion  Scapular Bone Region Moderate depletion  Dorsal Hand Severe depletion  Patellar Region Severe depletion  Anterior Thigh Region Severe depletion  Posterior Calf Region Severe depletion  Edema (RD Assessment) None  Hair Reviewed  Eyes Reviewed  Mouth Reviewed  Skin Reviewed  Nails Reviewed   Diet Order:   Diet Order             DIET DYS 3 Room service appropriate? Yes; Fluid consistency: Thin  Diet effective now                  EDUCATION NEEDS:   Education needs have been addressed  Skin:  Skin Assessment: Reviewed RN Assessment (ecchymosis)  Last BM:  7/22  Height:   Ht Readings from Last 1 Encounters:  01/14/23 5\' 3"  (1.6 m)    Weight:   Wt Readings from Last 1 Encounters:  01/14/23 55.1 kg    Ideal Body Weight:  52.2 kg  BMI:  Body mass index is 21.52 kg/m.  Estimated Nutritional Needs:   Kcal:  1400-1600kcal/day  Protein:  70-80g/day  Fluid:  1.3-1.5L/day  Betsey Holiday MS, RD, LDN Please refer to [facility] for RD and/or RD on-call/weekend/after hours pager

## 2023-01-15 NOTE — H&P (View-Only) (Signed)
PODIATRY CONSULTATION  NAME Becky Ward MRN [id number] DOB 23-Mar-1956 DOA 01/14/2023   Reason for consult: Trimalleolar ankle fracture right Chief Complaint  Patient presents with   Fall   Alcohol Intoxication    Consulting physician: Lindajo Royal MD, [facility]  History of present illness: 67 y.o. female PMHx HTN, alcohol use disorder, nicotine dependence, admitted to [facility] secondary to a fall on her right side while walking her dogs.  DOI: 01/14/2023.  Humeral fracture being treated conservatively with sling.  Due to the severe instability and comminution of the right ankle joint, patient was admitted.  Podiatry consulted.  ED course:Hypotensive on arrival at 68/50, fluid responsive to 115/77.  Intermittent bradycardia to the high 40s and low 50s but for the most part in the 60s to 70s.  Vitals otherwise within normal limits.  Labs notable for EtOH level of 215, potassium 3.3.  WBC 13,000, hemoglobin 10.8. EKG, personally viewed and interpreted showing sinus rhythm at 59 with nonspecific ST-T wave changes. Extensive trauma imaging to include CT head, C-spine, maxillofacial and right ankle as well as x-rays of the right humerus right tib-fib right ankle, pelvis and chest x-ray  Past Medical History:  Diagnosis Date   Colitis    Hypertension        Latest Ref Rng & Units 01/15/2023    2:04 PM 01/14/2023    6:40 PM 07/20/2022    1:19 PM  CBC  WBC 4.0 - 10.5 K/uL 8.4  13.8  7.7   Hemoglobin 12.0 - 15.0 g/dL 9.4  40.9  81.1   Hematocrit 36.0 - 46.0 % 29.3  32.8  34.4   Platelets 150 - 400 K/uL 375  483  431        Latest Ref Rng & Units 01/15/2023    2:04 PM 01/14/2023    6:40 PM 07/20/2022    1:19 PM  BMP  Glucose 70 - 99 mg/dL 914  782  97   BUN 8 - 23 mg/dL 13  10  10    Creatinine 0.44 - 1.00 mg/dL 9.56  2.13  0.86   Sodium 135 - 145 mmol/L 135  138  139   Potassium 3.5 - 5.1 mmol/L 3.2  3.3  3.8   Chloride 98 - 111 mmol/L 106  106  104   CO2 22 - 32  mmol/L 23  19  24    Calcium 8.9 - 10.3 mg/dL 7.9  8.2  8.3       Physical Exam: General: The patient is alert and oriented x3 in no acute distress.   Dermatology: Skin is warm, dry and supple bilateral lower extremities.  No open wounds reported  Vascular: Capillary refill to the toes WNL.  Neurological: Grossly intact via light touch  Musculoskeletal Exam: Posterior splint left intact today.  Patient states that the posterior splint is comfortable/tolerable  Radiographic exam RT ankle prereduction 01/15/2023 FINDINGS: Acute mildly comminuted trimalleolar fracture with posterior dislocation of the talus with respect to the distal tibia. Distal fibular and posterior malleolar fracture fragments are displaced. Copious soft tissue swelling. IMPRESSION: Acute trimalleolar fracture with posterior dislocation of the talus.  Radiographic exam RT ankle postreduction 01/15/2023 IMPRESSION: Status post reduction and casting redemonstrate trimalleolar fractures. Interval partial improvement of the posterior talar dislocation.  CT ANKLE RIGHT WO CONTRAST 01/15/2023 IMPRESSION: 1. Acute comminuted medial malleolus fracture. 2. Acute displaced comminuted distal fibular fracture. 3. Acute mildly comminuted posterior malleolar fracture. 4. Anterior dislocation of the anterior talus in relation  to the talar dome. 5. The posterior aspect of the distal tibia approximates the anterior talus. The talar dome appears intact.   ASSESSMENT/PLAN OF CARE Closed comminuted displaced trimalleolar ankle fracture right.  DOI: 01/14/2023  -Patient evaluated.  Imaging reviewed in detail as well as the patient's past medical history -Patient requires surgical ORIF of the ankle fracture.  This was explained in detail to the patient and she is amenable to this plan.  Risk benefits advantages and disadvantages of the procedure as well as the postoperative recovery course were explained in detail to the patient.   No guarantees were expressed or implied.  All patient questions were answered.  -Due to the comminution throughout the ankle joint and instability, history of alcohol abuse with possible noncompliance and weightbearing, I do believe the patient would benefit from both internal and external fixation to minimize the chances of complications if the patient is noncompliant and weightbears.   -The orthopedic hardware, external fixation device, has been ordered and should be in the hospital tomorrow, 01/16/2023.  Will plan for surgery the following day 01/17/2023. -In the meantime compression with elevation and ice. -Due to the humeral fracture as well as the severe comminution of ankle fracture recommend that she stays inpatient with possible discharge to rehab facility postoperatively. -Continue pain management as per standing orders -Will continue to follow  Thank you for the consult.  Please contact me directly via secure chat with any questions or concerns.     [name], DPM [facility]  Dr. [name], DPM    [address] [place], [place] [address]                Office [phone]  Fax [phone]

## 2023-01-15 NOTE — Consult Note (Addendum)
PODIATRY CONSULTATION  NAME Becky Ward MRN [id number] DOB 23-Mar-1956 DOA 01/14/2023   Reason for consult: Trimalleolar ankle fracture right Chief Complaint  Patient presents with   Fall   Alcohol Intoxication    Consulting physician: Lindajo Royal MD, [facility]  History of present illness: 67 y.o. female PMHx HTN, alcohol use disorder, nicotine dependence, admitted to [facility] secondary to a fall on her right side while walking her dogs.  DOI: 01/14/2023.  Humeral fracture being treated conservatively with sling.  Due to the severe instability and comminution of the right ankle joint, patient was admitted.  Podiatry consulted.  ED course:Hypotensive on arrival at 68/50, fluid responsive to 115/77.  Intermittent bradycardia to the high 40s and low 50s but for the most part in the 60s to 70s.  Vitals otherwise within normal limits.  Labs notable for EtOH level of 215, potassium 3.3.  WBC 13,000, hemoglobin 10.8. EKG, personally viewed and interpreted showing sinus rhythm at 59 with nonspecific ST-T wave changes. Extensive trauma imaging to include CT head, C-spine, maxillofacial and right ankle as well as x-rays of the right humerus right tib-fib right ankle, pelvis and chest x-ray  Past Medical History:  Diagnosis Date   Colitis    Hypertension        Latest Ref Rng & Units 01/15/2023    2:04 PM 01/14/2023    6:40 PM 07/20/2022    1:19 PM  CBC  WBC 4.0 - 10.5 K/uL 8.4  13.8  7.7   Hemoglobin 12.0 - 15.0 g/dL 9.4  40.9  81.1   Hematocrit 36.0 - 46.0 % 29.3  32.8  34.4   Platelets 150 - 400 K/uL 375  483  431        Latest Ref Rng & Units 01/15/2023    2:04 PM 01/14/2023    6:40 PM 07/20/2022    1:19 PM  BMP  Glucose 70 - 99 mg/dL 914  782  97   BUN 8 - 23 mg/dL 13  10  10    Creatinine 0.44 - 1.00 mg/dL 9.56  2.13  0.86   Sodium 135 - 145 mmol/L 135  138  139   Potassium 3.5 - 5.1 mmol/L 3.2  3.3  3.8   Chloride 98 - 111 mmol/L 106  106  104   CO2 22 - 32  mmol/L 23  19  24    Calcium 8.9 - 10.3 mg/dL 7.9  8.2  8.3       Physical Exam: General: The patient is alert and oriented x3 in no acute distress.   Dermatology: Skin is warm, dry and supple bilateral lower extremities.  No open wounds reported  Vascular: Capillary refill to the toes WNL.  Neurological: Grossly intact via light touch  Musculoskeletal Exam: Posterior splint left intact today.  Patient states that the posterior splint is comfortable/tolerable  Radiographic exam RT ankle prereduction 01/15/2023 FINDINGS: Acute mildly comminuted trimalleolar fracture with posterior dislocation of the talus with respect to the distal tibia. Distal fibular and posterior malleolar fracture fragments are displaced. Copious soft tissue swelling. IMPRESSION: Acute trimalleolar fracture with posterior dislocation of the talus.  Radiographic exam RT ankle postreduction 01/15/2023 IMPRESSION: Status post reduction and casting redemonstrate trimalleolar fractures. Interval partial improvement of the posterior talar dislocation.  CT ANKLE RIGHT WO CONTRAST 01/15/2023 IMPRESSION: 1. Acute comminuted medial malleolus fracture. 2. Acute displaced comminuted distal fibular fracture. 3. Acute mildly comminuted posterior malleolar fracture. 4. Anterior dislocation of the anterior talus in relation  to the talar dome. 5. The posterior aspect of the distal tibia approximates the anterior talus. The talar dome appears intact.   ASSESSMENT/PLAN OF CARE Closed comminuted displaced trimalleolar ankle fracture right.  DOI: 01/14/2023  -Patient evaluated.  Imaging reviewed in detail as well as the patient's past medical history -Patient requires surgical ORIF of the ankle fracture.  This was explained in detail to the patient and she is amenable to this plan.  Risk benefits advantages and disadvantages of the procedure as well as the postoperative recovery course were explained in detail to the patient.   No guarantees were expressed or implied.  All patient questions were answered.  -Due to the comminution throughout the ankle joint and instability, history of alcohol abuse with possible noncompliance and weightbearing, I do believe the patient would benefit from both internal and external fixation to minimize the chances of complications if the patient is noncompliant and weightbears.   -The orthopedic hardware, external fixation device, has been ordered and should be in the hospital tomorrow, 01/16/2023.  Will plan for surgery the following day 01/17/2023. -In the meantime compression with elevation and ice. -Due to the humeral fracture as well as the severe comminution of ankle fracture recommend that she stays inpatient with possible discharge to rehab facility postoperatively. -Continue pain management as per standing orders -Will continue to follow  Thank you for the consult.  Please contact me directly via secure chat with any questions or concerns.     [name], DPM [facility]  Dr. [name], DPM    [address] [place], [place] [address]                Office [phone]  Fax [phone]

## 2023-01-15 NOTE — Discharge Instructions (Addendum)
Intensive Outpatient Programs   [facility] [address] [place],  [place], [place] [phone]  [facility] (Inpatient and outpatient)[phone] (Suboxone and Methadone) [address] [phone]  [facility] Programs - Intensive Outpatient [address] [place], [place] [address]  [phone]  [facility] (Outpatient, Inpatient, [facility] (Groups and Residental) (insurance only) [phone] [place], [place] [phone]   [facility] (for caregivers and family) [address] Laurell Josephs [address], [place], [place] [phone]  Residential Treatment Programs  [facility](2 years) Residential: 75 days)[facility] [address] [place], [place], [place] [phone] or [phone]  [facility] [address] [place], [place], [place] [phone]  [facility] ([facility] [address] [place], [place], [place] [phone] Admissions: 8am-3pm M-F  [facility] Program: Residential Program [phone] Days)             [facility]: [facility]  [place], [place], [place]  [phone] or [phone] in Hours over the weekend or by referral)  [facility] [address] [place], [place] [address] [phone] (Do virtual or phone assessment, offer transportation within 25 miles, have in patient and Outpatient options)   [facility] Crisis: [facility]:[phone] (for crisis  response 24 hours a day)    [facility] will call you to set up when they will come

## 2023-01-16 DIAGNOSIS — E440 Moderate protein-calorie malnutrition: Secondary | ICD-10-CM | POA: Insufficient documentation

## 2023-01-16 DIAGNOSIS — S82851A Displaced trimalleolar fracture of right lower leg, initial encounter for closed fracture: Secondary | ICD-10-CM | POA: Diagnosis not present

## 2023-01-16 LAB — CBC WITH DIFFERENTIAL/PLATELET
Abs Immature Granulocytes: 0.02 10*3/uL (ref 0.00–0.07)
Basophils Absolute: 0 10*3/uL (ref 0.0–0.1)
Basophils Relative: 1 %
Eosinophils Absolute: 0.4 10*3/uL (ref 0.0–0.5)
Eosinophils Relative: 5 %
HCT: 27.5 % — ABNORMAL LOW (ref 36.0–46.0)
Hemoglobin: 9.2 g/dL — ABNORMAL LOW (ref 12.0–15.0)
Immature Granulocytes: 0 %
Lymphocytes Relative: 19 %
Lymphs Abs: 1.4 10*3/uL (ref 0.7–4.0)
MCH: 33.3 pg (ref 26.0–34.0)
MCHC: 33.5 g/dL (ref 30.0–36.0)
MCV: 99.6 fL (ref 80.0–100.0)
Monocytes Absolute: 0.8 10*3/uL (ref 0.1–1.0)
Monocytes Relative: 11 %
Neutro Abs: 4.8 10*3/uL (ref 1.7–7.7)
Neutrophils Relative %: 64 %
Platelets: 351 10*3/uL (ref 150–400)
RBC: 2.76 MIL/uL — ABNORMAL LOW (ref 3.87–5.11)
RDW: 14.2 % (ref 11.5–15.5)
WBC: 7.5 10*3/uL (ref 4.0–10.5)
nRBC: 0 % (ref 0.0–0.2)

## 2023-01-16 LAB — BASIC METABOLIC PANEL
Anion gap: 5 (ref 5–15)
BUN: 11 mg/dL (ref 8–23)
CO2: 22 mmol/L (ref 22–32)
Calcium: 8 mg/dL — ABNORMAL LOW (ref 8.9–10.3)
Chloride: 108 mmol/L (ref 98–111)
Creatinine, Ser: 0.53 mg/dL (ref 0.44–1.00)
GFR, Estimated: >60 mL/min (ref >60)
Glucose, Bld: 100 mg/dL — ABNORMAL HIGH (ref 70–99)
Potassium: 4.4 mmol/L (ref 3.5–5.1)
Sodium: 135 mmol/L (ref 135–145)

## 2023-01-16 LAB — MAGNESIUM: Magnesium: 2.3 mg/dL (ref 1.7–2.4)

## 2023-01-16 LAB — PHOSPHORUS: Phosphorus: 2 mg/dL — ABNORMAL LOW (ref 2.5–4.6)

## 2023-01-16 MED ORDER — HYDROCODONE-ACETAMINOPHEN 5-325 MG PO TABS
2.0000 | ORAL_TABLET | Freq: Four times a day (QID) | ORAL | Status: DC
  Administered 2023-01-16 – 2023-01-18 (×7): 2 via ORAL
  Filled 2023-01-16 (×8): qty 2

## 2023-01-16 MED ORDER — CALCIUM CARBONATE 1250 (500 CA) MG PO TABS
1000.0000 mg | ORAL_TABLET | Freq: Two times a day (BID) | ORAL | Status: AC
  Administered 2023-01-16 – 2023-01-17 (×3): 2500 mg via ORAL
  Filled 2023-01-16 (×4): qty 2

## 2023-01-16 MED ORDER — K PHOS MONO-SOD PHOS DI & MONO 155-852-130 MG PO TABS
500.0000 mg | ORAL_TABLET | Freq: Two times a day (BID) | ORAL | Status: AC
  Administered 2023-01-16 – 2023-01-17 (×3): 500 mg via ORAL
  Filled 2023-01-16 (×3): qty 2

## 2023-01-16 NOTE — Plan of Care (Signed)

## 2023-01-16 NOTE — Progress Notes (Signed)
PROGRESS NOTE    Becky Ward   UEA:[id number] DOB: 1956-01-31  DOA: 01/14/2023 Date of Service: 01/16/23 PCP: Margit Hanks, MD     Brief Narrative / Hospital Course:  Becky Ward is a 67 y.o. female with medical history significant for 67 year old female with a history of hypertension, alcohol use disorder and nicotine dependence who presents to the ED via EMS following a fall onto her right side while walking her dogs, after her dogs ran into another dog.  She landed on her right shoulder with immediate pain of the right shoulder and twisted her ankle and has been unable to walk since the incident.  She hit her head but denies loss of consciousness.  07/22: To ED and admitted to hospitalist service for treatment closed right trimalleolar fracture and right proximal humerus fracture. 07/23: Podiatry and orthopedics evaluated the patient.  Podiatry planning for surgery 07/24: Surgical intervention pending  Consultants:  Orthopedics Podiatry  Procedures: none      ASSESSMENT & PLAN:   Principal Problem:   Closed right trimalleolar fracture, initial encounter Active Problems:   Closed fracture of right proximal humerus   Alcohol use disorder   Alcohol intoxication (HCC)   Hypokalemia   Anemia, mild   Benign essential HTN   Malnutrition of moderate degree   Closed right trimalleolar fracture, initial encounter S/p fracture reduction in the ED Podiatry planning fixation surgery likely Thursday or Friday Pain control in the interim   Closed fracture of right proximal humerus Orthopedics, Dr. Signa Kell consulted Maintain sling as recommended from the ED At this point orthopedics is not planning any surgical intervention for right shoulder fracture To continue nonweightbearing and pain control    Alcohol use disorder Alcohol intoxication EtOH level was 214 CIWA withdrawal protocol Folic acid, thiamine  Hypokalemia Replace as needed Monitor BMP    Anemia, mild Hemoglobin 10.8 near baseline of 11.1 Monitor periodic CBC   Benign essential HTN Normotensive to soft Hold antihypertensives  Malnutrition, moderate Dietary following Electrolytes monitored closely   DVT prophylaxis: SCD Pertinent IV fluids/nutrition: No continuous IV fluids Central lines / invasive devices: Foley catheter in place  Code Status: Full code ACP documentation reviewed: Reviewed 01/16/2023, none on file  Current Admission Status: Inpatient TOC needs / Dispo plan: Likely will need SNF placement for rehab Barriers to discharge / significant pending items: Surgical fixation right ankle and will consult TOC for likely placement             Subjective / Brief ROS:  Patient reports overall doing well today compared to yesterday, is a bit bothered by shoulder sling being loose Denies CP/SOB.  Pain usually controlled okay but could be better  Denies new weakness.  Tolerating diet.  Reports no concerns w/ urination/defecation.   Family Communication: none at this time    Objective Findings:  Vitals:   01/15/23 2305 01/16/23 0500 01/16/23 0729 01/16/23 1538  BP: 127/88  126/76 111/74  Pulse: 73  71 83  Resp: 20  14 16   Temp: 98.4 F (36.9 C)  98.7 F (37.1 C) 99 F (37.2 C)  TempSrc:      SpO2: 95%  93% 100%  Weight:  59.9 kg    Height:        Intake/Output Summary (Last 24 hours) at 01/16/2023 1603 Last data filed at 01/16/2023 1337 Gross per 24 hour  Intake --  Output 900 ml  Net -900 ml   Filed Weights   01/14/23 1834 01/14/23 2335 01/16/23  0500  Weight: 52.2 kg 55.1 kg 59.9 kg    Examination:  Physical Exam Constitutional:      General: She is not in acute distress. Cardiovascular:     Rate and Rhythm: Normal rate and regular rhythm.  Pulmonary:     Effort: Pulmonary effort is normal.     Breath sounds: Normal breath sounds.  Abdominal:     General: Abdomen is flat.     Palpations: Abdomen is soft.   Musculoskeletal:     Right lower leg: No edema.     Left lower leg: No edema.  Skin:    General: Skin is warm and dry.  Neurological:     Mental Status: She is alert and oriented to person, place, and time.  Psychiatric:        Mood and Affect: Mood normal.        Behavior: Behavior normal.          Scheduled Medications:   calcium carbonate  1,000 mg of elemental calcium Oral BID WC   feeding supplement  1 Container Oral TID BM   folic acid  1 mg Oral Daily   HYDROcodone-acetaminophen  2 tablet Oral Q6H   mupirocin ointment  1 Application Nasal BID   nicotine  7 mg Transdermal Daily   phosphorus  500 mg Oral BID   thiamine  100 mg Oral Daily   Or   thiamine  100 mg Intravenous Daily    Continuous Infusions:   PRN Medications:  LORazepam **OR** LORazepam, morphine injection, senna-docusate  Antimicrobials from admission:  Anti-infectives (From admission, onward)    None           Data Reviewed:  I have personally reviewed the following...  CBC: Recent Labs  Lab 01/14/23 1840 01/15/23 1404 01/16/23 0511  WBC 13.8* 8.4 7.5  NEUTROABS 10.3* 6.1 4.8  HGB 10.8* 9.4* 9.2*  HCT 32.8* 29.3* 27.5*  MCV 98.8 101.0* 99.6  PLT 483* 375 351   Basic Metabolic Panel: Recent Labs  Lab 01/14/23 1840 01/15/23 1404 01/16/23 0511  NA 138 135 135  K 3.3* 3.2* 4.4  CL 106 106 108  CO2 19* 23 22  GLUCOSE 104* 146* 100*  BUN 10 13 11   CREATININE 0.74 0.73 0.53  CALCIUM 8.2* 7.9* 8.0*  MG  --   --  2.3  PHOS  --   --  2.0*   GFR: Estimated Creatinine Clearance: 56.4 mL/min (by C-G formula based on SCr of 0.53 mg/dL). Liver Function Tests: Recent Labs  Lab 01/14/23 1840  AST 30  ALT 21  ALKPHOS 56  BILITOT 0.3  PROT 6.7  ALBUMIN 4.1   No results for input(s): "LIPASE", "AMYLASE" in the last 168 hours. No results for input(s): "AMMONIA" in the last 168 hours. Coagulation Profile: Recent Labs  Lab 01/14/23 1856  INR 1.2   Cardiac  Enzymes: No results for input(s): "CKTOTAL", "CKMB", "CKMBINDEX", "TROPONINI" in the last 168 hours. BNP (last 3 results) No results for input(s): "PROBNP" in the last 8760 hours. HbA1C: No results for input(s): "HGBA1C" in the last 72 hours. CBG: Recent Labs  Lab 01/14/23 1852  GLUCAP 97   Lipid Profile: No results for input(s): "CHOL", "HDL", "LDLCALC", "TRIG", "CHOLHDL", "LDLDIRECT" in the last 72 hours. Thyroid Function Tests: No results for input(s): "TSH", "T4TOTAL", "FREET4", "T3FREE", "THYROIDAB" in the last 72 hours. Anemia Panel: No results for input(s): "VITAMINB12", "FOLATE", "FERRITIN", "TIBC", "IRON", "RETICCTPCT" in the last 72 hours. Most Recent Urinalysis On  File:     Component Value Date/Time   COLORURINE STRAW (A) 08/08/2021 0610   APPEARANCEUR CLEAR (A) 08/08/2021 0610   APPEARANCEUR Clear 01/06/2014 0909   LABSPEC 1.003 (L) 08/08/2021 0610   LABSPEC 1.017 01/06/2014 0909   PHURINE 5.0 08/08/2021 0610   GLUCOSEU NEGATIVE 08/08/2021 0610   GLUCOSEU Negative 01/06/2014 0909   HGBUR NEGATIVE 08/08/2021 0610   BILIRUBINUR NEGATIVE 08/08/2021 0610   BILIRUBINUR Negative 01/06/2014 0909   KETONESUR NEGATIVE 08/08/2021 0610   PROTEINUR NEGATIVE 08/08/2021 0610   NITRITE NEGATIVE 08/08/2021 0610   LEUKOCYTESUR NEGATIVE 08/08/2021 0610   LEUKOCYTESUR 2+ 01/06/2014 0909   Sepsis Labs: @LABRCNTIP (procalcitonin:4,lacticidven:4) Microbiology: Recent Results (from the past 240 hour(s))  Surgical PCR screen     Status: None   Collection Time: 01/15/23  5:19 AM   Specimen: Nasal Mucosa; Nasal Swab  Result Value Ref Range Status   MRSA, PCR NEGATIVE NEGATIVE Final   Staphylococcus aureus NEGATIVE NEGATIVE Final    Comment: (NOTE) The Xpert SA Assay (FDA approved for NASAL specimens in patients 36 years of age and older), is one component of a comprehensive surveillance program. It is not intended to diagnose infection nor to guide or monitor  treatment. Performed at [facility], [address] [place], [place] [address]       Radiology Studies last 3 days: CT ANKLE RIGHT WO CONTRAST  Result Date: 01/14/2023 CLINICAL DATA:  Trauma EXAM: CT OF THE RIGHT ANKLE WITHOUT CONTRAST TECHNIQUE: Multidetector CT imaging of the right ankle was performed according to the standard protocol. Multiplanar CT image reconstructions were also generated. RADIATION DOSE REDUCTION: This exam was performed according to the departmental dose-optimization program which includes automated exposure control, adjustment of the mA and/or kV according to patient size and/or use of iterative reconstruction technique. COMPARISON:  Right ankle x-ray 01/14/2023 FINDINGS: Bones/Joint/Cartilage The bones are osteopenic. There is an acute comminuted medial malleolus fracture. The tip of the medial malleolus is displaced inferiorly 6 mm. There is also an acute distal fibular fracture which is markedly comminuted at the level of the ankle mortise. The tip of the lateral malleolus as well aligned, but the proximal fracture fragment is displaced anteriorly 1 cm in there is 1 cm of overlap. There is anterior dislocation of the anterior talus in relation to the talar dome. There is a mildly comminuted posterior malleolar fracture. The posterior malleolus as well aligned. The posterior aspect of the distal tibia approximate the anterior talus. The talar dome appears intact. Ligaments Suboptimally assessed by CT. Muscles and Tendons There is intramuscular edema and edema surrounding tendons in the fracture sites. No foreign body. Soft tissues There is subcutaneous edema and swelling surrounding the ankle and lateral aspect of the foot. IMPRESSION: 1. Acute comminuted medial malleolus fracture. 2. Acute displaced comminuted distal fibular fracture. 3. Acute mildly comminuted posterior malleolar fracture. 4. Anterior dislocation of the anterior talus in relation to the talar dome.  5. The posterior aspect of the distal tibia approximates the anterior talus. The talar dome appears intact. Electronically Signed   By: Darliss Cheney M.D.   On: 01/14/2023 21:52   DG Ankle 2 Views Right  Result Date: 01/14/2023 CLINICAL DATA:  Postreduction EXAM: RIGHT ANKLE - 2 VIEW COMPARISON:  Radiographs dated January 14, 2023 FINDINGS: Status post reduction and casting redemonstrate trimalleolar fractures. Interval partial improvement of the posterior talar dislocation. IMPRESSION: Status post reduction and casting redemonstrate trimalleolar fractures. Interval partial improvement of the posterior talar dislocation. Electronically Signed  By: Larose Hires D.O.   On: 01/14/2023 21:15   DG Chest Portable 1 View  Result Date: 01/14/2023 CLINICAL DATA:  Fall EXAM: PORTABLE CHEST 1 VIEW COMPARISON:  08/08/2021 FINDINGS: The heart size and mediastinal contours are within normal limits. Aortic atherosclerosis. Both lungs are clear. Acute proximal right humerus fracture. IMPRESSION: No active disease. Acute proximal right humerus fracture. Electronically Signed   By: Jasmine Pang M.D.   On: 01/14/2023 20:09   DG Ankle Right Port  Result Date: 01/14/2023 CLINICAL DATA:  Fall with ankle pain EXAM: PORTABLE RIGHT ANKLE - 2 VIEW COMPARISON:  None Available. FINDINGS: Acute mildly comminuted trimalleolar fracture with posterior dislocation of the talus with respect to the distal tibia. Distal fibular and posterior malleolar fracture fragments are displaced. Copious soft tissue swelling. IMPRESSION: Acute trimalleolar fracture with posterior dislocation of the talus. Electronically Signed   By: Jasmine Pang M.D.   On: 01/14/2023 20:08   DG Pelvis Portable  Result Date: 01/14/2023 CLINICAL DATA:  Fall EXAM: PORTABLE PELVIS 1-2 VIEWS COMPARISON:  None Available. FINDINGS: SI joints are non widened. Pubic symphysis and rami appear intact. Mild chronic sclerosis at the humeral heads. No fracture or dislocation  IMPRESSION: No acute osseous abnormality Electronically Signed   By: Jasmine Pang M.D.   On: 01/14/2023 20:03   DG Tibia/Fibula Right  Result Date: 01/14/2023 CLINICAL DATA:  Fall EXAM: RIGHT TIBIA AND FIBULA - 2 VIEW COMPARISON:  None Available. FINDINGS: Proximal and mid tibia and fibula appear intact. Acute fracture dislocation at the ankle. Diffuse soft tissue swelling IMPRESSION: Acute fracture dislocation at the ankle. See separately dictated ankle report Electronically Signed   By: Jasmine Pang M.D.   On: 01/14/2023 20:02   CT HEAD WO CONTRAST ( )  Result Date: 01/14/2023 CLINICAL DATA:  Head trauma, moderate-severe; Neck trauma (Age >= 65y) EXAM: CT HEAD WITHOUT CONTRAST CT MAXILLOFACIAL WITHOUT CONTRAST CT CERVICAL SPINE WITHOUT CONTRAST TECHNIQUE: Multidetector CT imaging of the head, cervical spine, and maxillofacial structures were performed using the standard protocol without intravenous contrast. Multiplanar CT image reconstructions of the cervical spine and maxillofacial structures were also generated. RADIATION DOSE REDUCTION: This exam was performed according to the departmental dose-optimization program which includes automated exposure control, adjustment of the mA and/or kV according to patient size and/or use of iterative reconstruction technique. COMPARISON:  CT Head 08/08/21 FINDINGS: CT HEAD FINDINGS Brain: No hemorrhage. No hydrocephalus. No extra-axial fluid collection. Unchanged mineralization of the right thalamus. No CT evidence of an acute cortical infarct. There is sequela of mild chronic microvascular ischemic change. Vascular: No hyperdense vessel or unexpected calcification. Skull: Normal. Negative for fracture or focal lesion. Other: None. CT MAXILLOFACIAL FINDINGS Osseous: No fracture or mandibular dislocation. 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 Orbits: Negative. No traumatic or inflammatory finding. Sinuses: No middle ear or mastoid effusion.  Paranasal sinuses are notable for right maxillary antrostomy and osseous findings suggestive of chronic right maxillary sinusitis. Orbits are unremarkable. Soft tissues: Negative. CT CERVICAL SPINE FINDINGS Alignment: Trace anterolisthesis of C2 on C3. Skull base and vertebrae: No acute fracture. No primary bone lesion or focal pathologic process. Soft tissues and spinal canal: No prevertebral fluid or swelling. No visible canal hematoma. Disc levels:  No evidence of high-grade spinal canal stenosis. Upper chest: Negative. Other: None IMPRESSION: 1. No acute intracranial abnormality. 2. No acute facial bone fracture. 3. No acute fracture or traumatic listhesis of the cervical spine. 4. 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 Electronically  Signed   By: Lorenza Cambridge M.D.   On: 01/14/2023 20:02   CT Cervical Spine Wo Contrast  Result Date: 01/14/2023 CLINICAL DATA:  Head trauma, moderate-severe; Neck trauma (Age >= 65y) EXAM: CT HEAD WITHOUT CONTRAST CT MAXILLOFACIAL WITHOUT CONTRAST CT CERVICAL SPINE WITHOUT CONTRAST TECHNIQUE: Multidetector CT imaging of the head, cervical spine, and maxillofacial structures were performed using the standard protocol without intravenous contrast. Multiplanar CT image reconstructions of the cervical spine and maxillofacial structures were also generated. RADIATION DOSE REDUCTION: This exam was performed according to the departmental dose-optimization program which includes automated exposure control, adjustment of the mA and/or kV according to patient size and/or use of iterative reconstruction technique. COMPARISON:  CT Head 08/08/21 FINDINGS: CT HEAD FINDINGS Brain: No hemorrhage. No hydrocephalus. No extra-axial fluid collection. Unchanged mineralization of the right thalamus. No CT evidence of an acute cortical infarct. There is sequela of mild chronic microvascular ischemic change. Vascular: No hyperdense vessel or unexpected calcification. Skull: Normal.  Negative for fracture or focal lesion. Other: None. CT MAXILLOFACIAL FINDINGS Osseous: No fracture or mandibular dislocation. 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 Orbits: Negative. No traumatic or inflammatory finding. Sinuses: No middle ear or mastoid effusion. Paranasal sinuses are notable for right maxillary antrostomy and osseous findings suggestive of chronic right maxillary sinusitis. Orbits are unremarkable. Soft tissues: Negative. CT CERVICAL SPINE FINDINGS Alignment: Trace anterolisthesis of C2 on C3. Skull base and vertebrae: No acute fracture. No primary bone lesion or focal pathologic process. Soft tissues and spinal canal: No prevertebral fluid or swelling. No visible canal hematoma. Disc levels:  No evidence of high-grade spinal canal stenosis. Upper chest: Negative. Other: None IMPRESSION: 1. No acute intracranial abnormality. 2. No acute facial bone fracture. 3. No acute fracture or traumatic listhesis of the cervical spine. 4. 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 Electronically Signed   By: Lorenza Cambridge M.D.   On: 01/14/2023 20:02   CT MAXILLOFACIAL WO CONTRAST  Result Date: 01/14/2023 CLINICAL DATA:  Head trauma, moderate-severe; Neck trauma (Age >= 65y) EXAM: CT HEAD WITHOUT CONTRAST CT MAXILLOFACIAL WITHOUT CONTRAST CT CERVICAL SPINE WITHOUT CONTRAST TECHNIQUE: Multidetector CT imaging of the head, cervical spine, and maxillofacial structures were performed using the standard protocol without intravenous contrast. Multiplanar CT image reconstructions of the cervical spine and maxillofacial structures were also generated. RADIATION DOSE REDUCTION: This exam was performed according to the departmental dose-optimization program which includes automated exposure control, adjustment of the mA and/or kV according to patient size and/or use of iterative reconstruction technique. COMPARISON:  CT Head 08/08/21 FINDINGS: CT HEAD FINDINGS Brain: No hemorrhage. No  hydrocephalus. No extra-axial fluid collection. Unchanged mineralization of the right thalamus. No CT evidence of an acute cortical infarct. There is sequela of mild chronic microvascular ischemic change. Vascular: No hyperdense vessel or unexpected calcification. Skull: Normal. Negative for fracture or focal lesion. Other: None. CT MAXILLOFACIAL FINDINGS Osseous: No fracture or mandibular dislocation. 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 Orbits: Negative. No traumatic or inflammatory finding. Sinuses: No middle ear or mastoid effusion. Paranasal sinuses are notable for right maxillary antrostomy and osseous findings suggestive of chronic right maxillary sinusitis. Orbits are unremarkable. Soft tissues: Negative. CT CERVICAL SPINE FINDINGS Alignment: Trace anterolisthesis of C2 on C3. Skull base and vertebrae: No acute fracture. No primary bone lesion or focal pathologic process. Soft tissues and spinal canal: No prevertebral fluid or swelling. No visible canal hematoma. Disc levels:  No evidence of high-grade spinal canal stenosis. Upper chest: Negative. Other:  None IMPRESSION: 1. No acute intracranial abnormality. 2. No acute facial bone fracture. 3. No acute fracture or traumatic listhesis of the cervical spine. 4. 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 Electronically Signed   By: Lorenza Cambridge M.D.   On: 01/14/2023 20:02   DG Humerus Right  Result Date: 01/14/2023 CLINICAL DATA:  Fall EXAM: RIGHT HUMERUS - 2+ VIEW COMPARISON:  None Available. FINDINGS: AC joint is intact. Acute fracture involving the humeral neck with displaced greater tuberosity fracture fragment. No humeral head dislocation IMPRESSION: Acute humeral neck fracture with displaced greater tuberosity fracture fragment. Electronically Signed   By: Jasmine Pang M.D.   On: 01/14/2023 20:01             LOS: 2 days      Sunnie Nielsen, DO [facility] 01/16/2023, 4:03 PM    Dictation  software may have been used to generate the above note. Typos may occur and escape review in typed/dictated notes. Please contact Dr Lyn Hollingshead directly for clarity if needed.  Staff may message me via secure chat in Epic  but this may not receive an immediate response,  please page me for urgent matters!  If 7PM-7AM, please contact night coverage [email or web address]

## 2023-01-16 NOTE — Plan of Care (Signed)
  Problem: Education: Goal: Knowledge of General Education information will improve Description Including pain rating scale, medication(s)/side effects and non-pharmacologic comfort measures Outcome: Progressing   Problem: Clinical Measurements: Goal: Ability to maintain clinical measurements within normal limits will improve Outcome: Progressing   Problem: Activity: Goal: Risk for activity intolerance will decrease Outcome: Progressing   Problem: Nutrition: Goal: Adequate nutrition will be maintained Outcome: Progressing   Problem: Coping: Goal: Level of anxiety will decrease Outcome: Progressing   Problem: Pain Managment: Goal: General experience of comfort will improve Outcome: Progressing   Problem: Safety: Goal: Ability to remain free from injury will improve Outcome: Progressing   

## 2023-01-16 NOTE — Plan of Care (Signed)
  Problem: Education: Goal: Knowledge of General Education information will improve Description: Including pain rating scale, medication(s)/side effects and non-pharmacologic comfort measures 01/16/2023 1947 by Francoise Ceo, LPN Outcome: Progressing 01/16/2023 0549 by Francoise Ceo, LPN Outcome: Progressing   Problem: Health Behavior/Discharge Planning: Goal: Ability to manage health-related needs will improve 01/16/2023 1947 by Francoise Ceo, LPN Outcome: Progressing 01/16/2023 0549 by Francoise Ceo, LPN Outcome: Progressing   Problem: Clinical Measurements: Goal: Ability to maintain clinical measurements within normal limits will improve 01/16/2023 1947 by Francoise Ceo, LPN Outcome: Progressing 01/16/2023 0549 by Francoise Ceo, LPN Outcome: Progressing Goal: Will remain free from infection 01/16/2023 1947 by Francoise Ceo, LPN Outcome: Progressing 01/16/2023 0549 by Francoise Ceo, LPN Outcome: Progressing Goal: Diagnostic test results will improve 01/16/2023 1947 by Francoise Ceo, LPN Outcome: Progressing 01/16/2023 0549 by Francoise Ceo, LPN Outcome: Progressing Goal: Respiratory complications will improve 01/16/2023 1947 by Francoise Ceo, LPN Outcome: Progressing 01/16/2023 0549 by Francoise Ceo, LPN Outcome: Progressing Goal: Cardiovascular complication will be avoided 01/16/2023 1947 by Francoise Ceo, LPN Outcome: Progressing 01/16/2023 0549 by Francoise Ceo, LPN Outcome: Progressing   Problem: Activity: Goal: Risk for activity intolerance will decrease 01/16/2023 1947 by Francoise Ceo, LPN Outcome: Progressing 01/16/2023 0549 by Francoise Ceo, LPN Outcome: Progressing   Problem: Nutrition: Goal: Adequate nutrition will be maintained 01/16/2023 1947 by Francoise Ceo, LPN Outcome: Progressing 01/16/2023 0549 by Francoise Ceo, LPN Outcome: Progressing   Problem: Coping: Goal: Level  of anxiety will decrease 01/16/2023 1947 by Francoise Ceo, LPN Outcome: Progressing 01/16/2023 0549 by Francoise Ceo, LPN Outcome: Progressing   Problem: Elimination: Goal: Will not experience complications related to bowel motility 01/16/2023 1947 by Francoise Ceo, LPN Outcome: Progressing 01/16/2023 0549 by Francoise Ceo, LPN Outcome: Progressing Goal: Will not experience complications related to urinary retention 01/16/2023 1947 by Francoise Ceo, LPN Outcome: Progressing 01/16/2023 0549 by Francoise Ceo, LPN Outcome: Progressing   Problem: Pain Managment: Goal: General experience of comfort will improve 01/16/2023 1947 by Francoise Ceo, LPN Outcome: Progressing 01/16/2023 0549 by Francoise Ceo, LPN Outcome: Progressing   Problem: Safety: Goal: Ability to remain free from injury will improve 01/16/2023 1947 by Francoise Ceo, LPN Outcome: Progressing 01/16/2023 0549 by Francoise Ceo, LPN Outcome: Progressing   Problem: Skin Integrity: Goal: Risk for impaired skin integrity will decrease 01/16/2023 1947 by Francoise Ceo, LPN Outcome: Progressing 01/16/2023 0549 by Francoise Ceo, LPN Outcome: Progressing

## 2023-01-16 NOTE — Hospital Course (Addendum)
Becky Ward is a 67 y.o. female with medical history significant for 67 year old female with a history of hypertension, alcohol use disorder and nicotine dependence who presents to the ED via EMS following a fall onto her right side while walking her dogs, after her dogs ran into another dog.  She landed on her right shoulder with immediate pain of the right shoulder and twisted her ankle and has been unable to walk since the incident.  She hit her head but denies loss of consciousness.  07/22: To ED and admitted to hospitalist service for treatment closed right trimalleolar fracture and right proximal humerus fracture. 07/23: Podiatry and orthopedics evaluated the patient.  Podiatry planning for surgery 07/24: Surgical intervention pending 07/25: ORIF this afternoon 07/26: awaiting placement for SNF  Consultants:  Orthopedics Podiatry  Procedures: 01/17/23 ORIF and application of external fixator for R trimalleolar ankle fracture      ASSESSMENT & PLAN:   Principal Problem:   Closed right trimalleolar fracture, initial encounter Active Problems:   Closed fracture of right proximal humerus   Alcohol use disorder   Alcohol intoxication (HCC)   Hypokalemia   Anemia, mild   Benign essential HTN   Malnutrition of moderate degree   Closed right trimalleolar fracture, initial encounter S/p fracture reduction in the ED POD1 s/p ORIF and external fixation application  Pain control  PT/OT as able SNF rehab pending placement    Closed fracture of right proximal humerus Orthopedics, Dr. Signa Kell consulted Maintain sling as recommended from the ED Not planning any surgical intervention  continue NWB and pain control  Alcohol use disorder Alcohol intoxication - resolved  EtOH level was 214 on admission  CIWA withdrawal protocol Folic acid, thiamine  Hypokalemia Replace as needed Monitor BMP   Anemia, mild Hemoglobin 10.8 near baseline of 11.1 Monitor periodic CBC    Benign essential HTN Normotensive to low BP Hold antihypertensives  Malnutrition, moderate Dietary following Electrolytes monitored closely   DVT prophylaxis: SCD Pertinent IV fluids/nutrition: No continuous IV fluids Central lines / invasive devices: Foley catheter in place d/t immobility,  Code Status: Full code ACP documentation reviewed: Reviewed 01/16/2023, none on file  Current Admission Status: Inpatient TOC needs / Dispo plan: SNF rehab Barriers to discharge / significant pending items: placement

## 2023-01-17 ENCOUNTER — Other Ambulatory Visit: Payer: Self-pay

## 2023-01-17 ENCOUNTER — Inpatient Hospital Stay: Payer: Medicare Other | Admitting: Anesthesiology

## 2023-01-17 ENCOUNTER — Inpatient Hospital Stay: Payer: Medicare Other

## 2023-01-17 ENCOUNTER — Encounter: Payer: Self-pay | Admitting: Internal Medicine

## 2023-01-17 ENCOUNTER — Encounter
Admission: EM | Disposition: A | Discharge status: Home Health | Payer: Self-pay | Source: Home / Self Care | Attending: Osteopathic Medicine

## 2023-01-17 DIAGNOSIS — S82851A Displaced trimalleolar fracture of right lower leg, initial encounter for closed fracture: Secondary | ICD-10-CM

## 2023-01-17 DIAGNOSIS — S42201A Unspecified fracture of upper end of right humerus, initial encounter for closed fracture: Secondary | ICD-10-CM

## 2023-01-17 HISTORY — DX: Displaced trimalleolar fracture of right lower leg, initial encounter for closed fracture: S82.851A

## 2023-01-17 HISTORY — DX: Unspecified fracture of upper end of right humerus, initial encounter for closed fracture: S42.201A

## 2023-01-17 HISTORY — PX: ORIF ANKLE FRACTURE: SHX5408

## 2023-01-17 LAB — POTASSIUM: Potassium: 3.7 mmol/L (ref 3.5–5.1)

## 2023-01-17 LAB — MAGNESIUM: Magnesium: 2.1 mg/dL (ref 1.7–2.4)

## 2023-01-17 LAB — PHOSPHORUS: Phosphorus: 4.1 mg/dL (ref 2.5–4.6)

## 2023-01-17 SURGERY — OPEN REDUCTION INTERNAL FIXATION (ORIF) ANKLE FRACTURE
Anesthesia: Regional | Site: Ankle | Laterality: Right

## 2023-01-17 MED ORDER — FENTANYL CITRATE (PF) 100 MCG/2ML IJ SOLN: 25.0000 ug | INTRAMUSCULAR | Status: DC | PRN

## 2023-01-17 MED ORDER — MIDAZOLAM HCL 2 MG/2ML IJ SOLN
2.0000 mg | INTRAMUSCULAR | Status: AC | PRN
  Administered 2023-01-17 (×2): 2 mg via INTRAVENOUS

## 2023-01-17 MED ORDER — FENTANYL CITRATE PF 50 MCG/ML IJ SOSY
PREFILLED_SYRINGE | INTRAMUSCULAR | Status: AC
  Filled 2023-01-17: qty 1

## 2023-01-17 MED ORDER — CEFAZOLIN SODIUM-DEXTROSE 2-3 GM-%(50ML) IV SOLR
INTRAVENOUS | Status: DC | PRN
  Administered 2023-01-17: 2 g via INTRAVENOUS

## 2023-01-17 MED ORDER — PHENYLEPHRINE 80 MCG/ML (10ML) SYRINGE FOR IV PUSH (FOR BLOOD PRESSURE SUPPORT)
PREFILLED_SYRINGE | INTRAVENOUS | Status: AC
  Filled 2023-01-17: qty 10

## 2023-01-17 MED ORDER — LACTATED RINGERS IV SOLN: INTRAVENOUS | Status: DC

## 2023-01-17 MED ORDER — 0.9 % SODIUM CHLORIDE (POUR BTL) OPTIME
TOPICAL | Status: DC | PRN
  Administered 2023-01-17: 500 mL

## 2023-01-17 MED ORDER — PHENYLEPHRINE HCL (PRESSORS) 10 MG/ML IV SOLN
INTRAVENOUS | Status: DC | PRN
  Administered 2023-01-17: 80 ug via INTRAVENOUS

## 2023-01-17 MED ORDER — DEXMEDETOMIDINE HCL IN NACL 80 MCG/20ML IV SOLN
INTRAVENOUS | Status: DC | PRN
  Administered 2023-01-17: 4 ug via INTRAVENOUS
  Administered 2023-01-17: 8 ug via INTRAVENOUS

## 2023-01-17 MED ORDER — BUPIVACAINE LIPOSOME 1.3 % IJ SUSP
INTRAMUSCULAR | Status: DC | PRN
  Administered 2023-01-17: 10 mL

## 2023-01-17 MED ORDER — MIDAZOLAM HCL 2 MG/2ML IJ SOLN
INTRAMUSCULAR | Status: AC
  Filled 2023-01-17: qty 2

## 2023-01-17 MED ORDER — OXYCODONE HCL 5 MG/5ML PO SOLN: 5.0000 mg | Freq: Once | ORAL | Status: DC | PRN

## 2023-01-17 MED ORDER — FENTANYL CITRATE PF 50 MCG/ML IJ SOSY
50.0000 ug | PREFILLED_SYRINGE | Freq: Once | INTRAMUSCULAR | Status: AC
  Administered 2023-01-17: 50 ug via INTRAVENOUS

## 2023-01-17 MED ORDER — BUPIVACAINE HCL (PF) 0.5 % IJ SOLN
INTRAMUSCULAR | Status: AC
  Filled 2023-01-17: qty 10

## 2023-01-17 MED ORDER — LACTATED RINGERS IV SOLN: INTRAVENOUS | Status: DC | PRN

## 2023-01-17 MED ORDER — BUPIVACAINE HCL (PF) 0.5 % IJ SOLN
INTRAMUSCULAR | Status: DC | PRN
  Administered 2023-01-17: 10 mL

## 2023-01-17 MED ORDER — DEXAMETHASONE SODIUM PHOSPHATE 10 MG/ML IJ SOLN
INTRAMUSCULAR | Status: DC | PRN
  Administered 2023-01-17: 10 mg via INTRAVENOUS

## 2023-01-17 MED ORDER — BUPIVACAINE LIPOSOME 1.3 % IJ SUSP
INTRAMUSCULAR | Status: AC
  Filled 2023-01-17: qty 10

## 2023-01-17 MED ORDER — SODIUM CHLORIDE (PF) 0.9 % IJ SOLN
INTRAMUSCULAR | Status: AC
  Filled 2023-01-17: qty 10

## 2023-01-17 MED ORDER — ONDANSETRON HCL 4 MG/2ML IJ SOLN
INTRAMUSCULAR | Status: DC | PRN
Start: 2023-01-17 — End: 2023-01-17
  Administered 2023-01-17: 4 mg via INTRAVENOUS

## 2023-01-17 MED ORDER — OXYCODONE HCL 5 MG PO TABS: 5.0000 mg | ORAL_TABLET | Freq: Once | ORAL | Status: DC | PRN

## 2023-01-17 MED ORDER — STERILE WATER FOR IRRIGATION IR SOLN
Status: DC | PRN
  Administered 2023-01-17: 500 mL

## 2023-01-17 MED ORDER — PROPOFOL 1000 MG/100ML IV EMUL
INTRAVENOUS | Status: AC
  Filled 2023-01-17: qty 100

## 2023-01-17 MED ORDER — ONDANSETRON HCL 4 MG/2ML IJ SOLN: 4.0000 mg | Freq: Once | INTRAMUSCULAR | Status: DC | PRN

## 2023-01-17 MED ORDER — KETAMINE HCL 10 MG/ML IJ SOLN
INTRAMUSCULAR | Status: DC | PRN
  Administered 2023-01-17: 20 mg via INTRAVENOUS

## 2023-01-17 MED ORDER — PROPOFOL 10 MG/ML IV BOLUS
INTRAVENOUS | Status: DC | PRN
Start: 2023-01-17 — End: 2023-01-17
  Administered 2023-01-17: 100 mg via INTRAVENOUS

## 2023-01-17 MED ORDER — KETAMINE HCL 50 MG/5ML IJ SOSY
PREFILLED_SYRINGE | INTRAMUSCULAR | Status: AC
  Filled 2023-01-17: qty 5

## 2023-01-17 MED ORDER — ACETAMINOPHEN 10 MG/ML IV SOLN: 1000.0000 mg | Freq: Once | INTRAVENOUS | Status: DC | PRN

## 2023-01-17 MED ORDER — LIDOCAINE HCL (CARDIAC) PF 100 MG/5ML IV SOSY
PREFILLED_SYRINGE | INTRAVENOUS | Status: DC | PRN
  Administered 2023-01-17: 100 mg via INTRAVENOUS

## 2023-01-17 SURGICAL SUPPLY — 64 items
APL PRP STRL LF DISP 70% ISPRP (MISCELLANEOUS) ×2
BIT DRILL 2.0X130 SLD AO (BIT) IMPLANT
BNDG CMPR 5X4 CHSV STRCH STRL (GAUZE/BANDAGES/DRESSINGS) ×2
BNDG CMPR STD VLCR NS LF 5.8X4 (GAUZE/BANDAGES/DRESSINGS) ×2
BNDG COHESIVE 4X5 TAN STRL LF (GAUZE/BANDAGES/DRESSINGS) ×2 IMPLANT
BNDG ELASTIC 4X5.8 VLCR NS LF (GAUZE/BANDAGES/DRESSINGS) ×2 IMPLANT
BNDG ESMARCH 4 X 12 STRL LF (GAUZE/BANDAGES/DRESSINGS) ×2
BNDG ESMARCH 4X12 STRL LF (GAUZE/BANDAGES/DRESSINGS) ×2 IMPLANT
BNDG GAUZE DERMACEA FLUFF 4 (GAUZE/BANDAGES/DRESSINGS) ×2 IMPLANT
BNDG GZE DERMACEA 4 6PLY (GAUZE/BANDAGES/DRESSINGS) ×2
CHLORAPREP W/TINT 26 (MISCELLANEOUS) ×2 IMPLANT
CUFF TOURN SGL QUICK 18X4 (TOURNIQUET CUFF) IMPLANT
DRAPE C-ARM 42X70 (DRAPES) ×2 IMPLANT
DRAPE C-ARMOR (DRAPES) ×2 IMPLANT
ELECT REM PT RETURN 9FT ADLT (ELECTROSURGICAL) ×2
ELECTRODE REM PT RTRN 9FT ADLT (ELECTROSURGICAL) ×2 IMPLANT
GAUZE SPONGE 4X4 12PLY STRL (GAUZE/BANDAGES/DRESSINGS) ×2 IMPLANT
GAUZE XEROFORM 1X8 LF (GAUZE/BANDAGES/DRESSINGS) ×2 IMPLANT
GLOVE BIO SURGEON STRL SZ8 (GLOVE) ×2 IMPLANT
GLOVE BIOGEL PI IND STRL 7.5 (GLOVE) ×2 IMPLANT
GLOVE BIOGEL PI IND STRL 8 (GLOVE) ×2 IMPLANT
GLOVE SS BIOGEL STRL SZ 7.5 (GLOVE) ×4 IMPLANT
GOWN STRL REUS W/ TWL LRG LVL3 (GOWN DISPOSABLE) ×2 IMPLANT
GOWN STRL REUS W/ TWL XL LVL3 (GOWN DISPOSABLE) ×4 IMPLANT
GOWN STRL REUS W/TWL LRG LVL3 (GOWN DISPOSABLE) ×2
GOWN STRL REUS W/TWL XL LVL3 (GOWN DISPOSABLE) ×6
HANDLE YANKAUER SUCT BULB TIP (MISCELLANEOUS) ×2 IMPLANT
KIT INSTR EXT FIX ANKLE (KITS) IMPLANT
KIT TURNOVER KIT A (KITS) ×2 IMPLANT
LABEL OR SOLS (LABEL) ×2 IMPLANT
MANIFOLD NEPTUNE II (INSTRUMENTS) ×2 IMPLANT
NDL HYPO 22X1.5 SAFETY MO (MISCELLANEOUS) ×2 IMPLANT
NEEDLE HYPO 22X1.5 SAFETY MO (MISCELLANEOUS) ×2 IMPLANT
NS IRRIG 500ML POUR BTL (IV SOLUTION) ×2 IMPLANT
PACK EXTREMITY ARMC (MISCELLANEOUS) ×2 IMPLANT
PAD ABD DERMACEA PRESS 5X9 (GAUZE/BANDAGES/DRESSINGS) ×2 IMPLANT
PAD PREP OB/GYN DISP 24X41 (PERSONAL CARE ITEMS) ×2 IMPLANT
PLATE FIBULA 9HOLE ANATOMICAL (Plate) IMPLANT
PLATE LAT HOOK 6H LT (Plate) IMPLANT
SCREW LOCK PLATE R3 2.7X10 (Screw) IMPLANT
SCREW LOCK PLATE R3 2.7X12 (Screw) IMPLANT
SCREW LOCK PLATE R3 2.7X13 (Screw) IMPLANT
SCREW LOCK PLATE R3 2.7X14 (Screw) IMPLANT
SCREW LOCK PLATE R3 2.7X20 (Screw) IMPLANT
SCREW LOCK PLATE R3 2.7X9 (Screw) ×4 IMPLANT
SCREW LOCK PLT 9X2.7X R3CON (Screw) IMPLANT
SCREW NON LOCKING 2.7X20 (Screw) IMPLANT
SCREW NONLOCK PL R3 2.7X26 (Screw) IMPLANT
SPONGE T-LAP 18X18 ~~LOC~~+RFID (SPONGE) ×2 IMPLANT
STAPLER SKIN PROX 35W (STAPLE) IMPLANT
STIMULATOR BONE GROWTH EMG EXT (ORTHOPEDIC SUPPLIES) IMPLANT
STOCKINETTE M/LG 89821 (MISCELLANEOUS) ×2 IMPLANT
STRAP SAFETY 5IN WIDE (MISCELLANEOUS) ×2 IMPLANT
SUT MNCRL AB 3-0 PS2 27 (SUTURE) ×2 IMPLANT
SUT MNCRL AB 4-0 PS2 18 (SUTURE) ×2 IMPLANT
SUT MON AB 5-0 P3 18 (SUTURE) ×2 IMPLANT
SUT VIC AB 3-0 PS2 18 (SUTURE) ×4
SUT VIC AB 3-0 PS2 18XBRD (SUTURE) IMPLANT
SUT VIC AB 4-0 PS2 18 (SUTURE) IMPLANT
SYR 10ML LL (SYRINGE) ×2 IMPLANT
TRAP FLUID SMOKE EVACUATOR (MISCELLANEOUS) ×2 IMPLANT
US CALIBER ANKLE COMPLETE KIT IMPLANT
WATER STERILE IRR 500ML POUR (IV SOLUTION) ×2 IMPLANT
WIRE OLIVE SMOOTH 1.4MMX60MM (WIRE) IMPLANT

## 2023-01-17 NOTE — Op Note (Signed)
OPERATIVE REPORT Patient name: Becky Ward MRN: [id number] DOB: 1955/08/04  DOS: 01/17/23  Preop Dx: Trimalleolar ankle fracture right Postop Dx: same  Procedure:  1.  Open reduction internal fixation right ankle 2.  Application of external fixator right  Surgeon: [name] DPM  Anesthesia: General anesthesia  Hemostasis: Thigh tourniquet inflated to a pressure of after esmarch exsanguination   EBL: 50 mL Materials: Paragon 28 fibular plate with interfrag screw. Paragon 28 medial malleolar hook plate with respective screws. Orthofix Excalibur Monorail external fixator Orthofix external fixator  Injectables: None Pathology: None  Condition: The patient tolerated the procedure and anesthesia well. No complications noted or reported   Justification for procedure: The patient is a 67 y.o. female who presents today for surgical correction of comminuted trimalleolar ankle fracture right. The patient was told benefits as well as possible side effects of the surgery. The patient consented for surgical correction. The patient consent form was reviewed. All patient questions were answered. No guarantees were expressed or implied. The patient and the surgeon both signed the patient consent form with the witness present and placed in the patient's chart.   Procedure in Detail: The patient was brought to the operating room, placed in the operating table in the supine position at which time an aseptic scrub and drape were performed about the patient's respective lower extremity after anesthesia was induced as described above. Attention was then directed to the surgical area where procedure number one commenced.  Procedure #1: Open reduction internal fixation right ankle A 10 cm linear longitudinal skin incision was planned and made bisecting the distal fibula of the right ankle.  Incision was carefully dissected down to the level of bone and fracture with care taken to cut  clamp ligate and retract away all small neurovascular structures traversing the incision site.  The fibular fracture was identified and reduced with bone reduction forceps which was verified by intraoperative x-ray fluoroscopy.  A 2.7 mm Paragon 28 interfrag screw was then placed across the fracture site followed by Paragon 28 anatomical fibular plate with the respective screws.  Placement of the hardware was verified with intraoperative x-ray fluoroscopy and all hardware inserted in the standard AO fashion.  The bone reduction forceps were removed and attention was then directed to the medial aspect of the right ankle A second 8 cm linear longitudinal skin incision was planned and made overlying the medial malleolus.  Incision carried down to the level of bone and fracture fragments with care taken to retract away all small neurovascular structures traversing the incision site as well as identification of the saphenous vein which was retracted and preserved.  The fracture fragments of the medial malleolus and posterior malleolus were somewhat reduced after reduction and fixation of the fibula.  A medial malleoli are post plate was then inserted with the respective screws and verified by x-ray fluoroscopy which stabilized the fracture fragments of the medial malleolus.  Again inserted in the standard fashion. Final intraoperative x-ray fluoroscopy was taken which demonstrated restoration of the tibiotalar joint and reduction of the ankle fracture fragments. Copious irrigation was then utilized and routine layered primary closure was achieved using 3-0 Vicryl, 4-0 Vicryl, and stainless steel skin staples.  Procedure #2: Application of External Fixator right ankle After internal reduction, due to the significant comminution and patient's past medical history and concern for nonweightbearing for 6-8 weeks, the decision was made to apply external fixation to provide additional stabilization to the ankle joint.  Orthofix Excalibur monorail was utilized to provide external fixation to the ankle fracture.  A guide sleeve and temporary K wire fixation was utilized for placement of the Schanz pins to the neck of the talus as well as the calcaneal tubercle.  The Schanz pins were inserted in the standard fashion and verified with x-ray fluoroscopy.  After appropriate insertion of the distal Schanz pins into the neck of the talus and calcaneus, the proximal Schanz pins were inserted into the medial facet of the tibia verified by x-ray fluoroscopy.  The external monorail fixator was applied overlying the Schanz pins and placed in the appropriate position and tightened and secured.  Dry sterile compressive dressings were then applied to all previously mentioned incision sites about the patient's lower extremity. The tourniquet which was used for hemostasis was deflated. All normal neurovascular responses including pink color and warmth returned all the digits of patient's lower extremity.  Prior to transfer to the recovery room, and Orthofix external bone stimulator was applied to the right ankle to promote healing and reduce the chances of nonunion.  The patient was then transferred from the operating room to the recovery room having tolerated the procedure and anesthesia well. All vital signs are stable. After a brief stay in the recovery room the patient was readmitted to inpatient room with postoperative orders placed.    IMPRESSION: Good reduction of the trimalleolar ankle fracture with adequate stabilization with internal and external fixation of  [name], DPM [facility]  Dr. [name], DPM    [address] [place], [place] [address]                Office [phone]  Fax [phone]

## 2023-01-17 NOTE — Anesthesia Procedure Notes (Signed)
Anesthesia Regional Block: Adductor canal block   Pre-Anesthetic Checklist: , timeout performed,  Correct Patient, Correct Site, Correct Laterality,  Correct Procedure,, risks and benefits discussed,  Surgical consent,  Pre-op evaluation,  At surgeon's request and post-op pain management  Laterality: Right  Prep: chloraprep       Needles:  Injection technique: Single-shot  Needle Type: Echogenic Needle          Additional Needles:   Procedures:,,,, ultrasound used (permanent image in chart),,   Motor weakness within 20 minutes.  Narrative:  Start time: 01/17/2023 3:13 PM End time: 01/17/2023 3:14 PM Injection made incrementally with aspirations every 5 mL.  Performed by: Personally  Anesthesiologist: Reed Breech, MD  Additional Notes: Functioning IV was confirmed and monitors applied.  Sterile prep and drape, hand hygiene and sterile gloves were used. Ultrasound guidance: relevant anatomy identified, needle position confirmed, local anesthetic spread visualized around nerve(s), vascular puncture avoided.  Image saved to electronic medical record.  Negative aspiration prior to incremental administration of local anesthetic for total 5 ml Exparel and 5 ml bupivacaine 0.5% (diluted in 5 ml NS) given in interscalene distribution. The patient tolerated the procedure well. Vital signs and moderate sedation medications recorded in RN notes.

## 2023-01-17 NOTE — Transfer of Care (Signed)
Immediate Anesthesia Transfer of Care Note  Patient: Becky Ward  Procedure(s) Performed: OPEN REDUCTION INTERNAL FIXATION (ORIF) ANKLE FRACTURE (Right: Ankle) External Fixation Ankle (Right)  Patient Location: PACU  Anesthesia Type:General and Regional  Level of Consciousness: drowsy and patient cooperative  Airway & Oxygen Therapy: Patient Spontanous Breathing and Patient connected to face mask oxygen  Post-op Assessment: Report given to RN and Post -op Vital signs reviewed and stable  Post vital signs: Reviewed and stable  Last Vitals:  Vitals Value Taken Time  BP 107/70   Temp    Pulse 84 01/17/23 1830  Resp 16 01/17/23 1830  SpO2 93 % 01/17/23 1830  Vitals shown include unfiled device data.  Last Pain:  Vitals:   01/17/23 1447  TempSrc: Temporal  PainSc: 0-No pain      Patients Stated Pain Goal: 2 (01/17/23 1150)  Complications: No notable events documented.

## 2023-01-17 NOTE — Anesthesia Preprocedure Evaluation (Addendum)
Anesthesia Evaluation  Patient identified by MRN, date of birth, ID band Patient awake    Reviewed: Allergy & Precautions, NPO status , Patient's Chart, lab work & pertinent test results  History of Anesthesia Complications Negative for: history of anesthetic complications  Airway Mallampati: IV   Neck ROM: Full    Dental  (+) Missing, Chipped, Poor Dentition   Pulmonary Current Smoker (1 ppd) and Patient abstained from smoking.   Pulmonary exam normal breath sounds clear to auscultation       Cardiovascular hypertension, Normal cardiovascular exam Rhythm:Regular Rate:Normal  ECG 01/14/23: Sinus rhythm Nonspecific intraventricular conduction delay ST elevation, consider inferior injury   Neuro/Psych Alcohol use disorder, 3 beers per day; last intake 4 days ago    GI/Hepatic negative GI ROS,,,  Endo/Other  negative endocrine ROS    Renal/GU negative Renal ROS     Musculoskeletal  (+) Arthritis , Rheumatoid disorders,    Abdominal   Peds  Hematology  (+) Blood dyscrasia, anemia   Anesthesia Other Findings   Reproductive/Obstetrics                             Anesthesia Physical Anesthesia Plan  ASA: 3  Anesthesia Plan: General and Regional   Post-op Pain Management: Regional block*   Induction: Intravenous  PONV Risk Score and Plan: 2 and Ondansetron, Dexamethasone and Treatment may vary due to age or medical condition  Airway Management Planned: LMA  Additional Equipment:   Intra-op Plan:   Post-operative Plan: Extubation in OR  Informed Consent: I have reviewed the patients History and Physical, chart, labs and discussed the procedure including the risks, benefits and alternatives for the proposed anesthesia with the patient or authorized representative who has indicated his/her understanding and acceptance.     Dental advisory given  Plan Discussed with:  CRNA  Anesthesia Plan Comments: (Consented for preoperative adductor saphenous and popliteal sciatic nerve blocks if requested by surgeon.    Patient consented for risks of anesthesia including but not limited to:  - adverse reactions to medications - damage to eyes, teeth, lips or other oral mucosa - nerve damage due to positioning  - sore throat or hoarseness - damage to heart, brain, nerves, lungs, other parts of body or loss of life  Informed patient about role of CRNA in peri- and intra-operative care.  Patient voiced understanding.)        Anesthesia Quick Evaluation

## 2023-01-17 NOTE — Care Management Important Message (Signed)
Important Message  Patient Details  Name: Becky Ward MRN: [id number] Date of Birth: 03-Aug-1955   Medicare Important Message Given:  N/A - LOS <3 / Initial given by admissions     [name] 01/17/2023, 9:04 AM

## 2023-01-17 NOTE — Plan of Care (Signed)

## 2023-01-17 NOTE — Anesthesia Procedure Notes (Signed)
Anesthesia Regional Block: Popliteal block   Pre-Anesthetic Checklist: , timeout performed,  Correct Patient, Correct Site, Correct Laterality,  Correct Procedure,, risks and benefits discussed,  Surgical consent,  Pre-op evaluation,  At surgeon's request and post-op pain management  Laterality: Right  Prep: chloraprep       Needles:  Injection technique: Single-shot  Needle Type: Echogenic Needle          Additional Needles:   Procedures:,,,, ultrasound used (permanent image in chart),,   Motor weakness within 20 minutes.  Narrative:  Start time: 01/17/2023 3:09 PM End time: 01/17/2023 3:12 PM Injection made incrementally with aspirations every 5 mL.  Performed by: Personally  Anesthesiologist: Reed Breech, MD  Additional Notes: Functioning IV was confirmed and monitors applied.  Sterile prep and drape, hand hygiene and sterile gloves were used. Ultrasound guidance: relevant anatomy identified, needle position confirmed, local anesthetic spread visualized around nerve(s), vascular puncture avoided.  Image saved to electronic medical record.  Negative aspiration prior to incremental administration of local anesthetic for total 5 ml Exparel and 5 ml bupivacaine 0.5% (diluted in 5 ml NS) given in popliteal sciatic distribution. The patient tolerated the procedure well. Vital signs and moderate sedation medications recorded in RN notes.

## 2023-01-17 NOTE — Anesthesia Postprocedure Evaluation (Signed)
Anesthesia Post Note  Patient: Deshawna Mcneece  Procedure(s) Performed: OPEN REDUCTION INTERNAL FIXATION (ORIF) ANKLE FRACTURE (Right: Ankle) External Fixation Ankle (Right)  Patient location during evaluation: PACU Anesthesia Type: Regional and General Level of consciousness: awake and alert Pain management: pain level controlled Vital Signs Assessment: post-procedure vital signs reviewed and stable Respiratory status: spontaneous breathing, nonlabored ventilation, respiratory function stable and patient connected to nasal cannula oxygen Cardiovascular status: blood pressure returned to baseline and stable Postop Assessment: no apparent nausea or vomiting Anesthetic complications: no   No notable events documented.   Last Vitals:  Vitals:   01/17/23 1835 01/17/23 1845  BP:  115/74  Pulse:  87  Resp:  15  Temp:  36.8 C  SpO2: 100% 92%    Last Pain:  Vitals:   01/17/23 1830  TempSrc:   PainSc: Asleep                 Louie Boston

## 2023-01-17 NOTE — Brief Op Note (Signed)
01/17/2023  6:22 PM  PATIENT:  Becky Ward  67 y.o. female  PRE-OPERATIVE DIAGNOSIS:  Right ankle fracture  POST-OPERATIVE DIAGNOSIS:  Right ankle fracture  PROCEDURE:  Procedure(s): OPEN REDUCTION INTERNAL FIXATION (ORIF) ANKLE FRACTURE (Right) External Fixation Ankle (Right)  SURGEON:  Surgeons and Role:    [name], DPM - Primary  PHYSICIAN ASSISTANT:   ASSISTANTS: none   ANESTHESIA:   general  EBL:  20 mL   BLOOD ADMINISTERED:none  DRAINS: none   LOCAL MEDICATIONS USED:  NONE  SPECIMEN:  No Specimen  DISPOSITION OF SPECIMEN:  N/A  COUNTS:  YES  TOURNIQUET:   Total Tourniquet Time Documented: Thigh (Right) - 120 minutes Total: Thigh (Right) - 120 minutes   DICTATION: .Reubin Milan Dictation  PLAN OF CARE: Admit to inpatient   PATIENT DISPOSITION:  PACU - hemodynamically stable.   Delay start of Pharmacological VTE agent (>24hrs) due to surgical blood loss or risk of bleeding: no  [name], DPM [facility]  Dr. [name], DPM    [address] [place], [place] [address]                Office [phone]  Fax [phone]

## 2023-01-17 NOTE — Interval H&P Note (Signed)
History and Physical Interval Note:  01/17/2023 3:05 PM  Becky Ward  has presented today for surgery, with the diagnosis of Right ankle fracture.  The various methods of treatment have been discussed with the patient and family. After consideration of risks, benefits and other options for treatment, the patient has consented to  Procedure(s): OPEN REDUCTION INTERNAL FIXATION (ORIF) ANKLE FRACTURE (Right) as a surgical intervention.  The patient's history has been reviewed, patient examined, no change in status, stable for surgery.  I have reviewed the patient's chart and labs.  Questions were answered to the patient's satisfaction.     [name]

## 2023-01-17 NOTE — Progress Notes (Addendum)
PROGRESS NOTE    Becky Ward   LOV:[id number] DOB: Oct 08, 1955  DOA: 01/14/2023 Date of Service: 01/17/23 PCP: Margit Hanks, MD     Brief Narrative / Hospital Course:  Becky Ward is a 67 y.o. female with medical history significant for 68 year old female with a history of hypertension, alcohol use disorder and nicotine dependence who presents to the ED via EMS following a fall onto her right side while walking her dogs, after her dogs ran into another dog.  She landed on her right shoulder with immediate pain of the right shoulder and twisted her ankle and has been unable to walk since the incident.  She hit her head but denies loss of consciousness.  07/22: To ED and admitted to hospitalist service for treatment closed right trimalleolar fracture and right proximal humerus fracture. 07/23: Podiatry and orthopedics evaluated the patient.  Podiatry planning for surgery 07/24: Surgical intervention pending 07/25: ORIF this afternoon  Consultants:  Orthopedics Podiatry  Procedures: Rosalie Gums 01/17/23 ORIF R trimalleolar ankle fracture}      ASSESSMENT & PLAN:   Principal Problem:   Closed right trimalleolar fracture, initial encounter Active Problems:   Closed fracture of right proximal humerus   Alcohol use disorder   Alcohol intoxication (HCC)   Hypokalemia   Anemia, mild   Benign essential HTN   Malnutrition of moderate degree   Closed right trimalleolar fracture, initial encounter S/p fracture reduction in the ED Podiatry planning fixation surgery  Pain control in the interim PT/OT to assess following day, anticipate will need SNF rehab    Closed fracture of right proximal humerus Orthopedics, Dr. Signa Kell consulted Maintain sling as recommended from the ED At this point orthopedics is not planning any surgical intervention for right shoulder fracture continue nonweightbearing and pain control    Alcohol use disorder Alcohol intoxication EtOH  level was 214 CIWA withdrawal protocol Folic acid, thiamine  Hypokalemia Replace as needed Monitor BMP   Anemia, mild Hemoglobin 10.8 near baseline of 11.1 Monitor periodic CBC   Benign essential HTN Normotensive to soft Hold antihypertensives  Malnutrition, moderate Dietary following Electrolytes monitored closely   DVT prophylaxis: SCD Pertinent IV fluids/nutrition: No continuous IV fluids Central lines / invasive devices: Foley catheter in place  Code Status: Full code ACP documentation reviewed: Reviewed 01/16/2023, none on file  Current Admission Status: Inpatient TOC needs / Dispo plan: Likely will need SNF placement for rehab, PT/OT to see following surgery  Barriers to discharge / significant pending items: Surgical fixation right ankle and will consult TOC for likely placement             Subjective / Brief ROS:  Patient resting comfortably, no concerns    Family Communication: none at this time    Objective Findings:  Vitals:   01/16/23 1538 01/16/23 2327 01/17/23 0500 01/17/23 0745  BP: 111/74 128/73  130/75  Pulse: 83 75  78  Resp: 16 20  16   Temp: 99 F (37.2 C) 99 F (37.2 C)  99.1 F (37.3 C)  TempSrc:    Oral  SpO2: 100% 95%  98%  Weight:   56.3 kg   Height:        Intake/Output Summary (Last 24 hours) at 01/17/2023 1408 Last data filed at 01/17/2023 0857 Gross per 24 hour  Intake --  Output 650 ml  Net -650 ml   Filed Weights   01/14/23 2335 01/16/23 0500 01/17/23 0500  Weight: 55.1 kg 59.9 kg 56.3 kg    Examination:  Physical Exam Constitutional:      General: She is not in acute distress. Cardiovascular:     Rate and Rhythm: Normal rate.  Pulmonary:     Effort: Pulmonary effort is normal.     Breath sounds: Normal breath sounds.          Scheduled Medications:   feeding supplement  1 Container Oral TID BM   folic acid  1 mg Oral Daily   HYDROcodone-acetaminophen  2 tablet Oral Q6H   mupirocin ointment   1 Application Nasal BID   nicotine  7 mg Transdermal Daily   thiamine  100 mg Oral Daily   Or   thiamine  100 mg Intravenous Daily    Continuous Infusions:   PRN Medications:  LORazepam **OR** LORazepam, morphine injection, senna-docusate  Antimicrobials from admission:  Anti-infectives (From admission, onward)    None           Data Reviewed:  I have personally reviewed the following...  CBC: Recent Labs  Lab 01/14/23 1840 01/15/23 1404 01/16/23 0511  WBC 13.8* 8.4 7.5  NEUTROABS 10.3* 6.1 4.8  HGB 10.8* 9.4* 9.2*  HCT 32.8* 29.3* 27.5*  MCV 98.8 101.0* 99.6  PLT 483* 375 351   Basic Metabolic Panel: Recent Labs  Lab 01/14/23 1840 01/15/23 1404 01/16/23 0511 01/17/23 0638  NA 138 135 135  --   K 3.3* 3.2* 4.4 3.7  CL 106 106 108  --   CO2 19* 23 22  --   GLUCOSE 104* 146* 100*  --   BUN 10 13 11   --   CREATININE 0.74 0.73 0.53  --   CALCIUM 8.2* 7.9* 8.0*  --   MG  --   --  2.3 2.1  PHOS  --   --  2.0* 4.1   GFR: Estimated Creatinine Clearance: 56.4 mL/min (by C-G formula based on SCr of 0.53 mg/dL). Liver Function Tests: Recent Labs  Lab 01/14/23 1840  AST 30  ALT 21  ALKPHOS 56  BILITOT 0.3  PROT 6.7  ALBUMIN 4.1   No results for input(s): "LIPASE", "AMYLASE" in the last 168 hours. No results for input(s): "AMMONIA" in the last 168 hours. Coagulation Profile: Recent Labs  Lab 01/14/23 1856  INR 1.2   Cardiac Enzymes: No results for input(s): "CKTOTAL", "CKMB", "CKMBINDEX", "TROPONINI" in the last 168 hours. BNP (last 3 results) No results for input(s): "PROBNP" in the last 8760 hours. HbA1C: No results for input(s): "HGBA1C" in the last 72 hours. CBG: Recent Labs  Lab 01/14/23 1852  GLUCAP 97   Lipid Profile: No results for input(s): "CHOL", "HDL", "LDLCALC", "TRIG", "CHOLHDL", "LDLDIRECT" in the last 72 hours. Thyroid Function Tests: No results for input(s): "TSH", "T4TOTAL", "FREET4", "T3FREE", "THYROIDAB" in the last  72 hours. Anemia Panel: No results for input(s): "VITAMINB12", "FOLATE", "FERRITIN", "TIBC", "IRON", "RETICCTPCT" in the last 72 hours. Most Recent Urinalysis On File:     Component Value Date/Time   COLORURINE STRAW (A) 08/08/2021 0610   APPEARANCEUR CLEAR (A) 08/08/2021 0610   APPEARANCEUR Clear 01/06/2014 0909   LABSPEC 1.003 (L) 08/08/2021 0610   LABSPEC 1.017 01/06/2014 0909   PHURINE 5.0 08/08/2021 0610   GLUCOSEU NEGATIVE 08/08/2021 0610   GLUCOSEU Negative 01/06/2014 0909   HGBUR NEGATIVE 08/08/2021 0610   BILIRUBINUR NEGATIVE 08/08/2021 0610   BILIRUBINUR Negative 01/06/2014 0909   KETONESUR NEGATIVE 08/08/2021 0610   PROTEINUR NEGATIVE 08/08/2021 0610   NITRITE NEGATIVE 08/08/2021 0610   LEUKOCYTESUR NEGATIVE 08/08/2021 0610  LEUKOCYTESUR 2+ 01/06/2014 0909   Sepsis Labs: @LABRCNTIP (procalcitonin:4,lacticidven:4) Microbiology: Recent Results (from the past 240 hour(s))  Surgical PCR screen     Status: None   Collection Time: 01/15/23  5:19 AM   Specimen: Nasal Mucosa; Nasal Swab  Result Value Ref Range Status   MRSA, PCR NEGATIVE NEGATIVE Final   Staphylococcus aureus NEGATIVE NEGATIVE Final    Comment: (NOTE) The Xpert SA Assay (FDA approved for NASAL specimens in patients 75 years of age and older), is one component of a comprehensive surveillance program. It is not intended to diagnose infection nor to guide or monitor treatment. Performed at [facility], [address] [place], [place] [address]       Radiology Studies last 3 days: CT ANKLE RIGHT WO CONTRAST  Result Date: 01/14/2023 CLINICAL DATA:  Trauma EXAM: CT OF THE RIGHT ANKLE WITHOUT CONTRAST TECHNIQUE: Multidetector CT imaging of the right ankle was performed according to the standard protocol. Multiplanar CT image reconstructions were also generated. RADIATION DOSE REDUCTION: This exam was performed according to the departmental dose-optimization program which includes automated  exposure control, adjustment of the mA and/or kV according to patient size and/or use of iterative reconstruction technique. COMPARISON:  Right ankle x-ray 01/14/2023 FINDINGS: Bones/Joint/Cartilage The bones are osteopenic. There is an acute comminuted medial malleolus fracture. The tip of the medial malleolus is displaced inferiorly 6 mm. There is also an acute distal fibular fracture which is markedly comminuted at the level of the ankle mortise. The tip of the lateral malleolus as well aligned, but the proximal fracture fragment is displaced anteriorly 1 cm in there is 1 cm of overlap. There is anterior dislocation of the anterior talus in relation to the talar dome. There is a mildly comminuted posterior malleolar fracture. The posterior malleolus as well aligned. The posterior aspect of the distal tibia approximate the anterior talus. The talar dome appears intact. Ligaments Suboptimally assessed by CT. Muscles and Tendons There is intramuscular edema and edema surrounding tendons in the fracture sites. No foreign body. Soft tissues There is subcutaneous edema and swelling surrounding the ankle and lateral aspect of the foot. IMPRESSION: 1. Acute comminuted medial malleolus fracture. 2. Acute displaced comminuted distal fibular fracture. 3. Acute mildly comminuted posterior malleolar fracture. 4. Anterior dislocation of the anterior talus in relation to the talar dome. 5. The posterior aspect of the distal tibia approximates the anterior talus. The talar dome appears intact. Electronically Signed   By: Darliss Cheney M.D.   On: 01/14/2023 21:52   DG Ankle 2 Views Right  Result Date: 01/14/2023 CLINICAL DATA:  Postreduction EXAM: RIGHT ANKLE - 2 VIEW COMPARISON:  Radiographs dated January 14, 2023 FINDINGS: Status post reduction and casting redemonstrate trimalleolar fractures. Interval partial improvement of the posterior talar dislocation. IMPRESSION: Status post reduction and casting redemonstrate  trimalleolar fractures. Interval partial improvement of the posterior talar dislocation. Electronically Signed   By: Larose Hires D.O.   On: 01/14/2023 21:15   DG Chest Portable 1 View  Result Date: 01/14/2023 CLINICAL DATA:  Fall EXAM: PORTABLE CHEST 1 VIEW COMPARISON:  08/08/2021 FINDINGS: The heart size and mediastinal contours are within normal limits. Aortic atherosclerosis. Both lungs are clear. Acute proximal right humerus fracture. IMPRESSION: No active disease. Acute proximal right humerus fracture. Electronically Signed   By: Jasmine Pang M.D.   On: 01/14/2023 20:09   DG Ankle Right Port  Result Date: 01/14/2023 CLINICAL DATA:  Fall with ankle pain EXAM: PORTABLE RIGHT ANKLE - 2 VIEW  COMPARISON:  None Available. FINDINGS: Acute mildly comminuted trimalleolar fracture with posterior dislocation of the talus with respect to the distal tibia. Distal fibular and posterior malleolar fracture fragments are displaced. Copious soft tissue swelling. IMPRESSION: Acute trimalleolar fracture with posterior dislocation of the talus. Electronically Signed   By: Jasmine Pang M.D.   On: 01/14/2023 20:08   DG Pelvis Portable  Result Date: 01/14/2023 CLINICAL DATA:  Fall EXAM: PORTABLE PELVIS 1-2 VIEWS COMPARISON:  None Available. FINDINGS: SI joints are non widened. Pubic symphysis and rami appear intact. Mild chronic sclerosis at the humeral heads. No fracture or dislocation IMPRESSION: No acute osseous abnormality Electronically Signed   By: Jasmine Pang M.D.   On: 01/14/2023 20:03   DG Tibia/Fibula Right  Result Date: 01/14/2023 CLINICAL DATA:  Fall EXAM: RIGHT TIBIA AND FIBULA - 2 VIEW COMPARISON:  None Available. FINDINGS: Proximal and mid tibia and fibula appear intact. Acute fracture dislocation at the ankle. Diffuse soft tissue swelling IMPRESSION: Acute fracture dislocation at the ankle. See separately dictated ankle report Electronically Signed   By: Jasmine Pang M.D.   On: 01/14/2023 20:02    CT HEAD WO CONTRAST ( )  Result Date: 01/14/2023 CLINICAL DATA:  Head trauma, moderate-severe; Neck trauma (Age >= 65y) EXAM: CT HEAD WITHOUT CONTRAST CT MAXILLOFACIAL WITHOUT CONTRAST CT CERVICAL SPINE WITHOUT CONTRAST TECHNIQUE: Multidetector CT imaging of the head, cervical spine, and maxillofacial structures were performed using the standard protocol without intravenous contrast. Multiplanar CT image reconstructions of the cervical spine and maxillofacial structures were also generated. RADIATION DOSE REDUCTION: This exam was performed according to the departmental dose-optimization program which includes automated exposure control, adjustment of the mA and/or kV according to patient size and/or use of iterative reconstruction technique. COMPARISON:  CT Head 08/08/21 FINDINGS: CT HEAD FINDINGS Brain: No hemorrhage. No hydrocephalus. No extra-axial fluid collection. Unchanged mineralization of the right thalamus. No CT evidence of an acute cortical infarct. There is sequela of mild chronic microvascular ischemic change. Vascular: No hyperdense vessel or unexpected calcification. Skull: Normal. Negative for fracture or focal lesion. Other: None. CT MAXILLOFACIAL FINDINGS Osseous: No fracture or mandibular dislocation. 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 Orbits: Negative. No traumatic or inflammatory finding. Sinuses: No middle ear or mastoid effusion. Paranasal sinuses are notable for right maxillary antrostomy and osseous findings suggestive of chronic right maxillary sinusitis. Orbits are unremarkable. Soft tissues: Negative. CT CERVICAL SPINE FINDINGS Alignment: Trace anterolisthesis of C2 on C3. Skull base and vertebrae: No acute fracture. No primary bone lesion or focal pathologic process. Soft tissues and spinal canal: No prevertebral fluid or swelling. No visible canal hematoma. Disc levels:  No evidence of high-grade spinal canal stenosis. Upper chest: Negative. Other: None  IMPRESSION: 1. No acute intracranial abnormality. 2. No acute facial bone fracture. 3. No acute fracture or traumatic listhesis of the cervical spine. 4. 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 Electronically Signed   By: Lorenza Cambridge M.D.   On: 01/14/2023 20:02   CT Cervical Spine Wo Contrast  Result Date: 01/14/2023 CLINICAL DATA:  Head trauma, moderate-severe; Neck trauma (Age >= 65y) EXAM: CT HEAD WITHOUT CONTRAST CT MAXILLOFACIAL WITHOUT CONTRAST CT CERVICAL SPINE WITHOUT CONTRAST TECHNIQUE: Multidetector CT imaging of the head, cervical spine, and maxillofacial structures were performed using the standard protocol without intravenous contrast. Multiplanar CT image reconstructions of the cervical spine and maxillofacial structures were also generated. RADIATION DOSE REDUCTION: This exam was performed according to the departmental dose-optimization program which includes automated exposure control,  adjustment of the mA and/or kV according to patient size and/or use of iterative reconstruction technique. COMPARISON:  CT Head 08/08/21 FINDINGS: CT HEAD FINDINGS Brain: No hemorrhage. No hydrocephalus. No extra-axial fluid collection. Unchanged mineralization of the right thalamus. No CT evidence of an acute cortical infarct. There is sequela of mild chronic microvascular ischemic change. Vascular: No hyperdense vessel or unexpected calcification. Skull: Normal. Negative for fracture or focal lesion. Other: None. CT MAXILLOFACIAL FINDINGS Osseous: No fracture or mandibular dislocation. 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 Orbits: Negative. No traumatic or inflammatory finding. Sinuses: No middle ear or mastoid effusion. Paranasal sinuses are notable for right maxillary antrostomy and osseous findings suggestive of chronic right maxillary sinusitis. Orbits are unremarkable. Soft tissues: Negative. CT CERVICAL SPINE FINDINGS Alignment: Trace anterolisthesis of C2 on C3. Skull  base and vertebrae: No acute fracture. No primary bone lesion or focal pathologic process. Soft tissues and spinal canal: No prevertebral fluid or swelling. No visible canal hematoma. Disc levels:  No evidence of high-grade spinal canal stenosis. Upper chest: Negative. Other: None IMPRESSION: 1. No acute intracranial abnormality. 2. No acute facial bone fracture. 3. No acute fracture or traumatic listhesis of the cervical spine. 4. 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 Electronically Signed   By: Lorenza Cambridge M.D.   On: 01/14/2023 20:02   CT MAXILLOFACIAL WO CONTRAST  Result Date: 01/14/2023 CLINICAL DATA:  Head trauma, moderate-severe; Neck trauma (Age >= 65y) EXAM: CT HEAD WITHOUT CONTRAST CT MAXILLOFACIAL WITHOUT CONTRAST CT CERVICAL SPINE WITHOUT CONTRAST TECHNIQUE: Multidetector CT imaging of the head, cervical spine, and maxillofacial structures were performed using the standard protocol without intravenous contrast. Multiplanar CT image reconstructions of the cervical spine and maxillofacial structures were also generated. RADIATION DOSE REDUCTION: This exam was performed according to the departmental dose-optimization program which includes automated exposure control, adjustment of the mA and/or kV according to patient size and/or use of iterative reconstruction technique. COMPARISON:  CT Head 08/08/21 FINDINGS: CT HEAD FINDINGS Brain: No hemorrhage. No hydrocephalus. No extra-axial fluid collection. Unchanged mineralization of the right thalamus. No CT evidence of an acute cortical infarct. There is sequela of mild chronic microvascular ischemic change. Vascular: No hyperdense vessel or unexpected calcification. Skull: Normal. Negative for fracture or focal lesion. Other: None. CT MAXILLOFACIAL FINDINGS Osseous: No fracture or mandibular dislocation. 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 Orbits: Negative. No traumatic or inflammatory finding. Sinuses: No middle ear or  mastoid effusion. Paranasal sinuses are notable for right maxillary antrostomy and osseous findings suggestive of chronic right maxillary sinusitis. Orbits are unremarkable. Soft tissues: Negative. CT CERVICAL SPINE FINDINGS Alignment: Trace anterolisthesis of C2 on C3. Skull base and vertebrae: No acute fracture. No primary bone lesion or focal pathologic process. Soft tissues and spinal canal: No prevertebral fluid or swelling. No visible canal hematoma. Disc levels:  No evidence of high-grade spinal canal stenosis. Upper chest: Negative. Other: None IMPRESSION: 1. No acute intracranial abnormality. 2. No acute facial bone fracture. 3. No acute fracture or traumatic listhesis of the cervical spine. 4. 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 Electronically Signed   By: Lorenza Cambridge M.D.   On: 01/14/2023 20:02   DG Humerus Right  Result Date: 01/14/2023 CLINICAL DATA:  Fall EXAM: RIGHT HUMERUS - 2+ VIEW COMPARISON:  None Available. FINDINGS: AC joint is intact. Acute fracture involving the humeral neck with displaced greater tuberosity fracture fragment. No humeral head dislocation IMPRESSION: Acute humeral neck fracture with displaced greater tuberosity fracture fragment. Electronically  Signed   By: Jasmine Pang M.D.   On: 01/14/2023 20:01             LOS: 3 days      Sunnie Nielsen, DO [facility] 01/17/2023, 2:08 PM    Dictation software may have been used to generate the above note. Typos may occur and escape review in typed/dictated notes. Please contact Dr Lyn Hollingshead directly for clarity if needed.  Staff may message me via secure chat in Epic  but this may not receive an immediate response,  please page me for urgent matters!  If 7PM-7AM, please contact night coverage [email or web address]

## 2023-01-18 DIAGNOSIS — S82851A Displaced trimalleolar fracture of right lower leg, initial encounter for closed fracture: Secondary | ICD-10-CM | POA: Diagnosis not present

## 2023-01-18 LAB — CBC: nRBC: 0 % (ref 0.0–0.2)

## 2023-01-18 MED ORDER — CHLORHEXIDINE GLUCONATE CLOTH 2 % EX PADS
6.0000 | MEDICATED_PAD | Freq: Every day | CUTANEOUS | Status: DC
  Administered 2023-01-18 – 2023-01-20 (×3): 6 via TOPICAL

## 2023-01-18 MED ORDER — OXYCODONE-ACETAMINOPHEN 5-325 MG PO TABS
1.0000 | ORAL_TABLET | ORAL | Status: DC | PRN
  Administered 2023-01-18 – 2023-01-20 (×8): 1 via ORAL
  Filled 2023-01-18 (×10): qty 1

## 2023-01-18 NOTE — Evaluation (Signed)
Physical Therapy Evaluation Patient Details Name: Becky Ward MRN: [id number] DOB: October 04, 1955 Today's Date: 01/18/2023  History of Present Illness  Pt is a 67 y/o F admitted on 01/14/23 after presenting with c/o a fall onto her R side. Pt found to have R trimalleolar fx & R proximal humerus fx. Pt is s/p R ankle ORIF, external fixation on 01/17/23. R shoulder being managed nonoperatively in a sling. PMH: HTN, alcohol use disorder, nicotine dependence  Clinical Impression  Pt seen for PT evaluation with co-tx with OT. Pt reports prior to admission she was independent without AD, driving, caring for her 3 dogs. On this date, PT/OT educated pt on NWB RUE & RLE. Pt is able to complete bed mobility with use of hospital bed features with CGA, bed>drop arm recliner with min assist +2 with extra time & pt pain limited. Despite pt being in significant pain she is motivated to participate & eager to get better. Will continue to follow pt acutely to progress mobility as able.        Assistance Recommended at Discharge Frequent or constant Supervision/Assistance  If plan is discharge home, recommend the following:  Can travel by private vehicle  Two people to help with walking and/or transfers;Two people to help with bathing/dressing/bathroom;Help with stairs or ramp for entrance;Assist for transportation;Assistance with feeding;Assistance with cooking/housework   No    Equipment Recommendations Wheelchair cushion (measurements PT) (w/c (possibly L one arm drive w/c), drop arm BSC)  Recommendations for Other Services       Functional Status Assessment Patient has had a recent decline in their functional status and demonstrates the ability to make significant improvements in function in a reasonable and predictable amount of time.     Precautions / Restrictions Precautions Precautions: Fall Required Braces or Orthoses: Sling (RUE) Restrictions Weight Bearing Restrictions: Yes RUE Weight Bearing:  Non weight bearing RLE Weight Bearing: Non weight bearing (ex fix, bone stimulator 3 hours/day)      Mobility  Bed Mobility Overal bed mobility: Needs Assistance Bed Mobility: Supine to Sit     Supine to sit: Min guard, HOB elevated     General bed mobility comments: use of bed rails    Transfers Overall transfer level: Needs assistance Equipment used: None Transfers: Bed to chair/wheelchair/BSC            Lateral/Scoot Transfers: Min assist, +2 physical assistance General transfer comment: Therapists' provide cuing for head/hips relationship but pt with fair<>poor return demo (limited by pain), PT provides assistance holding RLE off of floor to ensure NWB RLE. Pt requires extra time to complete lateral scoot to recliner.    Ambulation/Gait                  Stairs            Wheelchair Mobility     Tilt Bed    Modified Rankin (Stroke Patients Only)       Balance Overall balance assessment: Needs assistance Sitting-balance support: Feet supported, Single extremity supported Sitting balance-Leahy Scale: Good                                       Pertinent Vitals/Pain Pain Assessment Pain Assessment: Faces Faces Pain Scale: Hurts whole lot Pain Location: RUE to light touch, minimal movement Pain Descriptors / Indicators: Discomfort, Grimacing, Guarding, Crying Pain Intervention(s): Limited activity within patient's tolerance, Monitored during session, Repositioned, Premedicated  before session    Home Living Family/patient expects to be discharged to:: Private residence Living Arrangements: Alone Available Help at Discharge: Family;Available PRN/intermittently Type of Home: House Home Access: Stairs to enter Entrance Stairs-Rails: None Entrance Stairs-Number of Steps: 6   Home Layout: One level Home Equipment: Hand held shower head;Cane - single point      Prior Function Prior Level of Function : Independent/Modified  Independent;Driving;History of Falls (last six months)             Mobility Comments: 1 other fall besides this fall, ambulatory without AD, driving, cares for her 3 dogs       Hand Dominance   Dominant Hand: Right    Extremity/Trunk Assessment   Upper Extremity Assessment Upper Extremity Assessment: RUE deficits/detail RUE Deficits / Details: RUE in sling, pt able to perform AROM R wrist flexion/extension    Lower Extremity Assessment Lower Extremity Assessment: RLE deficits/detail (LLE general weakness) RLE Deficits / Details: RLE ex fix, not formally tested but pt able to perform SLR in bed       Communication   Communication: No difficulties  Cognition Arousal/Alertness: Awake/alert Behavior During Therapy: WFL for tasks assessed/performed Overall Cognitive Status: Within Functional Limits for tasks assessed                                          General Comments General comments (skin integrity, edema, etc.): Pt denied dizziness/lightheadedness during session. Therapist provided set up for meal tray.    Exercises     Assessment/Plan    PT Assessment Patient needs continued PT services  PT Problem List Decreased strength;Pain;Decreased range of motion;Decreased activity tolerance;Decreased balance;Decreased mobility;Decreased knowledge of precautions;Decreased safety awareness;Decreased knowledge of use of DME;Decreased skin integrity       PT Treatment Interventions DME instruction;Therapeutic exercise;Balance training;Gait training;Neuromuscular re-education;Functional mobility training;Therapeutic activities;Patient/family education;Cognitive remediation;Wheelchair mobility training;Modalities;Manual techniques    PT Goals (Current goals can be found in the Care Plan section)  Acute Rehab PT Goals Patient Stated Goal: decreased pain, get better PT Goal Formulation: With patient Time For Goal Achievement: 02/01/23 Potential to Achieve  Goals: Good Additional Goals Additional Goal #1: Pt will propel w/c x 150 ft with supervision to increase independence with mobility.    Frequency 7X/week     Co-evaluation PT/OT/SLP Co-Evaluation/Treatment: Yes Reason for Co-Treatment: Complexity of the patient's impairments (multi-system involvement);Necessary to address cognition/behavior during functional activity;For patient/therapist safety;To address functional/ADL transfers PT goals addressed during session: Mobility/safety with mobility;Balance         AM-PAC PT "6 Clicks" Mobility  Outcome Measure Help needed turning from your back to your side while in a flat bed without using bedrails?: A Little Help needed moving from lying on your back to sitting on the side of a flat bed without using bedrails?: A Lot Help needed moving to and from a bed to a chair (including a wheelchair)?: A Lot Help needed standing up from a chair using your arms (e.g., wheelchair or bedside chair)?: Total Help needed to walk in hospital room?: Total Help needed climbing 3-5 steps with a railing? : Total 6 Click Score: 10    End of Session Equipment Utilized During Treatment:  (RUE sling) Activity Tolerance: Patient tolerated treatment well Patient left: in chair;with chair alarm set;with call bell/phone within reach Nurse Communication: Mobility status PT Visit Diagnosis: Pain;Other abnormalities of gait and mobility (R26.89);Difficulty in  walking, not elsewhere classified (R26.2);Muscle weakness (generalized) (M62.81) Pain - Right/Left: Right Pain - part of body: Shoulder    Time: 1610-9604 PT Time Calculation (min) (ACUTE ONLY): 25 min   Charges:   PT Evaluation $PT Eval Low Complexity: 1 Low   PT General Charges $$ ACUTE PT VISIT: 1 Visit         Aleda Grana, PT, DPT 01/18/23, 11:14 AM   Sandi Mariscal 01/18/2023, 11:12 AM

## 2023-01-18 NOTE — Progress Notes (Signed)
  Subjective:  Patient ID: Becky Ward, female    DOB: October 13, 1955,  MRN: [id number]  Chief Complaint  Patient presents with   Fall   Alcohol Intoxication    DOS: 01/17/23 Procedure: Right ankle ORIF and external fixator application  67 y.o. female  seen for postop check.  Postop day 1 status post above procedure.  She states she is having a lot of pain.  Has been nonweightbearing to the right lower extremity.  Review of Systems: Negative except as noted in the HPI. Denies N/V/F/Ch.   Objective:   Vitals:   01/18/23 0828 01/18/23 1553  BP: 99/66 115/88  Pulse: 81 93  Resp: 18 16  Temp: 98.7 F (37.1 C) 98.5 F (36.9 C)  SpO2: 96% 97%   Body mass index is 21.99 kg/m. Constitutional Well developed. Well nourished.  Vascular Foot warm and well perfused. Capillary refill normal to all digits.  Calf is soft and supple, no posterior calf or knee pain, negative Homans' sign  Neurologic Normal speech. Oriented to person, place, and time. Epicritic sensation to light touch grossly absent to the toes on the right foot secondary to nerve block at this time  Dermatologic Dressing clean dry and intact  Orthopedic: Tenderness to palpation noted about the surgical site.  Patient is able to wiggle her toes on the right foot   Multiple view plain film radiographs: EXAM: RIGHT ANKLE - COMPLETE 3+ VIEW   COMPARISON:  01/14/2024   FINDINGS: Internal fixation with plate and screw fixation across the medial and lateral malleolar fractures. Posterior malleolar fracture also noted, minimally displaced. External fixator partially visualized.   IMPRESSION: Internal and external fixation as above. No visible complicating feature. Assessment:   1. Alcoholic intoxication without complication (HCC)   2. Fall, initial encounter   3. Closed trimalleolar fracture of right ankle, initial encounter   4. Other closed displaced fracture of proximal end of right humerus, initial encounter   5.  Closed right trimalleolar fracture, initial encounter    Plan:  Patient was evaluated and treated and all questions answered.  S/p foot right ankle fracture internal and external fixation -Progressing as expected post-operatively. -XR: As above no acute postop complication -WB Status: Nonweightbearing in soft dressing and external fixator to the right lower extremity.  Please offload the right heel at all times and the patient is in bed with pillows underneath the calf to prevent pressure decubitus ulcerations from forming -Sutures: To remain intact until follow-up visit. -Medications: Percocet or other narcotic pain medication as needed -Highly recommend placement in a acute rehab or nursing facility given her right upper extremity injury as well as the right lower extremity nonweightbearing status -Dressing to remain clean dry and intact until patient follows up in 1 week postoperatively [facility] Logan Bores -Podiatry will sign off at this time please call if there is any acute postoperative complication or other concerns, otherwise patient will follow-up in the office next week         Corinna Gab, DPM [facility] / [facility]

## 2023-01-18 NOTE — Progress Notes (Signed)
   01/18/23 1600  Spiritual Encounters  Type of Visit Initial  Care provided to: Pt and family  Referral source Chaplain assessment  Reason for visit Routine spiritual support  OnCall Visit Yes  Spiritual Framework  Presenting Themes Meaning/purpose/sources of inspiration;Coping tools;Community and relationships  Community/Connection Family;Friend(s)  Patient Stress Factors Major life changes;Health changes  Family Stress Factors Major life changes  Interventions  Spiritual Care Interventions Made Established relationship of care and support;Compassionate presence;Reflective listening;Normalization of emotions;Prayer  Intervention Outcomes  Outcomes Awareness of support;Connection to spiritual care  Spiritual Care Plan  Spiritual Care Issues Still Outstanding No further spiritual care needs at this time (see row info)   During rounding visit patient room spoke with her and family. Patient seem to be dealing with anxiety, and some stress. Patient is struggling dealing with her addiction to cigarettes. PT felt like God was punishing her for some mistakes she made. PT said they do believe  and is willing to continue conversation later

## 2023-01-18 NOTE — Progress Notes (Signed)
PROGRESS NOTE    Becky Ward   UUV:[id number] DOB: 1956/03/08  DOA: 01/14/2023 Date of Service: 01/18/23 PCP: Margit Hanks, MD     Brief Narrative / Hospital Course:  Becky Ward is a 67 y.o. female with medical history significant for 67 year old female with a history of hypertension, alcohol use disorder and nicotine dependence who presents to the ED via EMS following a fall onto her right side while walking her dogs, after her dogs ran into another dog.  She landed on her right shoulder with immediate pain of the right shoulder and twisted her ankle and has been unable to walk since the incident.  She hit her head but denies loss of consciousness.  07/22: To ED and admitted to hospitalist service for treatment closed right trimalleolar fracture and right proximal humerus fracture. 07/23: Podiatry and orthopedics evaluated the patient.  Podiatry planning for surgery 07/24: Surgical intervention pending 07/25: ORIF this afternoon 07/26: awaiting placement for SNF  Consultants:  Orthopedics Podiatry  Procedures: 01/17/23 ORIF and application of external fixator for R trimalleolar ankle fracture      ASSESSMENT & PLAN:   Principal Problem:   Closed right trimalleolar fracture, initial encounter Active Problems:   Closed fracture of right proximal humerus   Alcohol use disorder   Alcohol intoxication (HCC)   Hypokalemia   Anemia, mild   Benign essential HTN   Malnutrition of moderate degree   Closed right trimalleolar fracture, initial encounter S/p fracture reduction in the ED POD1 s/p ORIF and external fixation application  Pain control  PT/OT as able SNF rehab pending placement    Closed fracture of right proximal humerus Orthopedics, Dr. Signa Kell consulted Maintain sling as recommended from the ED Not planning any surgical intervention  continue NWB and pain control  Alcohol use disorder Alcohol intoxication - resolved  EtOH level was 214 on  admission  CIWA withdrawal protocol Folic acid, thiamine  Hypokalemia Replace as needed Monitor BMP   Anemia, mild Hemoglobin 10.8 near baseline of 11.1 Monitor periodic CBC   Benign essential HTN Normotensive to low BP Hold antihypertensives  Malnutrition, moderate Dietary following Electrolytes monitored closely   DVT prophylaxis: SCD Pertinent IV fluids/nutrition: No continuous IV fluids Central lines / invasive devices: Foley catheter in place d/t immobility,  Code Status: Full code ACP documentation reviewed: Reviewed 01/16/2023, none on file  Current Admission Status: Inpatient TOC needs / Dispo plan: SNF rehab Barriers to discharge / significant pending items: placement             Subjective / Brief ROS:  NO CP/SOB Bothered by inability to use her arm of course, asks about taping it or something  Pain controlled  Family Communication: none at this time    Objective Findings:  Vitals:   01/17/23 1933 01/17/23 2355 01/18/23 0410 01/18/23 0828  BP: 121/79 105/75 109/68 99/66  Pulse: 77 94 82 81  Resp: 18 18 16 18   Temp: 97.6 F (36.4 C) 98.3 F (36.8 C) 98.6 F (37 C) 98.7 F (37.1 C)  TempSrc:   Oral   SpO2: 95% 95% 94% 96%  Weight:      Height:        Intake/Output Summary (Last 24 hours) at 01/18/2023 1517 Last data filed at 01/18/2023 1018 Gross per 24 hour  Intake 1480 ml  Output 820 ml  Net 660 ml   Filed Weights   01/16/23 0500 01/17/23 0500 01/17/23 1447  Weight: 59.9 kg 56.3 kg 56.3 kg  Examination:  Physical Exam Constitutional:      General: She is not in acute distress. Cardiovascular:     Rate and Rhythm: Normal rate and regular rhythm.  Pulmonary:     Effort: Pulmonary effort is normal.     Breath sounds: Normal breath sounds.  Skin:    General: Skin is warm and dry.  Neurological:     General: No focal deficit present.     Mental Status: She is alert.  Psychiatric:        Mood and Affect: Mood normal.         Behavior: Behavior normal.          Scheduled Medications:   Chlorhexidine Gluconate Cloth  6 each Topical Q0600   feeding supplement  1 Container Oral TID BM   folic acid  1 mg Oral Daily   mupirocin ointment  1 Application Nasal BID   nicotine  7 mg Transdermal Daily   thiamine  100 mg Oral Daily   Or   thiamine  100 mg Intravenous Daily    Continuous Infusions:   PRN Medications:  morphine injection, senna-docusate  Antimicrobials from admission:  Anti-infectives (From admission, onward)    None           Data Reviewed:  I have personally reviewed the following...  CBC: Recent Labs  Lab 01/14/23 1840 01/15/23 1404 01/16/23 0511 01/18/23 0332  WBC 13.8* 8.4 7.5 8.0  NEUTROABS 10.3* 6.1 4.8  --   HGB 10.8* 9.4* 9.2* 8.1*  HCT 32.8* 29.3* 27.5* 25.0*  MCV 98.8 101.0* 99.6 100.8*  PLT 483* 375 351 337   Basic Metabolic Panel: Recent Labs  Lab 01/14/23 1840 01/15/23 1404 01/16/23 0511 01/17/23 0638 01/18/23 0332  NA 138 135 135  --  136  K 3.3* 3.2* 4.4 3.7 3.5  CL 106 106 108  --  103  CO2 19* 23 22  --  27  GLUCOSE 104* 146* 100*  --  213*  BUN 10 13 11   --  14  CREATININE 0.74 0.73 0.53  --  0.48  CALCIUM 8.2* 7.9* 8.0*  --  8.2*  MG  --   --  2.3 2.1 2.1  PHOS  --   --  2.0* 4.1 3.6   GFR: Estimated Creatinine Clearance: 56.4 mL/min (by C-G formula based on SCr of 0.48 mg/dL). Liver Function Tests: Recent Labs  Lab 01/14/23 1840  AST 30  ALT 21  ALKPHOS 56  BILITOT 0.3  PROT 6.7  ALBUMIN 4.1   No results for input(s): "LIPASE", "AMYLASE" in the last 168 hours. No results for input(s): "AMMONIA" in the last 168 hours. Coagulation Profile: Recent Labs  Lab 01/14/23 1856  INR 1.2   Cardiac Enzymes: No results for input(s): "CKTOTAL", "CKMB", "CKMBINDEX", "TROPONINI" in the last 168 hours. BNP (last 3 results) No results for input(s): "PROBNP" in the last 8760 hours. HbA1C: No results for input(s): "HGBA1C" in  the last 72 hours. CBG: Recent Labs  Lab 01/14/23 1852  GLUCAP 97   Lipid Profile: No results for input(s): "CHOL", "HDL", "LDLCALC", "TRIG", "CHOLHDL", "LDLDIRECT" in the last 72 hours. Thyroid Function Tests: No results for input(s): "TSH", "T4TOTAL", "FREET4", "T3FREE", "THYROIDAB" in the last 72 hours. Anemia Panel: No results for input(s): "VITAMINB12", "FOLATE", "FERRITIN", "TIBC", "IRON", "RETICCTPCT" in the last 72 hours. Most Recent Urinalysis On File:     Component Value Date/Time   COLORURINE STRAW (A) 08/08/2021 0610   APPEARANCEUR CLEAR (A) 08/08/2021 1610  APPEARANCEUR Clear 01/06/2014 0909   LABSPEC 1.003 (L) 08/08/2021 0610   LABSPEC 1.017 01/06/2014 0909   PHURINE 5.0 08/08/2021 0610   GLUCOSEU NEGATIVE 08/08/2021 0610   GLUCOSEU Negative 01/06/2014 0909   HGBUR NEGATIVE 08/08/2021 0610   BILIRUBINUR NEGATIVE 08/08/2021 0610   BILIRUBINUR Negative 01/06/2014 0909   KETONESUR NEGATIVE 08/08/2021 0610   PROTEINUR NEGATIVE 08/08/2021 0610   NITRITE NEGATIVE 08/08/2021 0610   LEUKOCYTESUR NEGATIVE 08/08/2021 0610   LEUKOCYTESUR 2+ 01/06/2014 0909   Sepsis Labs: @LABRCNTIP (procalcitonin:4,lacticidven:4) Microbiology: Recent Results (from the past 240 hour(s))  Surgical PCR screen     Status: None   Collection Time: 01/15/23  5:19 AM   Specimen: Nasal Mucosa; Nasal Swab  Result Value Ref Range Status   MRSA, PCR NEGATIVE NEGATIVE Final   Staphylococcus aureus NEGATIVE NEGATIVE Final    Comment: (NOTE) The Xpert SA Assay (FDA approved for NASAL specimens in patients 71 years of age and older), is one component of a comprehensive surveillance program. It is not intended to diagnose infection nor to guide or monitor treatment. Performed at [facility], [address], [place], [place] [address]       Radiology Studies last 3 days: DG Foot Complete Right  Result Date: 01/17/2023 CLINICAL DATA:  Postoperative EXAM: RIGHT FOOT COMPLETE - 3+  VIEW COMPARISON:  None Available. FINDINGS: Orthopedic hardware is seen in the posterior talocalcaneal region and ankle with skin staples. No other fracture or dislocation identified. Joint spaces are maintained. Soft tissues are within normal limits. IMPRESSION: Orthopedic hardware is seen in the posterior talocalcaneal region and ankle with skin staples. Electronically Signed   By: Darliss Cheney M.D.   On: 01/17/2023 21:51   DG Ankle Complete Right  Result Date: 01/17/2023 CLINICAL DATA:  Postop check EXAM: RIGHT ANKLE - COMPLETE 3+ VIEW COMPARISON:  01/14/2024 FINDINGS: Internal fixation with plate and screw fixation across the medial and lateral malleolar fractures. Posterior malleolar fracture also noted, minimally displaced. External fixator partially visualized. IMPRESSION: Internal and external fixation as above. No visible complicating feature. Electronically Signed   By: Charlett Nose M.D.   On: 01/17/2023 21:08   DG Tibia/Fibula Right  Result Date: 01/17/2023 CLINICAL DATA:  Postop check EXAM: RIGHT TIBIA AND FIBULA - 2 VIEW COMPARISON:  01/14/2023 FINDINGS: Plate and screw fixation noted in the distal tibia and fibula. External fixer device in place. Anatomic alignment. No visible complicating feature. IMPRESSION: Internal and external fixation across the medial and lateral malleolar fractures. No visible complicating feature. Electronically Signed   By: Charlett Nose M.D.   On: 01/17/2023 21:08   DG MINI C-ARM IMAGE ONLY  Result Date: 01/17/2023 There is no interpretation for this exam.  This order is for images obtained during a surgical procedure.  Please See "Surgeries" Tab for more information regarding the procedure.   [place] OR NERVE BLOCK-IMAGE ONLY [facility])  Result Date: 01/17/2023 There is no interpretation for this exam.  This order is for images obtained during a surgical procedure.  Please See "Surgeries" Tab for more information regarding the procedure.   CT ANKLE RIGHT WO  CONTRAST  Result Date: 01/14/2023 CLINICAL DATA:  Trauma EXAM: CT OF THE RIGHT ANKLE WITHOUT CONTRAST TECHNIQUE: Multidetector CT imaging of the right ankle was performed according to the standard protocol. Multiplanar CT image reconstructions were also generated. RADIATION DOSE REDUCTION: This exam was performed according to the departmental dose-optimization program which includes automated exposure control, adjustment of the mA and/or kV according to patient size  and/or use of iterative reconstruction technique. COMPARISON:  Right ankle x-ray 01/14/2023 FINDINGS: Bones/Joint/Cartilage The bones are osteopenic. There is an acute comminuted medial malleolus fracture. The tip of the medial malleolus is displaced inferiorly 6 mm. There is also an acute distal fibular fracture which is markedly comminuted at the level of the ankle mortise. The tip of the lateral malleolus as well aligned, but the proximal fracture fragment is displaced anteriorly 1 cm in there is 1 cm of overlap. There is anterior dislocation of the anterior talus in relation to the talar dome. There is a mildly comminuted posterior malleolar fracture. The posterior malleolus as well aligned. The posterior aspect of the distal tibia approximate the anterior talus. The talar dome appears intact. Ligaments Suboptimally assessed by CT. Muscles and Tendons There is intramuscular edema and edema surrounding tendons in the fracture sites. No foreign body. Soft tissues There is subcutaneous edema and swelling surrounding the ankle and lateral aspect of the foot. IMPRESSION: 1. Acute comminuted medial malleolus fracture. 2. Acute displaced comminuted distal fibular fracture. 3. Acute mildly comminuted posterior malleolar fracture. 4. Anterior dislocation of the anterior talus in relation to the talar dome. 5. The posterior aspect of the distal tibia approximates the anterior talus. The talar dome appears intact. Electronically Signed   By: Darliss Cheney  M.D.   On: 01/14/2023 21:52   DG Ankle 2 Views Right  Result Date: 01/14/2023 CLINICAL DATA:  Postreduction EXAM: RIGHT ANKLE - 2 VIEW COMPARISON:  Radiographs dated January 14, 2023 FINDINGS: Status post reduction and casting redemonstrate trimalleolar fractures. Interval partial improvement of the posterior talar dislocation. IMPRESSION: Status post reduction and casting redemonstrate trimalleolar fractures. Interval partial improvement of the posterior talar dislocation. Electronically Signed   By: Larose Hires D.O.   On: 01/14/2023 21:15   DG Chest Portable 1 View  Result Date: 01/14/2023 CLINICAL DATA:  Fall EXAM: PORTABLE CHEST 1 VIEW COMPARISON:  08/08/2021 FINDINGS: The heart size and mediastinal contours are within normal limits. Aortic atherosclerosis. Both lungs are clear. Acute proximal right humerus fracture. IMPRESSION: No active disease. Acute proximal right humerus fracture. Electronically Signed   By: Jasmine Pang M.D.   On: 01/14/2023 20:09   DG Ankle Right Port  Result Date: 01/14/2023 CLINICAL DATA:  Fall with ankle pain EXAM: PORTABLE RIGHT ANKLE - 2 VIEW COMPARISON:  None Available. FINDINGS: Acute mildly comminuted trimalleolar fracture with posterior dislocation of the talus with respect to the distal tibia. Distal fibular and posterior malleolar fracture fragments are displaced. Copious soft tissue swelling. IMPRESSION: Acute trimalleolar fracture with posterior dislocation of the talus. Electronically Signed   By: Jasmine Pang M.D.   On: 01/14/2023 20:08   DG Pelvis Portable  Result Date: 01/14/2023 CLINICAL DATA:  Fall EXAM: PORTABLE PELVIS 1-2 VIEWS COMPARISON:  None Available. FINDINGS: SI joints are non widened. Pubic symphysis and rami appear intact. Mild chronic sclerosis at the humeral heads. No fracture or dislocation IMPRESSION: No acute osseous abnormality Electronically Signed   By: Jasmine Pang M.D.   On: 01/14/2023 20:03   DG Tibia/Fibula Right  Result Date:  01/14/2023 CLINICAL DATA:  Fall EXAM: RIGHT TIBIA AND FIBULA - 2 VIEW COMPARISON:  None Available. FINDINGS: Proximal and mid tibia and fibula appear intact. Acute fracture dislocation at the ankle. Diffuse soft tissue swelling IMPRESSION: Acute fracture dislocation at the ankle. See separately dictated ankle report Electronically Signed   By: Jasmine Pang M.D.   On: 01/14/2023 20:02   CT HEAD WO CONTRAST (  )  Result Date: 01/14/2023 CLINICAL DATA:  Head trauma, moderate-severe; Neck trauma (Age >= 65y) EXAM: CT HEAD WITHOUT CONTRAST CT MAXILLOFACIAL WITHOUT CONTRAST CT CERVICAL SPINE WITHOUT CONTRAST TECHNIQUE: Multidetector CT imaging of the head, cervical spine, and maxillofacial structures were performed using the standard protocol without intravenous contrast. Multiplanar CT image reconstructions of the cervical spine and maxillofacial structures were also generated. RADIATION DOSE REDUCTION: This exam was performed according to the departmental dose-optimization program which includes automated exposure control, adjustment of the mA and/or kV according to patient size and/or use of iterative reconstruction technique. COMPARISON:  CT Head 08/08/21 FINDINGS: CT HEAD FINDINGS Brain: No hemorrhage. No hydrocephalus. No extra-axial fluid collection. Unchanged mineralization of the right thalamus. No CT evidence of an acute cortical infarct. There is sequela of mild chronic microvascular ischemic change. Vascular: No hyperdense vessel or unexpected calcification. Skull: Normal. Negative for fracture or focal lesion. Other: None. CT MAXILLOFACIAL FINDINGS Osseous: No fracture or mandibular dislocation. 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 Orbits: Negative. No traumatic or inflammatory finding. Sinuses: No middle ear or mastoid effusion. Paranasal sinuses are notable for right maxillary antrostomy and osseous findings suggestive of chronic right maxillary sinusitis. Orbits are unremarkable.  Soft tissues: Negative. CT CERVICAL SPINE FINDINGS Alignment: Trace anterolisthesis of C2 on C3. Skull base and vertebrae: No acute fracture. No primary bone lesion or focal pathologic process. Soft tissues and spinal canal: No prevertebral fluid or swelling. No visible canal hematoma. Disc levels:  No evidence of high-grade spinal canal stenosis. Upper chest: Negative. Other: None IMPRESSION: 1. No acute intracranial abnormality. 2. No acute facial bone fracture. 3. No acute fracture or traumatic listhesis of the cervical spine. 4. 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 Electronically Signed   By: Lorenza Cambridge M.D.   On: 01/14/2023 20:02   CT Cervical Spine Wo Contrast  Result Date: 01/14/2023 CLINICAL DATA:  Head trauma, moderate-severe; Neck trauma (Age >= 65y) EXAM: CT HEAD WITHOUT CONTRAST CT MAXILLOFACIAL WITHOUT CONTRAST CT CERVICAL SPINE WITHOUT CONTRAST TECHNIQUE: Multidetector CT imaging of the head, cervical spine, and maxillofacial structures were performed using the standard protocol without intravenous contrast. Multiplanar CT image reconstructions of the cervical spine and maxillofacial structures were also generated. RADIATION DOSE REDUCTION: This exam was performed according to the departmental dose-optimization program which includes automated exposure control, adjustment of the mA and/or kV according to patient size and/or use of iterative reconstruction technique. COMPARISON:  CT Head 08/08/21 FINDINGS: CT HEAD FINDINGS Brain: No hemorrhage. No hydrocephalus. No extra-axial fluid collection. Unchanged mineralization of the right thalamus. No CT evidence of an acute cortical infarct. There is sequela of mild chronic microvascular ischemic change. Vascular: No hyperdense vessel or unexpected calcification. Skull: Normal. Negative for fracture or focal lesion. Other: None. CT MAXILLOFACIAL FINDINGS Osseous: No fracture or mandibular dislocation. Severe odontogenic disease with  multiple dental caries. Orbits: Negative. No traumatic or inflammatory finding. Sinuses: No middle ear or mastoid effusion. Paranasal sinuses are notable for right maxillary antrostomy and osseous findings suggestive of chronic right maxillary sinusitis. Orbits are unremarkable. Soft tissues: Negative. CT CERVICAL SPINE FINDINGS Alignment: Trace anterolisthesis of C2 on C3. Skull base and vertebrae: No acute fracture. No primary bone lesion or focal pathologic process. Soft tissues and spinal canal: No prevertebral fluid or swelling. No visible canal hematoma. Disc levels:  No evidence of high-grade spinal canal stenosis. Upper chest: Negative. Other: None IMPRESSION: 1. No acute intracranial abnormality. 2. No acute facial bone fracture. 3. No acute fracture or  traumatic listhesis of the cervical spine. 4. 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 Electronically Signed   By: Lorenza Cambridge M.D.   On: 01/14/2023 20:02   CT MAXILLOFACIAL WO CONTRAST  Result Date: 01/14/2023 CLINICAL DATA:  Head trauma, moderate-severe; Neck trauma (Age >= 65y) EXAM: CT HEAD WITHOUT CONTRAST CT MAXILLOFACIAL WITHOUT CONTRAST CT CERVICAL SPINE WITHOUT CONTRAST TECHNIQUE: Multidetector CT imaging of the head, cervical spine, and maxillofacial structures were performed using the standard protocol without intravenous contrast. Multiplanar CT image reconstructions of the cervical spine and maxillofacial structures were also generated. RADIATION DOSE REDUCTION: This exam was performed according to the departmental dose-optimization program which includes automated exposure control, adjustment of the mA and/or kV according to patient size and/or use of iterative reconstruction technique. COMPARISON:  CT Head 08/08/21 FINDINGS: CT HEAD FINDINGS Brain: No hemorrhage. No hydrocephalus. No extra-axial fluid collection. Unchanged mineralization of the right thalamus. No CT evidence of an acute cortical infarct. There is sequela of  mild chronic microvascular ischemic change. Vascular: No hyperdense vessel or unexpected calcification. Skull: Normal. Negative for fracture or focal lesion. Other: None. CT MAXILLOFACIAL FINDINGS Osseous: No fracture or mandibular dislocation. 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 Orbits: Negative. No traumatic or inflammatory finding. Sinuses: No middle ear or mastoid effusion. Paranasal sinuses are notable for right maxillary antrostomy and osseous findings suggestive of chronic right maxillary sinusitis. Orbits are unremarkable. Soft tissues: Negative. CT CERVICAL SPINE FINDINGS Alignment: Trace anterolisthesis of C2 on C3. Skull base and vertebrae: No acute fracture. No primary bone lesion or focal pathologic process. Soft tissues and spinal canal: No prevertebral fluid or swelling. No visible canal hematoma. Disc levels:  No evidence of high-grade spinal canal stenosis. Upper chest: Negative. Other: None IMPRESSION: 1. No acute intracranial abnormality. 2. No acute facial bone fracture. 3. No acute fracture or traumatic listhesis of the cervical spine. 4. 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 Electronically Signed   By: Lorenza Cambridge M.D.   On: 01/14/2023 20:02   DG Humerus Right  Result Date: 01/14/2023 CLINICAL DATA:  Fall EXAM: RIGHT HUMERUS - 2+ VIEW COMPARISON:  None Available. FINDINGS: AC joint is intact. Acute fracture involving the humeral neck with displaced greater tuberosity fracture fragment. No humeral head dislocation IMPRESSION: Acute humeral neck fracture with displaced greater tuberosity fracture fragment. Electronically Signed   By: Jasmine Pang M.D.   On: 01/14/2023 20:01             LOS: 4 days      Sunnie Nielsen, DO [facility] 01/18/2023, 3:17 PM    Dictation software may have been used to generate the above note. Typos may occur and escape review in typed/dictated notes. Please contact Dr Lyn Hollingshead directly for  clarity if needed.  Staff may message me via secure chat in Epic  but this may not receive an immediate response,  please page me for urgent matters!  If 7PM-7AM, please contact night coverage [email or web address]

## 2023-01-18 NOTE — TOC Progression Note (Signed)
Transition of Care [facility]) - Progression Note    Patient Details  Name: Becky Ward MRN: [id number] Date of Birth: [birth year]  Transition [facility] CM/SW Contact  Marlowe Sax, RN Phone Number: 01/18/2023, 10:44 AM  Clinical Narrative:    Met with the patient to discuss DC plan and needs She stated that she has no one to help her aty home and agrees to a bed search to STR    Expected Discharge Plan: Home w Home Health Services Barriers to Discharge: Continued Medical Work up  Expected Discharge Plan and Services       Living arrangements for the past 2 months: Skilled Nursing Facility                                       Social Determinants of Health (SDOH) Interventions SDOH Screenings   Food Insecurity: Food Insecurity Present (01/15/2023)  Housing: High Risk (01/15/2023)  Transportation Needs: Unmet Transportation Needs (01/15/2023)  Utilities: At Risk (01/15/2023)  Financial Resource Strain: High Risk (06/08/2022)   Received from [facility], [facility]  Physical Activity: Inactive (06/08/2022)   Received from [facility], [facility]  Social Connections: Unknown (06/08/2022)   Received from [facility]  Recent Concern: Social Connections - Socially Isolated (06/08/2022)   Received from [facility]  Stress: Stress Concern Present (06/08/2022)   Received from [facility], [facility]  Tobacco Use: High Risk (01/17/2023)    Readmission Risk Interventions     No data to display

## 2023-01-18 NOTE — Plan of Care (Signed)

## 2023-01-18 NOTE — Care Management Important Message (Signed)
Important Message  Patient Details  Name: Becky Ward MRN: [id number] Date of Birth: Jan 19, 1956   Medicare Important Message Given:  Yes     [name] A Siris Hoos 01/18/2023, 2:44 PM

## 2023-01-18 NOTE — [place] FL2 (Signed)
[place] MEDICAID [id number] LEVEL OF CARE FORM     IDENTIFICATION  Patient Name: Becky Ward Birthdate: 1956/03/05 Sex: female Admission Date (Current Location): 01/14/2023  [facility] and [place] Number:  Chiropodist and Address:  [facility], [address] [place], [place] [address]      Provider Number: [id number]  Attending Physician Name and Address:  Sunnie Nielsen, DO  Relative Name and Phone Number:  Leslee Home [phone]    Current Level of Care: Hospital Recommended Level of Care: Skilled Nursing Facility Prior Approval Number:    Date Approved/Denied:   PASRR Number: [id number]  Discharge Plan: SNF    Current Diagnoses: Patient Active Problem List   Diagnosis Date Noted   Malnutrition of moderate degree 01/16/2023   Closed right trimalleolar fracture, initial encounter 01/14/2023   Alcohol use disorder 01/14/2023   Closed fracture of right proximal humerus 01/14/2023   Alcohol intoxication (HCC) 01/14/2023   Hypokalemia 01/14/2023   Closed displaced fracture of proximal phalanx of left little finger 08/22/2021   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   Thrombocytosis 04/24/2021   Elevated alkaline phosphatase level 04/07/2021   Benign essential HTN 05/13/2020   History of subarachnoid hemorrhage 03/09/2020   Chronic gastritis without bleeding 04/23/2019   Anemia, mild 09/01/2018   Hypercholesterolemia 09/01/2018   Low vitamin D level 09/01/2018   Pulmonary nodule 07/26/2017   Tobacco abuse 03/16/2014    Orientation RESPIRATION BLADDER Height & Weight     Self, Time, Situation, Place  Normal Continent Weight: 56.3 kg Height:  5\' 3"  (160 cm)  BEHAVIORAL SYMPTOMS/MOOD NEUROLOGICAL BOWEL NUTRITION STATUS      Continent Diet (Regular)  AMBULATORY STATUS COMMUNICATION OF NEEDS Skin   Extensive Assist Verbally Normal, Surgical wounds (cast on leg, arm in sling)                       Personal Care Assistance Level  of Assistance  Bathing, Feeding, Dressing Bathing Assistance: Limited assistance Feeding assistance: Independent Dressing Assistance: Limited assistance     Functional Limitations Info  Sight, Hearing, Speech Sight Info: Adequate Hearing Info: Adequate Speech Info: Adequate    SPECIAL CARE FACTORS FREQUENCY  PT (By licensed PT), OT (By licensed OT)                    Contractures Contractures Info: Not present    Additional Factors Info  Code Status, Allergies Code Status Info: full code Allergies Info: Banana, Ciprofloxacin, Lactose Intolerance (Gi), Codeine           Current Medications (01/18/2023):  This is the current hospital active medication list Current Facility-Administered Medications  Medication Dose Route Frequency Provider Last Rate Last Admin   Chlorhexidine Gluconate Cloth 2 % PADS 6 each  6 each Topical Q0600 Sunnie Nielsen, DO       feeding supplement (BOOST / RESOURCE BREEZE) liquid 1 Container  1 Container Oral TID BM Loyce Dys, MD   1 Container at 01/18/23 0910   folic acid (FOLVITE) tablet 1 mg  1 mg Oral Daily Andris Baumann, MD   1 mg at 01/18/23 0907   HYDROcodone-acetaminophen (NORCO/VICODIN) 5-325 MG per tablet 2 tablet  2 tablet Oral Q6H Sunnie Nielsen, DO   2 tablet at 01/18/23 0549   morphine (PF) 2 MG/ML injection 2 mg  2 mg Intravenous Q2H PRN Andris Baumann, MD   2 mg at 01/18/23 0921   mupirocin ointment (BACTROBAN) 2 %  1 Application  1 Application Nasal BID Andris Baumann, MD   1 Application at 01/18/23 6578   nicotine (NICODERM CQ - dosed in mg/24 hr) patch 7 mg  7 mg Transdermal Daily Rosezetta Schlatter T, MD   7 mg at 01/18/23 0915   senna-docusate (Senokot-S) tablet 1 tablet  1 tablet Oral QHS PRN Andris Baumann, MD       thiamine (VITAMIN B1) tablet 100 mg  100 mg Oral Daily Andris Baumann, MD   100 mg at 01/18/23 4696   Or   thiamine (VITAMIN B1) injection 100 mg  100 mg Intravenous Daily Andris Baumann, MD          Discharge Medications: Please see discharge summary for a list of discharge medications.  Relevant Imaging Results:  Relevant Lab Results:   Additional Information SS# [id number]  Marlowe Sax, RN

## 2023-01-18 NOTE — Evaluation (Signed)
Occupational Therapy Evaluation Patient Details Name: Becky Ward MRN: [id number] DOB: 1955/07/17 Today's Date: 01/18/2023   History of Present Illness Pt is a 67 year old female admitted after a fall with Closed right trimalleolar fracture, closed fracture of R proximal humerus, now s/p  Open reduction internal fixation right ankle, Application of external fixator right on 01/17/23;  PMH significant for hypertension, alcohol use disorder and nicotine dependence, rheumatoid arthritis   Clinical Impression   Chart reviewed, pt greeted in bed, alert and oriented x4, agreeable to OT devaluation. Co tx completed with PT on this date. PTA pt is generally MOD I-I in ADL/IADL, amb with no AD. Pt presents with deficits in balance, activity tolerance, RUE function, strength all affecting safe and optimal ADL completion. CGA required for bed mobility with intermittent vcs for technique, MIN A +2 with step by step vcs for lateral scoot to the chair. Overall good adherence to NWBing precautions with frequent vcs. SET UP required for feeding/grooming tasks. Pt is left in bedside chair, all needs met. OT will follow acutely.       Recommendations for follow up therapy are one component of a multi-disciplinary discharge planning process, led by the attending physician.  Recommendations may be updated based on patient status, additional functional criteria and insurance authorization.   Assistance Recommended at Discharge Intermittent Supervision/Assistance  Patient can return home with the following A lot of help with bathing/dressing/bathroom;A little help with walking and/or transfers;Assistance with cooking/housework;Direct supervision/assist for medications management;Assist for transportation;Help with stairs or ramp for entrance    Functional Status Assessment  Patient has had a recent decline in their functional status and demonstrates the ability to make significant improvements in function in a  reasonable and predictable amount of time.  Equipment Recommendations  Other (comment) (per next venue of care)    Recommendations for Other Services       Precautions / Restrictions Precautions Precautions: Fall Required Braces or Orthoses: Sling;Other Brace Other Brace: RLE external fixator Restrictions Weight Bearing Restrictions: Yes RUE Weight Bearing: Non weight bearing (in sling) RLE Weight Bearing: Non weight bearing      Mobility Bed Mobility Overal bed mobility: Needs Assistance Bed Mobility: Supine to Sit     Supine to sit: Min guard, HOB elevated          Transfers Overall transfer level: Needs assistance Equipment used: None Transfers: Bed to chair/wheelchair/BSC            Lateral/Scoot Transfers: Min assist, +2 physical assistance        Balance Overall balance assessment: Needs assistance Sitting-balance support: Feet supported, Single extremity supported Sitting balance-Leahy Scale: Good                                     ADL either performed or assessed with clinical judgement   ADL Overall ADL's : Needs assistance/impaired Eating/Feeding: Set up;Sitting Eating/Feeding Details (indicate cue type and reason): assist with cutting food/opening packages/containers Grooming: Wash/dry face;Sitting;Set up Grooming Details (indicate cue type and reason): with LUE Upper Body Bathing: Moderate assistance Upper Body Bathing Details (indicate cue type and reason): anticipate Lower Body Bathing: Moderate assistance Lower Body Bathing Details (indicate cue type and reason): anticipate Upper Body Dressing : Maximal assistance Upper Body Dressing Details (indicate cue type and reason): to adjust sling, pt with limited tolerance on this date Lower Body Dressing: Maximal assistance Lower Body Dressing Details (indicate cue type  and reason): sock Toilet Transfer: Minimal assistance;+2 for physical assistance;+2 for  safety/equipment Toilet Transfer Details (indicate cue type and reason): simulated, squat pivot transfer to bedside chair; PT assists with RLE, step by step vcs for technique/encouragement                 Vision Patient Visual Report: No change from baseline              Pertinent Vitals/Pain Pain Assessment Pain Assessment: Faces Faces Pain Scale: Hurts whole lot Pain Location: RUE with minimal movement/light tough Pain Descriptors / Indicators: Discomfort, Grimacing, Guarding, Crying Pain Intervention(s): Limited activity within patient's tolerance, Monitored during session, Premedicated before session, Repositioned     Hand Dominance Right   Extremity/Trunk Assessment Upper Extremity Assessment Upper Extremity Assessment: RUE deficits/detail RUE Deficits / Details: wrist/hand ROM appear WFL; pt unable to tolerate any A/PROM to elbow/forearm; will assess in future sessions RUE: Unable to fully assess due to pain   Lower Extremity Assessment Lower Extremity Assessment: RLE deficits/detail RLE Deficits / Details: s/p open reduction internal fixation right ankle, Application of external fixator right on 01/17/23       Communication Communication Communication: No difficulties   Cognition Arousal/Alertness: Awake/alert Behavior During Therapy: WFL for tasks assessed/performed Overall Cognitive Status: Within Functional Limits for tasks assessed                                       General Comments  vss throughout; all lines/leads, sling, ex fix, bone stimulator intact pre/post sessoin    Exercises Other Exercises Other Exercises: edu pt re: role of OT, role of rehab, sling use, safe ADL Completion   Shoulder Instructions      Home Living Family/patient expects to be discharged to:: Private residence Living Arrangements: Alone Available Help at Discharge: Family;Available PRN/intermittently Type of Home: House Home Access: Stairs to  enter [employer] of Steps: 6 Entrance Stairs-Rails: None Home Layout: One level     Bathroom Shower/Tub: Chief Strategy Officer: Standard Bathroom Accessibility: No   Home Equipment: Hand held shower head;Cane - single point          Prior Functioning/Environment Prior Level of Function : Independent/Modified Independent;Driving;History of Falls (last six months)             Mobility Comments: 1 other fall besides this fall, ambulatory without AD, driving, cares for her 3 dogs ADLs Comments: MOD I in ADL/IADL, cares for her dogs        OT Problem List: Impaired balance (sitting and/or standing);Decreased activity tolerance;Decreased safety awareness;Decreased knowledge of precautions;Decreased knowledge of use of DME or AE      OT Treatment/Interventions: Self-care/ADL training;Therapeutic exercise;Therapeutic activities;Balance training;DME and/or AE instruction    OT Goals(Current goals can be found in the care plan section) Acute Rehab OT Goals Patient Stated Goal: get stronger OT Goal Formulation: With patient Time For Goal Achievement: 02/01/23 Potential to Achieve Goals: Good ADL Goals Pt Will Perform Grooming: with modified independence;sitting Pt Will Perform Upper Body Dressing: with modified independence Pt Will Perform Lower Body Dressing: with modified independence Pt Will Transfer to Toilet: with modified independence;squat pivot transfer Pt Will Perform Toileting - Clothing Manipulation and hygiene: with modified independence;sitting/lateral leans  OT Frequency: Min 1X/week    Co-evaluation PT/OT/SLP Co-Evaluation/Treatment: Yes Reason for Co-Treatment: Complexity of the patient's impairments (multi-system involvement);Necessary to address cognition/behavior during functional activity;For patient/therapist  safety;To address functional/ADL transfers PT goals addressed during session: Mobility/safety with mobility;Balance         AM-PAC OT "6 Clicks" Daily Activity     Outcome Measure Help from another person eating meals?: A Little Help from another person taking care of personal grooming?: A Little Help from another person toileting, which includes using toliet, bedpan, or urinal?: A Lot Help from another person bathing (including washing, rinsing, drying)?: A Lot Help from another person to put on and taking off regular upper body clothing?: A Lot Help from another person to put on and taking off regular lower body clothing?: A Lot 6 Click Score: 14   End of Session Equipment Utilized During Treatment: Other (comment) (sling, ex fix, bone stimulator) Nurse Communication: Other (comment) (NT re pt tranfser status)  Activity Tolerance: Patient tolerated treatment well Patient left: in chair;with call bell/phone within reach;with chair alarm set  OT Visit Diagnosis: Other abnormalities of gait and mobility (R26.89);Unsteadiness on feet (R26.81)                Time: 1610-9604 OT Time Calculation (min): 25 min Charges:  OT General Charges $OT Visit: 1 Visit OT Evaluation $OT Eval Moderate Complexity: 1 Mod  Oleta Mouse, OTD OTR/L  01/18/23, 12:12 PM

## 2023-01-18 NOTE — Plan of Care (Signed)
  Problem: Clinical Measurements: Goal: Ability to maintain clinical measurements within normal limits will improve Outcome: Progressing   Problem: Nutrition: Goal: Adequate nutrition will be maintained Outcome: Progressing   Problem: Coping: Goal: Level of anxiety will decrease Outcome: Progressing   Problem: Safety: Goal: Ability to remain free from injury will improve Outcome: Progressing   Problem: Skin Integrity: Goal: Risk for impaired skin integrity will decrease Outcome: Progressing   

## 2023-01-19 DIAGNOSIS — S82851A Displaced trimalleolar fracture of right lower leg, initial encounter for closed fracture: Secondary | ICD-10-CM | POA: Diagnosis not present

## 2023-01-19 NOTE — Progress Notes (Signed)
Physical Therapy Treatment Patient Details Name: Becky Ward MRN: [id number] DOB: [birth year] Today's Date: 01/19/2023   History of Present Illness Pt is a 67 year old female admitted after a fall with Closed right trimalleolar fracture, closed fracture of R proximal humerus, now s/p  Open reduction internal fixation right ankle, Application of external fixator right on 01/17/23;  PMH significant for hypertension, alcohol use disorder and nicotine dependence, rheumatoid arthritis    PT Comments  Skilled session focused on transfer training (lateral scoots to the left from bed >w/c >recliner) and introducing w/c safety/management.  Pt required min to mod A +2 for safety to manage chairs and insure R LE/R UE NWB .  Pt reported severe pain in R LE with transfers and required greater assist with transfer from w/c > recliner.  Continued PT will assist pt towards greater safety and functional mobility to increase independence.    Assistance Recommended at Discharge Frequent or constant Supervision/Assistance  If plan is discharge home, recommend the following:  Can travel by private vehicle    Two people to help with walking and/or transfers;Two people to help with bathing/dressing/bathroom;Help with stairs or ramp for entrance;Assist for transportation;Assistance with feeding;Assistance with Engineer, technical sales cushion (measurements PT) (w/c (possibly L one arm drive w/c), drop arm BSC)    Recommendations for Other Services       Precautions / Restrictions Precautions Precautions: Fall Required Braces or Orthoses: Sling;Other Brace Other Brace: RLE external fixator Restrictions Weight Bearing Restrictions: Yes RUE Weight Bearing: Non weight bearing RLE Weight Bearing: Non weight bearing     Mobility  Bed Mobility Overal bed mobility: Needs Assistance Bed Mobility: Supine to Sit     Supine to sit: Supervision     General bed mobility  comments: use of bed rails    Transfers Overall transfer level: Needs assistance Equipment used: None Transfers: Bed to chair/wheelchair/BSC            Lateral/Scoot Transfers: Mod assist, +2 physical assistance General transfer comment: provide cuing for head/hips relationship but pt with fair return demo (limited by pain), PTA provides assistance holding RLE off of floor to ensure NWB RLE. Pt requires extra time to complete lateral scoot to w/c then to recliner.  Scooted to the Left side for both transfers.  Educated pt on w/c  brakes and positioning.    Ambulation/Gait                   Stairs             Wheelchair Mobility     Tilt Bed    Modified Rankin (Stroke Patients Only)       Balance Overall balance assessment: Needs assistance Sitting-balance support: Feet supported, Single extremity supported                                        Cognition Arousal/Alertness: Awake/alert Behavior During Therapy: WFL for tasks assessed/performed Overall Cognitive Status: Within Functional Limits for tasks assessed                                          Exercises      General Comments General comments (skin integrity, edema, etc.): vss throughout;sling, ex fix, bone stimulator intact pre/post session  Pertinent Vitals/Pain Pain Assessment Faces Pain Scale: Hurts whole lot Pain Location: RUE with minimal movement/light tough Pain Descriptors / Indicators: Discomfort, Grimacing, Guarding, Crying Pain Intervention(s): Repositioned, Patient requesting pain meds-RN notified, Premedicated before session, Monitored during session, Limited activity within patient's tolerance    Home Living                          Prior Function            PT Goals (current goals can now be found in the care plan section) Acute Rehab PT Goals Patient Stated Goal: decreased pain, get better PT Goal Formulation: With  patient Time For Goal Achievement: 02/01/23 Potential to Achieve Goals: Good Additional Goals Additional Goal #1: Pt will propel w/c x 150 ft with supervision to increase independence with mobility. Progress towards PT goals: Progressing toward goals    Frequency    7X/week      PT Plan      Co-evaluation              AM-PAC PT "6 Clicks" Mobility   Outcome Measure  Help needed turning from your back to your side while in a flat bed without using bedrails?: A Little Help needed moving from lying on your back to sitting on the side of a flat bed without using bedrails?: A Little Help needed moving to and from a bed to a chair (including a wheelchair)?: A Lot Help needed standing up from a chair using your arms (e.g., wheelchair or bedside chair)?: Total Help needed to walk in hospital room?: Total Help needed climbing 3-5 steps with a railing? : Total 6 Click Score: 11    End of Session Equipment Utilized During Treatment: Gait belt;Other (comment) (R sling) Activity Tolerance: Patient limited by pain Patient left: in chair;with chair alarm set;with call bell/phone within reach Nurse Communication: Mobility status PT Visit Diagnosis: Pain;Other abnormalities of gait and mobility (R26.89);Difficulty in walking, not elsewhere classified (R26.2);Muscle weakness (generalized) (M62.81) Pain - Right/Left: Right Pain - part of body: Shoulder     Time: 0272-5366 PT Time Calculation (min) (ACUTE ONLY): 23 min  Charges:    $Therapeutic Activity: 23-37 mins PT General Charges $$ ACUTE PT VISIT: 1 Visit                     Hortencia Conradi, PTA  01/19/23, 12:30 PM

## 2023-01-19 NOTE — Progress Notes (Signed)
PROGRESS NOTE    Tempie Delapuente   ZOX:[id number] DOB: [birth year]  DOA: 01/14/2023 Date of Service: 01/19/23 PCP: Margit Hanks, MD     Brief Narrative / Hospital Course:  Brittnye Hylton is a 67 y.o. female with medical history significant for 67 year old female with a history of hypertension, alcohol use disorder and nicotine dependence who presents to the ED via EMS following a fall onto her right side while walking her dogs, after her dogs ran into another dog.  She landed on her right shoulder with immediate pain of the right shoulder and twisted her ankle and has been unable to walk since the incident.  She hit her head but denies loss of consciousness.  07/22: To ED and admitted to hospitalist service for treatment closed right trimalleolar fracture and right proximal humerus fracture. 07/23: Podiatry and orthopedics evaluated the patient.  Podiatry planning for surgery 07/24: Surgical intervention pending 07/25: ORIF this afternoon 07/26-07/27: awaiting placement for SNF  Consultants:  Orthopedics Podiatry  Procedures: 01/17/23 ORIF and application of external fixator for R trimalleolar ankle fracture      ASSESSMENT & PLAN:   Principal Problem:   Closed right trimalleolar fracture, initial encounter Active Problems:   Closed fracture of right proximal humerus   Alcohol use disorder   Alcohol intoxication (HCC)   Hypokalemia   Anemia, mild   Benign essential HTN   Malnutrition of moderate degree   Closed right trimalleolar fracture, initial encounter S/p fracture reduction in the ED POD1 s/p ORIF and external fixation application  Pain control  PT/OT as able SNF rehab pending placement    Closed fracture of right proximal humerus Orthopedics, Dr. Signa Kell consulted Maintain sling as recommended from the ED Not planning any surgical intervention - confirmed today w/ ortho  continue NWB and pain control  Alcohol use disorder Alcohol intoxication -  resolved  EtOH level was 214 on admission  CIWA withdrawal protocol Folic acid, thiamine  Hypokalemia Replace as needed Monitor BMP   Anemia, mild Hemoglobin 10.8 near baseline of 11.1 Monitor periodic CBC   Benign essential HTN Normotensive to low BP Hold antihypertensives  Malnutrition, moderate Dietary following Electrolytes monitored closely   DVT prophylaxis: SCD Pertinent IV fluids/nutrition: No continuous IV fluids Central lines / invasive devices: Foley catheter in place d/t immobility,  Code Status: Full code ACP documentation reviewed: Reviewed 01/16/2023, none on file  Current Admission Status: Inpatient TOC needs / Dispo plan: SNF rehab Barriers to discharge / significant pending items: placement             Subjective / Brief ROS:  Resting comfortably , no concerns    Family Communication: none at this time    Objective Findings:  Vitals:   01/18/23 1553 01/18/23 2322 01/19/23 0719 01/19/23 0829  BP: 115/88 119/76  128/81  Pulse: 93 77  66  Resp: 16 18  16   Temp: 98.5 F (36.9 C) 98.1 F (36.7 C)  97.7 F (36.5 C)  TempSrc:      SpO2: 97% 98%  94%  Weight:   55 kg   Height:        Intake/Output Summary (Last 24 hours) at 01/19/2023 1447 Last data filed at 01/19/2023 1300 Gross per 24 hour  Intake 120 ml  Output 1300 ml  Net -1180 ml   Filed Weights   01/17/23 0500 01/17/23 1447 01/19/23 0719  Weight: 56.3 kg 56.3 kg 55 kg    Examination:  Physical Exam Constitutional:  General: She is not in acute distress. Cardiovascular:     Rate and Rhythm: Normal rate and regular rhythm.  Pulmonary:     Effort: Pulmonary effort is normal.     Breath sounds: Normal breath sounds.          Scheduled Medications:   Chlorhexidine Gluconate Cloth  6 each Topical Q0600   feeding supplement  1 Container Oral TID BM   folic acid  1 mg Oral Daily   mupirocin ointment  1 Application Nasal BID   nicotine  7 mg Transdermal  Daily   thiamine  100 mg Oral Daily   Or   thiamine  100 mg Intravenous Daily    Continuous Infusions:   PRN Medications:  morphine injection, oxyCODONE-acetaminophen, senna-docusate  Antimicrobials from admission:  Anti-infectives (From admission, onward)    None           Data Reviewed:  I have personally reviewed the following...  CBC: Recent Labs  Lab 01/14/23 1840 01/15/23 1404 01/16/23 0511 01/18/23 0332  WBC 13.8* 8.4 7.5 8.0  NEUTROABS 10.3* 6.1 4.8  --   HGB 10.8* 9.4* 9.2* 8.1*  HCT 32.8* 29.3* 27.5* 25.0*  MCV 98.8 101.0* 99.6 100.8*  PLT 483* 375 351 337   Basic Metabolic Panel: Recent Labs  Lab 01/14/23 1840 01/15/23 1404 01/16/23 0511 01/17/23 0638 01/18/23 0332  NA 138 135 135  --  136  K 3.3* 3.2* 4.4 3.7 3.5  CL 106 106 108  --  103  CO2 19* 23 22  --  27  GLUCOSE 104* 146* 100*  --  213*  BUN 10 13 11   --  14  CREATININE 0.74 0.73 0.53  --  0.48  CALCIUM 8.2* 7.9* 8.0*  --  8.2*  MG  --   --  2.3 2.1 2.1  PHOS  --   --  2.0* 4.1 3.6   GFR: Estimated Creatinine Clearance: 56.4 mL/min (by C-G formula based on SCr of 0.48 mg/dL). Liver Function Tests: Recent Labs  Lab 01/14/23 1840  AST 30  ALT 21  ALKPHOS 56  BILITOT 0.3  PROT 6.7  ALBUMIN 4.1   No results for input(s): "LIPASE", "AMYLASE" in the last 168 hours. No results for input(s): "AMMONIA" in the last 168 hours. Coagulation Profile: Recent Labs  Lab 01/14/23 1856  INR 1.2   Cardiac Enzymes: No results for input(s): "CKTOTAL", "CKMB", "CKMBINDEX", "TROPONINI" in the last 168 hours. BNP (last 3 results) No results for input(s): "PROBNP" in the last 8760 hours. HbA1C: No results for input(s): "HGBA1C" in the last 72 hours. CBG: Recent Labs  Lab 01/14/23 1852  GLUCAP 97   Lipid Profile: No results for input(s): "CHOL", "HDL", "LDLCALC", "TRIG", "CHOLHDL", "LDLDIRECT" in the last 72 hours. Thyroid Function Tests: No results for input(s): "TSH",  "T4TOTAL", "FREET4", "T3FREE", "THYROIDAB" in the last 72 hours. Anemia Panel: No results for input(s): "VITAMINB12", "FOLATE", "FERRITIN", "TIBC", "IRON", "RETICCTPCT" in the last 72 hours. Most Recent Urinalysis On File:     Component Value Date/Time   COLORURINE STRAW (A) 08/08/2021 0610   APPEARANCEUR CLEAR (A) 08/08/2021 0610   APPEARANCEUR Clear 01/06/2014 0909   LABSPEC 1.003 (L) 08/08/2021 0610   LABSPEC 1.017 01/06/2014 0909   PHURINE 5.0 08/08/2021 0610   GLUCOSEU NEGATIVE 08/08/2021 0610   GLUCOSEU Negative 01/06/2014 0909   HGBUR NEGATIVE 08/08/2021 0610   BILIRUBINUR NEGATIVE 08/08/2021 0610   BILIRUBINUR Negative 01/06/2014 0909   KETONESUR NEGATIVE 08/08/2021 0610   PROTEINUR  NEGATIVE 08/08/2021 0610   NITRITE NEGATIVE 08/08/2021 0610   LEUKOCYTESUR NEGATIVE 08/08/2021 0610   LEUKOCYTESUR 2+ 01/06/2014 0909   Sepsis Labs: @LABRCNTIP (procalcitonin:4,lacticidven:4) Microbiology: Recent Results (from the past 240 hour(s))  Surgical PCR screen     Status: None   Collection Time: 01/15/23  5:19 AM   Specimen: Nasal Mucosa; Nasal Swab  Result Value Ref Range Status   MRSA, PCR NEGATIVE NEGATIVE Final   Staphylococcus aureus NEGATIVE NEGATIVE Final    Comment: (NOTE) The Xpert SA Assay (FDA approved for NASAL specimens in patients 53 years of age and older), is one component of a comprehensive surveillance program. It is not intended to diagnose infection nor to guide or monitor treatment. Performed at [facility], [address] [place], [place] [address]       Radiology Studies last 3 days: DG Foot Complete Right  Result Date: 01/17/2023 CLINICAL DATA:  Postoperative EXAM: RIGHT FOOT COMPLETE - 3+ VIEW COMPARISON:  None Available. FINDINGS: Orthopedic hardware is seen in the posterior talocalcaneal region and ankle with skin staples. No other fracture or dislocation identified. Joint spaces are maintained. Soft tissues are within normal limits.  IMPRESSION: Orthopedic hardware is seen in the posterior talocalcaneal region and ankle with skin staples. Electronically Signed   By: Darliss Cheney M.D.   On: 01/17/2023 21:51   DG Ankle Complete Right  Result Date: 01/17/2023 CLINICAL DATA:  Postop check EXAM: RIGHT ANKLE - COMPLETE 3+ VIEW COMPARISON:  01/14/2024 FINDINGS: Internal fixation with plate and screw fixation across the medial and lateral malleolar fractures. Posterior malleolar fracture also noted, minimally displaced. External fixator partially visualized. IMPRESSION: Internal and external fixation as above. No visible complicating feature. Electronically Signed   By: Charlett Nose M.D.   On: 01/17/2023 21:08   DG Tibia/Fibula Right  Result Date: 01/17/2023 CLINICAL DATA:  Postop check EXAM: RIGHT TIBIA AND FIBULA - 2 VIEW COMPARISON:  01/14/2023 FINDINGS: Plate and screw fixation noted in the distal tibia and fibula. External fixer device in place. Anatomic alignment. No visible complicating feature. IMPRESSION: Internal and external fixation across the medial and lateral malleolar fractures. No visible complicating feature. Electronically Signed   By: Charlett Nose M.D.   On: 01/17/2023 21:08   DG MINI C-ARM IMAGE ONLY  Result Date: 01/17/2023 There is no interpretation for this exam.  This order is for images obtained during a surgical procedure.  Please See "Surgeries" Tab for more information regarding the procedure.   [place] OR NERVE BLOCK-IMAGE ONLY [facility])  Result Date: 01/17/2023 There is no interpretation for this exam.  This order is for images obtained during a surgical procedure.  Please See "Surgeries" Tab for more information regarding the procedure.             LOS: 5 days      Sunnie Nielsen, DO [facility] 01/19/2023, 2:47 PM    Dictation software may have been used to generate the above note. Typos may occur and escape review in typed/dictated notes. Please contact Dr Lyn Hollingshead directly for  clarity if needed.  Staff may message me via secure chat in Epic  but this may not receive an immediate response,  please page me for urgent matters!  If 7PM-7AM, please contact night coverage [email or web address]

## 2023-01-19 NOTE — Plan of Care (Signed)

## 2023-01-19 NOTE — TOC Progression Note (Signed)
Transition of Care [facility]) - Progression Note    Patient Details  Name: Becky Ward MRN: [id number] Date of Birth: 1955/12/04  Transition of [facility]) CM/SW Contact  Kemper Durie, RN Phone Number: 01/19/2023, 3:58 PM  Clinical Narrative:     Spoke with patient, aware of recommendations for SNF for short term rehab. State she does not prefer to go to SNF, report she has a brother and/or family friend (granddaughter's other grandmother) that may be available to provide support and supervision in the home.  She will discuss with family and notify the [facility] team of decision.  Expected Discharge Plan: Home w Home Health Services Barriers to Discharge: Continued Medical Work up  Expected Discharge Plan and Services       Living arrangements for the past 2 months: Skilled Nursing Facility                                       Social Determinants of Health (SDOH) Interventions SDOH Screenings   Food Insecurity: Food Insecurity Present (01/15/2023)  Housing: High Risk (01/15/2023)  Transportation Needs: Unmet Transportation Needs (01/15/2023)  Utilities: At Risk (01/15/2023)  Financial Resource Strain: High Risk (06/08/2022)   Received from [facility], [facility]  Physical Activity: Inactive (06/08/2022)   Received from [facility], [facility]  Social Connections: Unknown (06/08/2022)   [facility] from [facility]  Recent Concern: Social Connections - Socially Isolated (06/08/2022)   Received from [facility]  Stress: Stress Concern Present (06/08/2022)   Received from [facility], [facility]  Tobacco Use: High Risk (01/17/2023)    Readmission Risk Interventions     No data to display

## 2023-01-20 DIAGNOSIS — S82851A Displaced trimalleolar fracture of right lower leg, initial encounter for closed fracture: Secondary | ICD-10-CM | POA: Diagnosis not present

## 2023-01-20 MED ORDER — METHOTREXATE SODIUM 2.5 MG PO TABS
20.0000 mg | ORAL_TABLET | ORAL | Status: DC
  Administered 2023-01-20: 20 mg via ORAL
  Filled 2023-01-20: qty 8

## 2023-01-20 MED ORDER — OXYCODONE-ACETAMINOPHEN 5-325 MG PO TABS
2.0000 | ORAL_TABLET | Freq: Four times a day (QID) | ORAL | Status: AC
  Administered 2023-01-20 – 2023-01-22 (×8): 2 via ORAL
  Filled 2023-01-20 (×8): qty 2

## 2023-01-20 MED ORDER — LATANOPROST 0.005 % OP SOLN
1.0000 [drp] | Freq: Every day | OPHTHALMIC | Status: DC
  Administered 2023-01-20 – 2023-01-23 (×4): 1 [drp] via OPHTHALMIC
  Filled 2023-01-20: qty 2.5

## 2023-01-20 MED ORDER — ADULT MULTIVITAMIN W/MINERALS CH
1.0000 | ORAL_TABLET | Freq: Every day | ORAL | Status: DC
  Administered 2023-01-20 – 2023-01-23 (×4): 1 via ORAL
  Filled 2023-01-20 (×4): qty 1

## 2023-01-20 MED ORDER — PRAVASTATIN SODIUM 20 MG PO TABS
20.0000 mg | ORAL_TABLET | Freq: Every day | ORAL | Status: DC
  Administered 2023-01-20 – 2023-01-23 (×4): 20 mg via ORAL
  Filled 2023-01-20 (×4): qty 1

## 2023-01-20 NOTE — Progress Notes (Signed)
PROGRESS NOTE    Becky Ward   KGM:[id number] DOB: July 04, 1955  DOA: 01/14/2023 Date of Service: 01/20/23 PCP: Margit Hanks, MD     Brief Narrative / Hospital Course:  Becky Ward is a 67 y.o. female with medical history significant for 67 year old female with a history of hypertension, alcohol use disorder and nicotine dependence who presents to the ED via EMS following a fall onto her right side while walking her dogs, after her dogs ran into another dog.  She landed on her right shoulder with immediate pain of the right shoulder and twisted her ankle and has been unable to walk since the incident.  She hit her head but denies loss of consciousness.  07/22: To ED and admitted to hospitalist service for treatment closed right trimalleolar fracture and right proximal humerus fracture. 07/23: Podiatry and orthopedics evaluated the patient.  Podiatry planning for surgery 07/24: Surgical intervention pending 07/25: ORIF this afternoon 07/26-07/28: awaiting placement for SNF (07/28 is Sunday)  Consultants:  Orthopedics Podiatry  Procedures: 01/17/23 ORIF and application of external fixator for R trimalleolar ankle fracture      ASSESSMENT & PLAN:   Principal Problem:   Closed right trimalleolar fracture, initial encounter Active Problems:   Closed fracture of right proximal humerus   Alcohol use disorder   Alcohol intoxication (HCC)   Hypokalemia   Anemia, mild   Benign essential HTN   Malnutrition of moderate degree   Closed right trimalleolar fracture, initial encounter S/p fracture reduction in the ED POD1 s/p ORIF and external fixation application  Pain control - scheduling po pain meds now to goal taper off IV  PT/OT as able SNF rehab pending placement    Closed fracture of right proximal humerus Orthopedics, Dr. Signa Kell evaluated and s/o Maintain sling as recommended from the ED Not planning any surgical intervention - confirmed w/ ortho  07/27 continue NWB and pain control  Alcohol use disorder Alcohol intoxication - resolved  EtOH level was 214 on admission  CIWA withdrawal protocol Folic acid, thiamine  Hypokalemia Replace as needed Monitor BMP   Anemia, mild Hemoglobin 10.8 near baseline of 11.1 Monitor periodic CBC   Benign essential HTN Normotensive to low BP Hold antihypertensives  Malnutrition, moderate Dietary following Electrolytes monitored, periodic BMP  Arthritis question RA Pt on methotrexate outpatient Resumed    DVT prophylaxis: SCD Pertinent IV fluids/nutrition: No continuous IV fluids Central lines / invasive devices: PureWick in place d/t immobility,  Code Status: Full code ACP documentation reviewed: Reviewed 01/16/2023, none on file  Current Admission Status: Inpatient TOC needs / Dispo plan: SNF rehab Barriers to discharge / significant pending items: placement             Subjective / Brief ROS:  Concerned about not getting her pain meds on time  Question re: resume methotrexate   Family Communication: none at this time    Objective Findings:  Vitals:   01/19/23 1647 01/19/23 2307 01/20/23 0500 01/20/23 0902  BP: 112/72 122/77  118/83  Pulse: 80 88  89  Resp: 17 18  18   Temp: 98.3 F (36.8 C) 98.5 F (36.9 C)  99.1 F (37.3 C)  TempSrc:      SpO2: 97% 98%  100%  Weight:   55 kg   Height:        Intake/Output Summary (Last 24 hours) at 01/20/2023 1243 Last data filed at 01/19/2023 1300 Gross per 24 hour  Intake 120 ml  Output --  Net 120 ml  Filed Weights   01/17/23 1447 01/19/23 0719 01/20/23 0500  Weight: 56.3 kg 55 kg 55 kg    Examination:  Physical Exam Constitutional:      General: She is not in acute distress.    Appearance: She is not ill-appearing.  Cardiovascular:     Rate and Rhythm: Normal rate and regular rhythm.  Pulmonary:     Effort: Pulmonary effort is normal.     Breath sounds: Normal breath sounds.  Skin:     General: Skin is warm and dry.  Neurological:     General: No focal deficit present.     Mental Status: She is alert and oriented to person, place, and time.          Scheduled Medications:   feeding supplement  1 Container Oral TID BM   folic acid  1 mg Oral Daily   latanoprost  1 drop Both Eyes QHS   methotrexate  2.5 mg Oral Weekly   Multi-Vitamin  1 tablet Oral Daily   nicotine  7 mg Transdermal Daily   oxyCODONE-acetaminophen  2 tablet Oral Q6H   pravastatin  20 mg Oral QHS   thiamine  100 mg Oral Daily   Or   thiamine  100 mg Intravenous Daily    Continuous Infusions:   PRN Medications:  morphine injection, senna-docusate  Antimicrobials from admission:  Anti-infectives (From admission, onward)    None           Data Reviewed:  I have personally reviewed the following...  CBC: Recent Labs  Lab 01/14/23 1840 01/15/23 1404 01/16/23 0511 01/18/23 0332  WBC 13.8* 8.4 7.5 8.0  NEUTROABS 10.3* 6.1 4.8  --   HGB 10.8* 9.4* 9.2* 8.1*  HCT 32.8* 29.3* 27.5* 25.0*  MCV 98.8 101.0* 99.6 100.8*  PLT 483* 375 351 337   Basic Metabolic Panel: Recent Labs  Lab 01/14/23 1840 01/15/23 1404 01/16/23 0511 01/17/23 0638 01/18/23 0332  NA 138 135 135  --  136  K 3.3* 3.2* 4.4 3.7 3.5  CL 106 106 108  --  103  CO2 19* 23 22  --  27  GLUCOSE 104* 146* 100*  --  213*  BUN 10 13 11   --  14  CREATININE 0.74 0.73 0.53  --  0.48  CALCIUM 8.2* 7.9* 8.0*  --  8.2*  MG  --   --  2.3 2.1 2.1  PHOS  --   --  2.0* 4.1 3.6   GFR: Estimated Creatinine Clearance: 56.4 mL/min (by C-G formula based on SCr of 0.48 mg/dL). Liver Function Tests: Recent Labs  Lab 01/14/23 1840  AST 30  ALT 21  ALKPHOS 56  BILITOT 0.3  PROT 6.7  ALBUMIN 4.1   No results for input(s): "LIPASE", "AMYLASE" in the last 168 hours. No results for input(s): "AMMONIA" in the last 168 hours. Coagulation Profile: Recent Labs  Lab 01/14/23 1856  INR 1.2   Cardiac Enzymes: No  results for input(s): "CKTOTAL", "CKMB", "CKMBINDEX", "TROPONINI" in the last 168 hours. BNP (last 3 results) No results for input(s): "PROBNP" in the last 8760 hours. HbA1C: No results for input(s): "HGBA1C" in the last 72 hours. CBG: Recent Labs  Lab 01/14/23 1852  GLUCAP 97   Lipid Profile: No results for input(s): "CHOL", "HDL", "LDLCALC", "TRIG", "CHOLHDL", "LDLDIRECT" in the last 72 hours. Thyroid Function Tests: No results for input(s): "TSH", "T4TOTAL", "FREET4", "T3FREE", "THYROIDAB" in the last 72 hours. Anemia Panel: No results for input(s): "VITAMINB12", "FOLATE", "  FERRITIN", "TIBC", "IRON", "RETICCTPCT" in the last 72 hours. Most Recent Urinalysis On File:     Component Value Date/Time   COLORURINE STRAW (A) 08/08/2021 0610   APPEARANCEUR CLEAR (A) 08/08/2021 0610   APPEARANCEUR Clear 01/06/2014 0909   LABSPEC 1.003 (L) 08/08/2021 0610   LABSPEC 1.017 01/06/2014 0909   PHURINE 5.0 08/08/2021 0610   GLUCOSEU NEGATIVE 08/08/2021 0610   GLUCOSEU Negative 01/06/2014 0909   HGBUR NEGATIVE 08/08/2021 0610   BILIRUBINUR NEGATIVE 08/08/2021 0610   BILIRUBINUR Negative 01/06/2014 0909   KETONESUR NEGATIVE 08/08/2021 0610   PROTEINUR NEGATIVE 08/08/2021 0610   NITRITE NEGATIVE 08/08/2021 0610   LEUKOCYTESUR NEGATIVE 08/08/2021 0610   LEUKOCYTESUR 2+ 01/06/2014 0909   Sepsis Labs: @LABRCNTIP (procalcitonin:4,lacticidven:4) Microbiology: Recent Results (from the past 240 hour(s))  Surgical PCR screen     Status: None   Collection Time: 01/15/23  5:19 AM   Specimen: Nasal Mucosa; Nasal Swab  Result Value Ref Range Status   MRSA, PCR NEGATIVE NEGATIVE Final   Staphylococcus aureus NEGATIVE NEGATIVE Final    Comment: (NOTE) The Xpert SA Assay (FDA approved for NASAL specimens in patients 35 years of age and older), is one component of a comprehensive surveillance program. It is not intended to diagnose infection nor to guide or monitor treatment. Performed at [facility], [address], [place], [place] [address]       Radiology Studies last 3 days: DG Foot Complete Right  Result Date: 01/17/2023 CLINICAL DATA:  Postoperative EXAM: RIGHT FOOT COMPLETE - 3+ VIEW COMPARISON:  None Available. FINDINGS: Orthopedic hardware is seen in the posterior talocalcaneal region and ankle with skin staples. No other fracture or dislocation identified. Joint spaces are maintained. Soft tissues are within normal limits. IMPRESSION: Orthopedic hardware is seen in the posterior talocalcaneal region and ankle with skin staples. Electronically Signed   By: Darliss Cheney M.D.   On: 01/17/2023 21:51   DG Ankle Complete Right  Result Date: 01/17/2023 CLINICAL DATA:  Postop check EXAM: RIGHT ANKLE - COMPLETE 3+ VIEW COMPARISON:  01/14/2024 FINDINGS: Internal fixation with plate and screw fixation across the medial and lateral malleolar fractures. Posterior malleolar fracture also noted, minimally displaced. External fixator partially visualized. IMPRESSION: Internal and external fixation as above. No visible complicating feature. Electronically Signed   By: Charlett Nose M.D.   On: 01/17/2023 21:08   DG Tibia/Fibula Right  Result Date: 01/17/2023 CLINICAL DATA:  Postop check EXAM: RIGHT TIBIA AND FIBULA - 2 VIEW COMPARISON:  01/14/2023 FINDINGS: Plate and screw fixation noted in the distal tibia and fibula. External fixer device in place. Anatomic alignment. No visible complicating feature. IMPRESSION: Internal and external fixation across the medial and lateral malleolar fractures. No visible complicating feature. Electronically Signed   By: Charlett Nose M.D.   On: 01/17/2023 21:08   DG MINI C-ARM IMAGE ONLY  Result Date: 01/17/2023 There is no interpretation for this exam.  This order is for images obtained during a surgical procedure.  Please See "Surgeries" Tab for more information regarding the procedure.   [place] OR NERVE BLOCK-IMAGE ONLY [facility])  Result Date:  01/17/2023 There is no interpretation for this exam.  This order is for images obtained during a surgical procedure.  Please See "Surgeries" Tab for more information regarding the procedure.             LOS: 6 days      Sunnie Nielsen, DO [facility] 01/20/2023, 12:43 PM    Dictation software may have been  used to generate the above note. Typos may occur and escape review in typed/dictated notes. Please contact Dr Lyn Hollingshead directly for clarity if needed.  Staff may message me via secure chat in Epic  but this may not receive an immediate response,  please page me for urgent matters!  If 7PM-7AM, please contact night coverage [email or web address]

## 2023-01-20 NOTE — Plan of Care (Signed)
  Problem: Clinical Measurements: Goal: Will remain free from infection Outcome: Progressing Goal: Cardiovascular complication will be avoided Outcome: Progressing   Problem: Activity: Goal: Risk for activity intolerance will decrease Outcome: Progressing   Problem: Nutrition: Goal: Adequate nutrition will be maintained Outcome: Progressing   

## 2023-01-20 NOTE — TOC Progression Note (Signed)
Transition of Care [facility]) - Progression Note    Patient Details  Name: Becky Ward MRN: [id number] Date of Birth: 10-21-1955  Transition of Care [facility]) CM/SW Contact  Kemper Durie, RN Phone Number: 01/20/2023, 3:21 PM  Clinical Narrative:     Discussed discharge planning with patient, she still does not want to pursue placement at this time.  State the family friend has offered to help, will discuss with her brother today.  Feels she will be fine going home if she is a little stronger, will continue to work with PT while inpatient.  Expected Discharge Plan: Home w Home Health Services Barriers to Discharge: Continued Medical Work up  Expected Discharge Plan and Services       Living arrangements for the past 2 months: Skilled Nursing Facility                                       Social Determinants of Health (SDOH) Interventions SDOH Screenings   Food Insecurity: Food Insecurity Present (01/15/2023)  Housing: High Risk (01/15/2023)  Transportation Needs: Unmet Transportation Needs (01/15/2023)  Utilities: At Risk (01/15/2023)  Financial Resource Strain: High Risk (06/08/2022)   Received from [facility], [facility]  Physical Activity: Inactive (06/08/2022)   Received from [facility], [facility]  Social Connections: Unknown (06/08/2022)   Received from [facility]  Recent Concern: Social Connections - Socially Isolated (06/08/2022)   Received from [facility]  Stress: Stress Concern Present (06/08/2022)   Received from [facility], [facility]  Tobacco Use: High Risk (01/17/2023)    Readmission Risk Interventions     No data to display

## 2023-01-20 NOTE — Plan of Care (Signed)

## 2023-01-20 NOTE — Progress Notes (Signed)
Occupational Therapy Treatment Patient Details Name: Becky Ward MRN: [id number] DOB: [birth year] Today's Date: 01/20/2023   History of present illness Pt is a 67 year old female admitted after a fall with Closed right trimalleolar fracture, closed fracture of R proximal humerus, now s/p  Open reduction internal fixation right ankle, Application of external fixator right on 01/17/23;  PMH significant for hypertension, alcohol use disorder and nicotine dependence, rheumatoid arthritis   OT comments  Chart reviewed, pt greeted in chair, requesting to void on bsc. Tx session targeted improving functional toilet transfers. Pt has been pre medicated. Pt continues to require significant physical assist for functional tranfers, MOD A via squat pivot to drop arm bsc, MAX A to the right back to chair. Pt is significantly guarding RUE, unable to tolerate elbow P/AROM. Slow progress is being made towards goals, discharge recommendation remains appropriate. OT will continue to follow.    Recommendations for follow up therapy are one component of a multi-disciplinary discharge planning process, led by the attending physician.  Recommendations may be updated based on patient status, additional functional criteria and insurance authorization.    Assistance Recommended at Discharge Intermittent Supervision/Assistance  Patient can return home with the following  A lot of help with bathing/dressing/bathroom;A little help with walking and/or transfers;Assistance with cooking/housework;Direct supervision/assist for medications management;Assist for transportation;Help with stairs or ramp for entrance   Equipment Recommendations   (drop arm bsc, mwc with one arm drive, elevating leg rest)    Recommendations for Other Services      Precautions / Restrictions Precautions Precautions: Fall Required Braces or Orthoses: Sling;Other Brace Other Brace: RLE external fixator Restrictions Weight Bearing Restrictions:  Yes RUE Weight Bearing: Non weight bearing RLE Weight Bearing: Non weight bearing Other Position/Activity Restrictions: WBing RUE in sling, may remove for dangle and hygiene, elbow/wrist, finger ROM ok; NWBing RLE in external fixator       Mobility Bed Mobility               General bed mobility comments: NT in recliner pre/post session    Transfers Overall transfer level: Needs assistance Equipment used: None Transfers: Bed to chair/wheelchair/BSC            Lateral/Scoot Transfers: Mod assist       Balance Overall balance assessment: Needs assistance Sitting-balance support: Feet supported, Single extremity supported Sitting balance-Leahy Scale: Good                                     ADL either performed or assessed with clinical judgement   ADL Overall ADL's : Needs assistance/impaired Eating/Feeding: Sitting;Set up Eating/Feeding Details (indicate cue type and reason): assist with opening packages                 Lower Body Dressing: Maximal assistance   Toilet Transfer: Moderate assistance;Cueing for sequencing;Maximal assistance Toilet Transfer Details (indicate cue type and reason): squat pivot, MOD A to the L to drop arm bsc, MAX A to the R to chair from bsc, step by step vcs throughout Toileting- Clothing Manipulation and Hygiene: Maximal assistance;Sitting/lateral lean              Extremity/Trunk Assessment Upper Extremity Assessment RUE Deficits / Details: pt continues to be unable to tolerate A/PROM to elbow, encouraged to attempt to decrease risk of contracture             Cognition Arousal/Alertness: Awake/alert Behavior During  Therapy: WFL for tasks assessed/performed Overall Cognitive Status: Within Functional Limits for tasks assessed                                          Exercises Other Exercises Other Exercises: edu pt re: importance of progressing mobility, RUE sling        General Comments vss throughout, ex fix, sling, intact pre/post session    Pertinent Vitals/ Pain       Pain Assessment Pain Assessment: 0-10 Pain Score: 9  Pain Location: RLE Pain Descriptors / Indicators: Discomfort, Grimacing, Guarding, Crying Pain Intervention(s): Monitored during session, Repositioned, Premedicated before session         Frequency  Min 1X/week        Progress Toward Goals  OT Goals(current goals can now be found in the care plan section)  Progress towards OT goals: Progressing toward goals     Plan Discharge plan remains appropriate       AM-PAC OT "6 Clicks" Daily Activity     Outcome Measure   Help from another person eating meals?: A Little Help from another person taking care of personal grooming?: A Little Help from another person toileting, which includes using toliet, bedpan, or urinal?: A Lot Help from another person bathing (including washing, rinsing, drying)?: A Lot Help from another person to put on and taking off regular upper body clothing?: A Lot Help from another person to put on and taking off regular lower body clothing?: A Lot 6 Click Score: 14    End of Session Equipment Utilized During Treatment: Other (comment) (sling, ex fix)  OT Visit Diagnosis: Other abnormalities of gait and mobility (R26.89);Unsteadiness on feet (R26.81)   Activity Tolerance Patient tolerated treatment well;Patient limited by pain   Patient Left in chair;with call bell/phone within reach;with chair alarm set   Nurse Communication Mobility status        Time: 1020-1035 OT Time Calculation (min): 15 min  Charges: OT General Charges $OT Visit: 1 Visit OT Treatments $Self Care/Home Management : 8-22 mins  Oleta Mouse, OTD OTR/L  01/20/23, 10:45 AM

## 2023-01-20 NOTE — Progress Notes (Signed)
Physical Therapy Treatment Patient Details Name: Becky Ward MRN: [id number] DOB: 1956/03/14 Today's Date: 01/20/2023   History of Present Illness Pt is a 67 year old female admitted after a fall with Closed right trimalleolar fracture, closed fracture of R proximal humerus, now s/p  Open reduction internal fixation right ankle, Application of external fixator right on 01/17/23;  PMH significant for hypertension, alcohol use disorder and nicotine dependence, rheumatoid arthritis    PT Comments  Ready to get up.  To EOB with min a x 1 for upper body.  Is able to manage LLE on her own with no assist.  She is steady in sitting and puts in max effort to laterally scoot to drop arm recliner but does need mod a x 1 to fully transition at end.  Remained in recliner after session with needs met.  Drop arm BSC is obtained and put in room.  OT to assist pt back to bed later this am.    Assistance Recommended at Discharge Frequent or constant Supervision/Assistance  If plan is discharge home, recommend the following:  Can travel by private vehicle    Two people to help with walking and/or transfers;Two people to help with bathing/dressing/bathroom;Help with stairs or ramp for entrance;Assist for transportation;Assistance with feeding;Assistance with Engineer, technical sales cushion (measurements PT) (w/c (possibly L one arm drive w/c), drop arm BSC)    Recommendations for Other Services       Precautions / Restrictions Precautions Precautions: Fall Required Braces or Orthoses: Sling;Other Brace Other Brace: RLE external fixator Restrictions Weight Bearing Restrictions: Yes RUE Weight Bearing: Non weight bearing RLE Weight Bearing: Non weight bearing     Mobility  Bed Mobility Overal bed mobility: Needs Assistance Bed Mobility: Supine to Sit     Supine to sit: Min assist     General bed mobility comments: some assist for upper body today Patient  Response: Cooperative  Transfers Overall transfer level: Needs assistance Equipment used: None Transfers: Bed to chair/wheelchair/BSC            Lateral/Scoot Transfers: Mod assist General transfer comment: able to scoot today with +1 but +2 to reposition in chair.  will need +2 back to bed due to increased height of bed.    Ambulation/Gait               General Gait Details: unable   Stairs             Wheelchair Mobility     Tilt Bed Tilt Bed Patient Response: Cooperative  Modified Rankin (Stroke Patients Only)       Balance Overall balance assessment: Needs assistance Sitting-balance support: Feet supported, Single extremity supported Sitting balance-Leahy Scale: Good         Standing balance comment: unable to stand                            Cognition Arousal/Alertness: Awake/alert Behavior During Therapy: WFL for tasks assessed/performed Overall Cognitive Status: Within Functional Limits for tasks assessed                                          Exercises      General Comments        Pertinent Vitals/Pain Pain Assessment Pain Assessment: Faces Faces Pain Scale: Hurts whole lot Pain Descriptors /  Indicators: Discomfort, Grimacing, Guarding, Crying Pain Intervention(s): Limited activity within patient's tolerance, Monitored during session, Repositioned, Ice applied, Premedicated before session    Home Living                          Prior Function            PT Goals (current goals can now be found in the care plan section) Progress towards PT goals: Progressing toward goals    Frequency    7X/week      PT Plan      Co-evaluation              AM-PAC PT "6 Clicks" Mobility   Outcome Measure  Help needed turning from your back to your side while in a flat bed without using bedrails?: A Little Help needed moving from lying on your back to sitting on the side of a flat  bed without using bedrails?: A Little Help needed moving to and from a bed to a chair (including a wheelchair)?: A Lot Help needed standing up from a chair using your arms (e.g., wheelchair or bedside chair)?: Total Help needed to walk in hospital room?: Total Help needed climbing 3-5 steps with a railing? : Total 6 Click Score: 11    End of Session Equipment Utilized During Treatment: Gait belt;Other (comment) (R sling) Activity Tolerance: Patient limited by pain Patient left: in chair;with chair alarm set;with call bell/phone within reach Nurse Communication: Mobility status PT Visit Diagnosis: Pain;Other abnormalities of gait and mobility (R26.89);Difficulty in walking, not elsewhere classified (R26.2);Muscle weakness (generalized) (M62.81) Pain - Right/Left: Right Pain - part of body: Shoulder     Time: 0981-1914 PT Time Calculation (min) (ACUTE ONLY): 22 min  Charges:    $Therapeutic Activity: 8-22 mins PT General Charges $$ ACUTE PT VISIT: 1 Visit                   Danielle Dess, PTA 01/20/23, 9:11 AM

## 2023-01-20 NOTE — Progress Notes (Signed)
Bone stimulator placed and turned on

## 2023-01-21 ENCOUNTER — Encounter: Payer: Self-pay | Admitting: Podiatry

## 2023-01-21 DIAGNOSIS — S82851A Displaced trimalleolar fracture of right lower leg, initial encounter for closed fracture: Secondary | ICD-10-CM | POA: Diagnosis not present

## 2023-01-21 MED ORDER — MORPHINE SULFATE (PF) 2 MG/ML IV SOLN
2.0000 mg | INTRAVENOUS | Status: DC | PRN
  Administered 2023-01-21 – 2023-01-22 (×4): 2 mg via INTRAVENOUS
  Filled 2023-01-21 (×4): qty 1

## 2023-01-21 NOTE — TOC Progression Note (Addendum)
Transition of Care [facility]) - Progression Note    Patient Details  Name: Becky Ward MRN: [id number] Date of Birth: 07-26-1955  [facility]) CM/SW Contact  Marlowe Sax, RN Phone Number: 01/21/2023, 1:22 PM  Clinical Narrative:    Met with the patient in the room with family She has changed her mind and wants to go home with [facility], She needs a Hospital bed, a Wheelchair, and a drop arm bed side commode, I notified [employer], they will process and deliver Her family will provide transport home Her brother will stay with her at night and friend during the day  I requested Barbara Cower with [facility] to accept for [facility] Awaiting a response from [facility]  Update [facility] accepted for [facility]  Expected Discharge Plan: Home w Home Health Services Barriers to Discharge: Continued Medical Work up  Expected Discharge Plan and Services       Living arrangements for the past 2 months: Skilled Nursing Facility                 DME Arranged: Hospital bed, Wheelchair manual, 3-N-1 DME Agency: [employer] Date DME Agency Contacted: 01/21/23 Time DME Agency Contacted: [phone] Representative spoke with at DME Agency: JON HH Arranged: PT, OT, Nurse's Aide           Social Determinants of Health (SDOH) Interventions SDOH Screenings   Food Insecurity: Food Insecurity Present (01/15/2023)  Housing: High Risk (01/15/2023)  Transportation Needs: Unmet Transportation Needs (01/15/2023)  Utilities: At Risk (01/15/2023)  Financial Resource Strain: High Risk (06/08/2022)   Received from [facility], [facility]  Physical Activity: Inactive (06/08/2022)   Received from [facility], [facility]  Social Connections: Unknown (06/08/2022)   Received from [facility]  Recent Concern: Social Connections - Socially Isolated (06/08/2022)   [facility] from [facility]  Stress: Stress Concern Present  (06/08/2022)   Received from [facility], [facility]  Tobacco Use: High Risk (01/17/2023)    Readmission Risk Interventions     No data to display

## 2023-01-21 NOTE — Progress Notes (Signed)
Occupational Therapy Treatment Patient Details Name: Becky Ward MRN: [id number] DOB: 1956-04-12 Today's Date: 01/21/2023   History of present illness Pt is a 67 year old female admitted after a fall with Closed right trimalleolar fracture, closed fracture of R proximal humerus, now s/p  Open reduction internal fixation right ankle, Application of external fixator right on 01/17/23;  PMH significant for hypertension, alcohol use disorder and nicotine dependence, rheumatoid arthritis   OT comments  Ms Lamoureux was seen for OT treatment on this date. Upon arrival to room pt reclined in bed, agreeable to tx. Pt requires no physical assist for bed mobility. MAX A for BSC t/f, difficulty maintaining RLE NWBing. SUPERVISION + SETUP pericare seated. MOD A don/doff sling seated. Educated on DME recs and sling mgmt, pt reports 10/10 pain with movement, deferred education on RUE ROM. Reports will have +1 assist upon return home. Pt making progress toward goals, will continue to follow POC. Discharge recommendation remains appropriate.     Recommendations for follow up therapy are one component of a multi-disciplinary discharge planning process, led by the attending physician.  Recommendations may be updated based on patient status, additional functional criteria and insurance authorization.    Assistance Recommended at Discharge Intermittent Supervision/Assistance  Patient can return home with the following  A lot of help with bathing/dressing/bathroom;A little help with walking and/or transfers;Assistance with cooking/housework;Direct supervision/assist for medications management;Assist for transportation;Help with stairs or ramp for entrance   Equipment Recommendations  Hospital bed;BSC/3in1 (drop arm BSC)    Recommendations for Other Services      Precautions / Restrictions Precautions Precautions: Fall Required Braces or Orthoses: Sling;Other Brace Other Brace: RLE external  fixator Restrictions Weight Bearing Restrictions: Yes RUE Weight Bearing: Non weight bearing RLE Weight Bearing: Non weight bearing Other Position/Activity Restrictions: WBing RUE in sling, may remove for dangle and hygiene, elbow/wrist, finger ROM ok; NWBing RLE in external fixator       Mobility Bed Mobility Overal bed mobility: Needs Assistance Bed Mobility: Supine to Sit, Sit to Supine     Supine to sit: Supervision Sit to supine: Supervision        Transfers Overall transfer level: Needs assistance Equipment used: None Transfers: Bed to chair/wheelchair/BSC            Lateral/Scoot Transfers: Mod assist       Balance Overall balance assessment: Needs assistance Sitting-balance support: Feet supported, Single extremity supported Sitting balance-Leahy Scale: Good                                     ADL either performed or assessed with clinical judgement   ADL Overall ADL's : Needs assistance/impaired                                       General ADL Comments: MAX A for BSC t/f. SUPERVISION + SETUP pericare seated. MOD A don/doff sling seated      Cognition Arousal/Alertness: Awake/alert Behavior During Therapy: WFL for tasks assessed/performed Overall Cognitive Status: Within Functional Limits for tasks assessed  Pertinent Vitals/ Pain       Pain Assessment Pain Assessment: 0-10 Pain Score: 10-Worst pain ever Pain Location: RLE and R shoulder Pain Descriptors / Indicators: Discomfort, Grimacing, Guarding Pain Intervention(s): Limited activity within patient's tolerance, Premedicated before session, Repositioned   Frequency  Min 1X/week        Progress Toward Goals  OT Goals(current goals can now be found in the care plan section)  Progress towards OT goals: Progressing toward goals  Acute Rehab OT Goals Patient Stated Goal: to go  home OT Goal Formulation: With patient Time For Goal Achievement: 02/01/23 Potential to Achieve Goals: Good ADL Goals Pt Will Perform Grooming: with modified independence;sitting Pt Will Perform Upper Body Dressing: with modified independence Pt Will Perform Lower Body Dressing: with modified independence Pt Will Transfer to Toilet: with modified independence;squat pivot transfer Pt Will Perform Toileting - Clothing Manipulation and hygiene: with modified independence;sitting/lateral leans  Plan Discharge plan remains appropriate    Co-evaluation                 AM-PAC OT "6 Clicks" Daily Activity     Outcome Measure   Help from another person eating meals?: A Little Help from another person taking care of personal grooming?: A Little Help from another person toileting, which includes using toliet, bedpan, or urinal?: A Little Help from another person bathing (including washing, rinsing, drying)?: A Lot Help from another person to put on and taking off regular upper body clothing?: A Lot Help from another person to put on and taking off regular lower body clothing?: A Lot 6 Click Score: 15    End of Session    OT Visit Diagnosis: Other abnormalities of gait and mobility (R26.89);Unsteadiness on feet (R26.81)   Activity Tolerance Patient tolerated treatment well;Patient limited by pain   Patient Left in bed;with call bell/phone within reach   Nurse Communication          Time: 1411-1435 OT Time Calculation (min): 24 min  Charges: OT General Charges $OT Visit: 1 Visit OT Treatments $Self Care/Home Management : 23-37 mins  Kathie Dike, M.S. OTR/L  01/21/23, 2:41 PM  ascom [phone]

## 2023-01-21 NOTE — Plan of Care (Signed)
  Problem: Education: Goal: Knowledge of General Education information will improve Description: Including pain rating scale, medication(s)/side effects and non-pharmacologic comfort measures Outcome: Progressing   Problem: Clinical Measurements: Goal: Ability to maintain clinical measurements within normal limits will improve Outcome: Progressing   Problem: Clinical Measurements: Goal: Will remain free from infection Outcome: Progressing   Problem: Activity: Goal: Risk for activity intolerance will decrease Outcome: Progressing   Problem: Nutrition: Goal: Adequate nutrition will be maintained Outcome: Progressing   Problem: Pain Managment: Goal: General experience of comfort will improve Outcome: Progressing   Problem: Safety: Goal: Ability to remain free from injury will improve Outcome: Progressing   Problem: Skin Integrity: Goal: Risk for impaired skin integrity will decrease Outcome: Progressing

## 2023-01-21 NOTE — Progress Notes (Signed)
PROGRESS NOTE    Becky Ward   RUE:[id number] DOB: 1955/11/05  DOA: 01/14/2023 Date of Service: 01/21/23 PCP: Margit Hanks, MD     Brief Narrative / Hospital Course:  Becky Ward is a 67 y.o. female with medical history significant for 67 year old female with a history of hypertension, alcohol use disorder and nicotine dependence who presents to the ED via EMS following a fall onto her right side while walking her dogs, after her dogs ran into another dog.  She landed on her right shoulder with immediate pain of the right shoulder and twisted her ankle and has been unable to walk since the incident.  She hit her head but denies loss of consciousness.  07/22: To ED and admitted to hospitalist service for treatment closed right trimalleolar fracture and right proximal humerus fracture. 07/23: Podiatry and orthopedics evaluated the patient.  Podiatry planning for surgery 07/24: Surgical intervention pending 07/25: ORIF this afternoon 07/26-07/28: awaiting placement for SNF (07/28 is Sunday) 07/29 (Monday): pt now would like to go home w/ HH, TOC working to arrange this.   Consultants:  Orthopedics Podiatry  Procedures: 01/17/23 ORIF and application of external fixator for R trimalleolar ankle fracture      ASSESSMENT & PLAN:   Principal Problem:   Closed right trimalleolar fracture, initial encounter Active Problems:   Closed fracture of right proximal humerus   Alcohol use disorder   Alcohol intoxication (HCC)   Hypokalemia   Anemia, mild   Benign essential HTN   Malnutrition of moderate degree   Closed right trimalleolar fracture, initial encounter S/p fracture reduction in the ED s/p ORIF and external fixation application w/ podiatry Dr Logan Bores  Pain control - IV rx d/c PT/OT as able SNF rehab pending placement then pt decided for [facility]   Closed fracture of right proximal humerus Orthopedics, Dr. Signa Kell evaluated and s/o Maintain sling as recommended  from the ED Not planning any surgical intervention - confirmed w/ ortho 07/27 continue NWB and pain control Follow outpatient w/ ortho  Alcohol use disorder Alcohol intoxication - resolved  EtOH level was 214 on admission  CIWA withdrawal protocol Folic acid, thiamine  Hypokalemia Replace as needed Monitor BMP   Anemia, mild Hemoglobin 10.8 near baseline of 11.1 Monitor periodic CBC   Benign essential HTN Normotensive to low BP Hold antihypertensives  Malnutrition, moderate Dietary following Electrolytes monitored, periodic BMP  Arthritis question RA Pt on methotrexate outpatient Resumed    DVT prophylaxis: SCD Pertinent IV fluids/nutrition: No continuous IV fluids Central lines / invasive devices: PureWick in place d/t immobility,  Code Status: Full code ACP documentation reviewed: Reviewed 01/16/2023, none on file  Current Admission Status: Inpatient TOC needs / Dispo plan: SNF rehab was in process, now working on [facility] Barriers to discharge / significant pending items: HH/DME delivery              Subjective / Brief ROS:  Resting comfortably, no concerns   Family Communication: none at this time    Objective Findings:  Vitals:   01/20/23 2348 01/21/23 0500 01/21/23 0732 01/21/23 1514  BP: 119/69  114/69 111/73  Pulse: 79  67 82  Resp: 20  14 14   Temp: 98.9 F (37.2 C)  98.3 F (36.8 C) 98.3 F (36.8 C)  TempSrc:      SpO2: 95%  100% 90%  Weight:  59.8 kg    Height:        Intake/Output Summary (Last 24 hours) at 01/21/2023 1632 Last data  filed at 01/21/2023 1618 Gross per 24 hour  Intake 240 ml  Output --  Net 240 ml   Filed Weights   01/19/23 0719 01/20/23 0500 01/21/23 0500  Weight: 55 kg 55 kg 59.8 kg    Examination:  Physical Exam Constitutional:      General: She is not in acute distress.    Appearance: She is not ill-appearing.  Pulmonary:     Effort: Pulmonary effort is normal.  Neurological:     Mental Status: She  is alert.          Scheduled Medications:   feeding supplement  1 Container Oral TID BM   folic acid  1 mg Oral Daily   latanoprost  1 drop Both Eyes QHS   methotrexate  20 mg Oral Weekly   multivitamin with minerals  1 tablet Oral Daily   nicotine  7 mg Transdermal Daily   oxyCODONE-acetaminophen  2 tablet Oral Q6H   pravastatin  20 mg Oral QHS   thiamine  100 mg Oral Daily   Or   thiamine  100 mg Intravenous Daily    Continuous Infusions:   PRN Medications:  senna-docusate  Antimicrobials from admission:  Anti-infectives (From admission, onward)    None           Data Reviewed:  I have personally reviewed the following...  CBC: Recent Labs  Lab 01/14/23 1840 01/15/23 1404 01/16/23 0511 01/18/23 0332  WBC 13.8* 8.4 7.5 8.0  NEUTROABS 10.3* 6.1 4.8  --   HGB 10.8* 9.4* 9.2* 8.1*  HCT 32.8* 29.3* 27.5* 25.0*  MCV 98.8 101.0* 99.6 100.8*  PLT 483* 375 351 337   Basic Metabolic Panel: Recent Labs  Lab 01/14/23 1840 01/15/23 1404 01/16/23 0511 01/17/23 0638 01/18/23 0332  NA 138 135 135  --  136  K 3.3* 3.2* 4.4 3.7 3.5  CL 106 106 108  --  103  CO2 19* 23 22  --  27  GLUCOSE 104* 146* 100*  --  213*  BUN 10 13 11   --  14  CREATININE 0.74 0.73 0.53  --  0.48  CALCIUM 8.2* 7.9* 8.0*  --  8.2*  MG  --   --  2.3 2.1 2.1  PHOS  --   --  2.0* 4.1 3.6   GFR: Estimated Creatinine Clearance: 56.4 mL/min (by C-G formula based on SCr of 0.48 mg/dL). Liver Function Tests: Recent Labs  Lab 01/14/23 1840  AST 30  ALT 21  ALKPHOS 56  BILITOT 0.3  PROT 6.7  ALBUMIN 4.1   No results for input(s): "LIPASE", "AMYLASE" in the last 168 hours. No results for input(s): "AMMONIA" in the last 168 hours. Coagulation Profile: Recent Labs  Lab 01/14/23 1856  INR 1.2   Cardiac Enzymes: No results for input(s): "CKTOTAL", "CKMB", "CKMBINDEX", "TROPONINI" in the last 168 hours. BNP (last 3 results) No results for input(s): "PROBNP" in the last 8760  hours. HbA1C: No results for input(s): "HGBA1C" in the last 72 hours. CBG: Recent Labs  Lab 01/14/23 1852  GLUCAP 97   Lipid Profile: No results for input(s): "CHOL", "HDL", "LDLCALC", "TRIG", "CHOLHDL", "LDLDIRECT" in the last 72 hours. Thyroid Function Tests: No results for input(s): "TSH", "T4TOTAL", "FREET4", "T3FREE", "THYROIDAB" in the last 72 hours. Anemia Panel: No results for input(s): "VITAMINB12", "FOLATE", "FERRITIN", "TIBC", "IRON", "RETICCTPCT" in the last 72 hours. Most Recent Urinalysis On File:     Component Value Date/Time   COLORURINE STRAW (A) 08/08/2021 6433  APPEARANCEUR CLEAR (A) 08/08/2021 0610   APPEARANCEUR Clear 01/06/2014 0909   LABSPEC 1.003 (L) 08/08/2021 0610   LABSPEC 1.017 01/06/2014 0909   PHURINE 5.0 08/08/2021 0610   GLUCOSEU NEGATIVE 08/08/2021 0610   GLUCOSEU Negative 01/06/2014 0909   HGBUR NEGATIVE 08/08/2021 0610   BILIRUBINUR NEGATIVE 08/08/2021 0610   BILIRUBINUR Negative 01/06/2014 0909   KETONESUR NEGATIVE 08/08/2021 0610   PROTEINUR NEGATIVE 08/08/2021 0610   NITRITE NEGATIVE 08/08/2021 0610   LEUKOCYTESUR NEGATIVE 08/08/2021 0610   LEUKOCYTESUR 2+ 01/06/2014 0909   Sepsis Labs: @LABRCNTIP (procalcitonin:4,lacticidven:4) Microbiology: Recent Results (from the past 240 hour(s))  Surgical PCR screen     Status: None   Collection Time: 01/15/23  5:19 AM   Specimen: Nasal Mucosa; Nasal Swab  Result Value Ref Range Status   MRSA, PCR NEGATIVE NEGATIVE Final   Staphylococcus aureus NEGATIVE NEGATIVE Final    Comment: (NOTE) The Xpert SA Assay (FDA approved for NASAL specimens in patients 90 years of age and older), is one component of a comprehensive surveillance program. It is not intended to diagnose infection nor to guide or monitor treatment. Performed at [facility], [address], [place], [place] [address]       Radiology Studies last 3 days: DG Foot Complete Right  Result Date: 01/17/2023 CLINICAL  DATA:  Postoperative EXAM: RIGHT FOOT COMPLETE - 3+ VIEW COMPARISON:  None Available. FINDINGS: Orthopedic hardware is seen in the posterior talocalcaneal region and ankle with skin staples. No other fracture or dislocation identified. Joint spaces are maintained. Soft tissues are within normal limits. IMPRESSION: Orthopedic hardware is seen in the posterior talocalcaneal region and ankle with skin staples. Electronically Signed   By: Darliss Cheney M.D.   On: 01/17/2023 21:51   DG Ankle Complete Right  Result Date: 01/17/2023 CLINICAL DATA:  Postop check EXAM: RIGHT ANKLE - COMPLETE 3+ VIEW COMPARISON:  01/14/2024 FINDINGS: Internal fixation with plate and screw fixation across the medial and lateral malleolar fractures. Posterior malleolar fracture also noted, minimally displaced. External fixator partially visualized. IMPRESSION: Internal and external fixation as above. No visible complicating feature. Electronically Signed   By: Charlett Nose M.D.   On: 01/17/2023 21:08   DG Tibia/Fibula Right  Result Date: 01/17/2023 CLINICAL DATA:  Postop check EXAM: RIGHT TIBIA AND FIBULA - 2 VIEW COMPARISON:  01/14/2023 FINDINGS: Plate and screw fixation noted in the distal tibia and fibula. External fixer device in place. Anatomic alignment. No visible complicating feature. IMPRESSION: Internal and external fixation across the medial and lateral malleolar fractures. No visible complicating feature. Electronically Signed   By: Charlett Nose M.D.   On: 01/17/2023 21:08             LOS: 7 days      Sunnie Nielsen, DO [facility] 01/21/2023, 4:32 PM    Dictation software may have been used to generate the above note. Typos may occur and escape review in typed/dictated notes. Please contact Dr Lyn Hollingshead directly for clarity if needed.  Staff may message me via secure chat in Epic  but this may not receive an immediate response,  please page me for urgent matters!  If 7PM-7AM, please  contact night coverage [email or web address]

## 2023-01-21 NOTE — Progress Notes (Signed)
Patient alert and oriented x4. Pain managed with scheduled and PRN meds. Good appetite during shift. Able to voice needs and wants effectively. Visitors present at bedside throughout the shift. Patient up OOB in chair with 2 person assist and tolerated well. Patient used bedside commode with 2 person assist. Meds whole with no discomfort. Currently in no distress at this time. Will report to next shift nurse.

## 2023-01-21 NOTE — Plan of Care (Signed)

## 2023-01-21 NOTE — Progress Notes (Signed)
Patient is not able to walk the distance required to go the bathroom, or he/she is unable to safely negotiate stairs required to access the bathroom.  A  drop arm 3in1 BSC will alleviate this problem

## 2023-01-21 NOTE — Progress Notes (Signed)
Patient has Closed right trimalleolar fracture, which requires lower body to be positioned in ways not feasible with a normal bed. Closed right trimalleolar fracture, and Pain frequently requires frequent changes in body position which cannot be achieved with a normal bed.

## 2023-01-21 NOTE — Progress Notes (Signed)
Physical Therapy Treatment Patient Details Name: Becky Ward MRN: [id number] DOB: 1955-08-29 Today's Date: 01/21/2023   History of Present Illness Pt is a 67 year old female admitted after a fall with Closed right trimalleolar fracture, closed fracture of R proximal humerus, now s/p  Open reduction internal fixation right ankle, Application of external fixator right on 01/17/23;  PMH significant for hypertension, alcohol use disorder and nicotine dependence, rheumatoid arthritis    PT Comments  Pt declining mobility this date.  Stated she is fearful of pain and needs to "get her head right" in regards to discharge planning.  Stated she is awaiting family to come in and discuss how much help they will be at home.  She plans to return home despite SNF recommendation.  She will need extensive equipment and help at home.   Hospital bed.  Preferred electric bed as she will be alone at home at times and will not be able to manage crank style bed on her own.  Pt aware that insurance may not pay for electric bed Wheelchair with elevating leg rests and cushion.   3.  Drop arm bedside commode.  She will benefit from [facility] therapies and nursing services as available to her.  She stated family/friend will be able to provide support but pt will be alone at night and at times during the day.  She will need EMS or medical transport home as she is unable to transfer in/out of a car at this time.  She does have stairs to enter home but stated family will be able to carry her up in a wheelchair.  A portable ramp would be a good option for family if they choose to do so.     Patient suffers from multiple NWB fractures which impairs his/her ability to perform daily activities like toileting, feeding, dressing, grooming, bathing in the home. A cane, walker, crutch will not resolve the patient's issue with performing activities of daily living. A lightweight wheelchair and cushion is required/recommended and will allow  patient to safely perform daily activities.   Patient can safely propel the wheelchair in the home or has a caregiver who can provide assistance.    Will discuss with team.   If plan is discharge home, recommend the following: Two people to help with walking and/or transfers;Help with stairs or ramp for entrance;Assist for transportation;Assistance with feeding;Assistance with cooking/housework   Can travel by private vehicle     No  Equipment Recommendations  Wheelchair cushion (measurements PT);BSC/3in1;Wheelchair (measurements PT);Hospital bed    Recommendations for Other Services       Precautions / Restrictions Precautions Precautions: Fall Required Braces or Orthoses: Sling;Other Brace Other Brace: RLE external fixator Restrictions Weight Bearing Restrictions: Yes RUE Weight Bearing: Non weight bearing RLE Weight Bearing: Non weight bearing Other Position/Activity Restrictions: WBing RUE in sling, may remove for dangle and hygiene, elbow/wrist, finger ROM ok; NWBing RLE in external fixator     Mobility  Bed Mobility                    Transfers                        Ambulation/Gait                   Stairs             Wheelchair Mobility     Tilt Bed    Modified Rankin (Stroke Patients Only)  Balance                                            Cognition Arousal/Alertness: Awake/alert Behavior During Therapy: WFL for tasks assessed/performed Overall Cognitive Status: Within Functional Limits for tasks assessed                                          Exercises      General Comments        Pertinent Vitals/Pain Pain Assessment Pain Assessment: PAINAD Pain Score: 7  Pain Location: RLE - fearful of moving and increasing pain Pain Descriptors / Indicators: Discomfort, Grimacing, Guarding Pain Intervention(s): Limited activity within patient's tolerance, Monitored during  session, Premedicated before session    Home Living                          Prior Function            PT Goals (current goals can now be found in the care plan section) Progress towards PT goals: Progressing toward goals    Frequency    7X/week      PT Plan Current plan remains appropriate;Other (comment)    Co-evaluation              AM-PAC PT "6 Clicks" Mobility   Outcome Measure  Help needed turning from your back to your side while in a flat bed without using bedrails?: A Little Help needed moving from lying on your back to sitting on the side of a flat bed without using bedrails?: A Little Help needed moving to and from a bed to a chair (including a wheelchair)?: A Lot Help needed standing up from a chair using your arms (e.g., wheelchair or bedside chair)?: Total Help needed to walk in hospital room?: Total Help needed climbing 3-5 steps with a railing? : Total 6 Click Score: 11    End of Session   Activity Tolerance: Patient limited by pain Patient left: in bed;with call bell/phone within reach;with bed alarm set Nurse Communication: Mobility status PT Visit Diagnosis: Pain;Other abnormalities of gait and mobility (R26.89);Difficulty in walking, not elsewhere classified (R26.2);Muscle weakness (generalized) (M62.81) Pain - Right/Left: Right Pain - part of body: Shoulder     Time: 1010-1022 PT Time Calculation (min) (ACUTE ONLY): 12 min  Charges:    $Therapeutic Activity: 8-22 mins PT General Charges $$ ACUTE PT VISIT: 1 Visit                   Danielle Dess, PTA 01/21/23, 11:16 AM

## 2023-01-22 DIAGNOSIS — S82851A Displaced trimalleolar fracture of right lower leg, initial encounter for closed fracture: Secondary | ICD-10-CM | POA: Diagnosis not present

## 2023-01-22 LAB — GLUCOSE, CAPILLARY
Glucose-Capillary: 118 mg/dL — ABNORMAL HIGH (ref 70–99)
Glucose-Capillary: 133 mg/dL — ABNORMAL HIGH (ref 70–99)
Glucose-Capillary: 125 mg/dL — ABNORMAL HIGH (ref 70–99)

## 2023-01-22 MED ORDER — MORPHINE SULFATE (PF) 2 MG/ML IV SOLN
2.0000 mg | INTRAVENOUS | Status: DC | PRN
  Administered 2023-01-22 – 2023-01-23 (×3): 2 mg via INTRAVENOUS
  Filled 2023-01-22 (×3): qty 1

## 2023-01-22 MED ORDER — OXYCODONE-ACETAMINOPHEN 5-325 MG PO TABS: 1.0000 | ORAL_TABLET | Freq: Four times a day (QID) | ORAL | 0 refills | Status: AC | PRN

## 2023-01-22 MED ORDER — INSULIN ASPART 100 UNIT/ML IJ SOLN
0.0000 [IU] | Freq: Three times a day (TID) | INTRAMUSCULAR | Status: DC
  Administered 2023-01-22: 1 [IU] via SUBCUTANEOUS
  Administered 2023-01-23: 2 [IU] via SUBCUTANEOUS
  Filled 2023-01-22 (×2): qty 1

## 2023-01-22 NOTE — TOC Progression Note (Signed)
Transition of Care [facility]) - Progression Note    Patient Details  Name: Becky Ward MRN: [id number] Date of Birth: [birth year]  [facility] CM/SW Contact  Marlowe Sax, RN Phone Number: 01/22/2023, 11:11 AM  Clinical Narrative:    The patient is set up to go home with family support WC was delivered yesterday by [employer] to the bedside   Expected Discharge Plan: Home w Home Health Services Barriers to Discharge: Continued Medical Work up  Expected Discharge Plan and Services       Living arrangements for the past 2 months: Skilled Nursing Facility                 DME Arranged: Hospital bed, Wheelchair manual, 3-N-1 DME Agency: [employer] Date DME Agency Contacted: 01/21/23 Time DME Agency Contacted: [phone] Representative spoke with at DME Agency: [facility] HH Arranged: PT, OT, Nurse's Aide           Social Determinants of Health (SDOH) Interventions SDOH Screenings   Food Insecurity: Food Insecurity Present (01/15/2023)  Housing: High Risk (01/15/2023)  Transportation Needs: Unmet Transportation Needs (01/15/2023)  Utilities: At Risk (01/15/2023)  Financial Resource Strain: High Risk (06/08/2022)   Received from [facility], [facility]  Physical Activity: Inactive (06/08/2022)   Received from [facility], [facility]  Social Connections: Unknown (06/08/2022)   Received from [facility]  Recent Concern: Social Connections - Socially Isolated (06/08/2022)   Received [facility]  Stress: Stress Concern Present (06/08/2022)   Received from [facility], [facility]  Tobacco Use: High Risk (01/17/2023)    Readmission Risk Interventions     No data to display

## 2023-01-22 NOTE — Care Management Important Message (Signed)
Important Message  Patient Details  Name: Becky Ward MRN: [id number] Date of Birth: 1956/05/07   Medicare Important Message Given:  Yes     [name] [name] 01/22/2023, 11:31 AM

## 2023-01-22 NOTE — Progress Notes (Signed)
Occupational Therapy Treatment Patient Details Name: Becky Ward MRN: [id number] DOB: [birth year] Today's Date: 01/22/2023   History of present illness Pt is a 67 year old female admitted after a fall with Closed right trimalleolar fracture, closed fracture of R proximal humerus, now s/p  Open reduction internal fixation right ankle, Application of external fixator right on 01/17/23;  PMH significant for hypertension, alcohol use disorder and nicotine dependence, rheumatoid arthritis   OT comments  [name] was seen for OT treatment on this date. Upon arrival to room pt supine in bed, agreeable to OT Tx session and endorsing desire for sponge bath. OT facilitated ADL management as described below. See ADL section for additional details regarding occupational performance. Pt continues to be functionally limited by increased pain, decreased functional use of her RLE/RUE (dominant), and decreased balance. She requires MOD A for squat pivot transfer to/from [facility] and MOD A for UB washing this date. Pt educated on compensatory ADL management strategies to maximize safety and functional independence. Pt return verbalizes understanding of education provided t/o session. Pt is progressing toward OT goals and continues to benefit from skilled OT services to maximize return to PLOF and minimize risk of future falls, injury, caregiver burden, and readmission. Will continue to follow POC as written. Discharge recommendation remains appropriate.     Recommendations for follow up therapy are one component of a multi-disciplinary discharge planning process, led by the attending physician.  Recommendations may be updated based on patient status, additional functional criteria and insurance authorization.    Assistance Recommended at Discharge Intermittent Supervision/Assistance  Patient can return home with the following  A lot of help with bathing/dressing/bathroom;A little help with walking and/or  transfers;Assistance with cooking/housework;Direct supervision/assist for medications management;Assist for transportation;Help with stairs or ramp for entrance   Equipment Recommendations  Hospital bed;BSC/3in1 (drop arm BSC)    Recommendations for Other Services      Precautions / Restrictions Precautions Precautions: Fall Required Braces or Orthoses: Sling;Other Brace Other Brace: RLE external fixator, RUE sling Restrictions Weight Bearing Restrictions: Yes RUE Weight Bearing: Non weight bearing RLE Weight Bearing: Non weight bearing Other Position/Activity Restrictions: RUE in sling, may remove for dangle and hygiene, elbow/wrist, finger ROM ok; NWBing RLE in external fixator       Mobility Bed Mobility Overal bed mobility: Modified Independent Bed Mobility: Supine to Sit, Sit to Supine     Supine to sit: Modified independent (Device/Increase time) Sit to supine: Modified independent (Device/Increase time)        Transfers Overall transfer level: Needs assistance Equipment used: 1 person hand held assist Transfers: Sit to/from Stand, Bed to chair/wheelchair/BSC Sit to Stand: Mod assist   Squat pivot transfers: Mod assist             Balance Overall balance assessment: Needs assistance Sitting-balance support: Feet supported, Single extremity supported Sitting balance-Leahy Scale: Good Sitting balance - Comments: steady with dynamic sitting balance during functional activity. Maintains RLE/RUE NWB t/o session.       Standing balance comment: unable to stand                           ADL either performed or assessed with clinical judgement   ADL Overall ADL's : Needs assistance/impaired Eating/Feeding: Sitting;Set up Eating/Feeding Details (indicate cue type and reason): assist with opening packages/two handed meal tray items Grooming: Wash/dry face;Sitting;Set up   Upper Body Bathing: Moderate assistance;Sitting Upper Body Bathing Details  (indicate cue type  and reason): requires assist for L underarm and back. Educated on compensatory strategies and equipment to maximize safety and independence. Lower Body Bathing: Sitting/lateral leans;Set up;Supervison/ safety Lower Body Bathing Details (indicate cue type and reason): No physical assist required during LB wash up while seated on BSC. Upper Body Dressing : Maximal assistance Upper Body Dressing Details (indicate cue type and reason): MAX A to doff/don sling and hospital gown. Educated on compensatory UB dressing strategies.       Toilet Transfer Details (indicate cue type and reason): squat pivot, MOD A to the L to drop arm bsc, step by step vcs throughout Toileting- Clothing Manipulation and Hygiene: Set up;Supervision/safety;Sitting/lateral lean         General ADL Comments: MOD A for squat pivot transfers    Extremity/Trunk Assessment              Vision Patient Visual Report: No change from baseline     Perception     Praxis      Cognition Arousal/Alertness: Awake/alert Behavior During Therapy: WFL for tasks assessed/performed Overall Cognitive Status: Within Functional Limits for tasks assessed                                          Exercises Other Exercises Other Exercises: OT facilitated ADL Management with education and assist as described above.    Shoulder Instructions       General Comments      Pertinent Vitals/ Pain       Pain Assessment Pain Assessment: 0-10 Pain Score: 10-Worst pain ever Pain Location: RLE and R shoulder Pain Descriptors / Indicators: Discomfort, Grimacing, Guarding Pain Intervention(s): Limited activity within patient's tolerance, Patient requesting pain meds-RN notified, Monitored during session, Repositioned, Utilized relaxation techniques  Home Living                                          Prior Functioning/Environment              Frequency  Min 1X/week         Progress Toward Goals  OT Goals(current goals can now be found in the care plan section)  Progress towards OT goals: Progressing toward goals  Acute Rehab OT Goals Patient Stated Goal: to go home OT Goal Formulation: With patient Time For Goal Achievement: 02/01/23 Potential to Achieve Goals: Good  Plan Discharge plan remains appropriate    Co-evaluation                 AM-PAC OT "6 Clicks" Daily Activity     Outcome Measure   Help from another person eating meals?: A Little Help from another person taking care of personal grooming?: A Little Help from another person toileting, which includes using toliet, bedpan, or urinal?: A Little Help from another person bathing (including washing, rinsing, drying)?: A Lot Help from another person to put on and taking off regular upper body clothing?: A Lot Help from another person to put on and taking off regular lower body clothing?: A Lot 6 Click Score: 15    End of Session    OT Visit Diagnosis: Other abnormalities of gait and mobility (R26.89);Unsteadiness on feet (R26.81)   Activity Tolerance Patient tolerated treatment well;Patient limited by pain   Patient Left in bed;with call bell/phone  within reach;with bed alarm set   Nurse Communication Mobility status        Time: 2952-8413 OT Time Calculation (min): 36 min  Charges: OT General Charges $OT Visit: 1 Visit OT Treatments $Self Care/Home Management : 23-37 mins  Rockney Ghee, M.S., OTR/L 01/22/23, 1:26 PM

## 2023-01-22 NOTE — Plan of Care (Signed)
  Problem: Activity: Goal: Risk for activity intolerance will decrease Outcome: Progressing   Problem: Coping: Goal: Level of anxiety will decrease Outcome: Progressing   Problem: Pain Managment: Goal: General experience of comfort will improve Outcome: Progressing   Problem: Safety: Goal: Ability to remain free from injury will improve Outcome: Progressing   Problem: Skin Integrity: Goal: Risk for impaired skin integrity will decrease Outcome: Progressing   

## 2023-01-22 NOTE — Progress Notes (Signed)
Physical Therapy Treatment Patient Details Name: Becky Ward MRN: [id number] DOB: [birth year] Today's Date: 01/22/2023   History of Present Illness Pt is a 67 year old female admitted after a fall with Closed right trimalleolar fracture, closed fracture of R proximal humerus, now s/p  Open reduction internal fixation right ankle, Application of external fixator right on 01/17/23;  PMH significant for hypertension, alcohol use disorder and nicotine dependence, rheumatoid arthritis    PT Comments  Pt is able to get to EOB with mod I.  She is able to stand x 2 holding onto sink base and shimmy foot left/right to simulate car transfer.  She is encouraged to roll down window for support.  She overall does well and is able to maintain NWB well on RUE./LE.  returns to supine without assist and repositioned for comfort. She continues to feel comfortable with discharge home.  Will adjust recommendations to assist with discharge planning.   If plan is discharge home, recommend the following: Help with stairs or ramp for entrance;Assist for transportation;Assistance with feeding;Assistance with cooking/housework;A lot of help with walking and/or transfers;A little help with bathing/dressing/bathroom   Can travel by private Theme park manager cushion (measurements PT);BSC/3in1;Wheelchair (measurements PT);Hospital bed    Recommendations for Other Services       Precautions / Restrictions Precautions Precautions: Fall Required Braces or Orthoses: Sling;Other Brace Other Brace: RLE external fixator Restrictions Weight Bearing Restrictions: Yes RUE Weight Bearing: Non weight bearing RLE Weight Bearing: Non weight bearing Other Position/Activity Restrictions: WBing RUE in sling, may remove for dangle and hygiene, elbow/wrist, finger ROM ok; NWBing RLE in external fixator     Mobility  Bed Mobility Overal bed mobility: Modified Independent Bed Mobility: Supine to  Sit, Sit to Supine     Supine to sit: Modified independent (Device/Increase time) Sit to supine: Modified independent (Device/Increase time)     Patient Response: Cooperative  Transfers Overall transfer level: Needs assistance Equipment used: 1 person hand held assist Transfers: Sit to/from Stand             General transfer comment: able to pull on sink base to stand and shimmy foot left/right to simulate car transfer (encouraged to roll window down for support in/out of car)    Ambulation/Gait Ambulation/Gait assistance: Min assist Gait Distance (Feet): 2 Feet Assistive device: 1 person hand held assist Gait Pattern/deviations: Step-to pattern Gait velocity: dec     General Gait Details: holds on to sink counter and shimmies foot left/rigth 2' left and right   Stairs             Wheelchair Mobility     Tilt Bed Tilt Bed Patient Response: Cooperative  Modified Rankin (Stroke Patients Only)       Balance Overall balance assessment: Needs assistance Sitting-balance support: Feet supported, Single extremity supported Sitting balance-Leahy Scale: Good                                      Cognition Arousal/Alertness: Awake/alert Behavior During Therapy: WFL for tasks assessed/performed Overall Cognitive Status: Within Functional Limits for tasks assessed                                          Exercises      General  Comments        Pertinent Vitals/Pain Pain Assessment Pain Assessment: Faces Faces Pain Scale: Hurts whole lot Pain Location: RLE and R shoulder Pain Descriptors / Indicators: Discomfort, Grimacing, Guarding Pain Intervention(s): Limited activity within patient's tolerance, Monitored during session, Premedicated before session, Repositioned    Home Living                          Prior Function            PT Goals (current goals can now be found in the care plan section)  Progress towards PT goals: Progressing toward goals    Frequency    7X/week      PT Plan Discharge plan needs to be updated    Co-evaluation              AM-PAC PT "6 Clicks" Mobility   Outcome Measure  Help needed turning from your back to your side while in a flat bed without using bedrails?: A Little Help needed moving from lying on your back to sitting on the side of a flat bed without using bedrails?: A Little Help needed moving to and from a bed to a chair (including a wheelchair)?: A Lot Help needed standing up from a chair using your arms (e.g., wheelchair or bedside chair)?: A Little Help needed to walk in hospital room?: A Lot Help needed climbing 3-5 steps with a railing? : Total 6 Click Score: 14    End of Session Equipment Utilized During Treatment: Gait belt Activity Tolerance: Patient tolerated treatment well Patient left: in bed;with call bell/phone within reach;with bed alarm set Nurse Communication: Mobility status PT Visit Diagnosis: Pain;Other abnormalities of gait and mobility (R26.89);Difficulty in walking, not elsewhere classified (R26.2);Muscle weakness (generalized) (M62.81) Pain - Right/Left: Right Pain - part of body: Shoulder     Time: 1610-9604 PT Time Calculation (min) (ACUTE ONLY): 29 min  Charges:    $Therapeutic Activity: 23-37 mins PT General Charges $$ ACUTE PT VISIT: 1 Visit                   Danielle Dess, PTA 01/22/23, 12:28 PM

## 2023-01-22 NOTE — Discharge Summary (Signed)
Physician Discharge Summary   Patient: Becky Ward MRN: [id number]  DOB: 1955/12/01   Admit:     Date of Admission: 01/14/2023 Admitted from: home   Discharge: Date of discharge: 01/22/23 Disposition: Home health Condition at discharge: good  CODE STATUS: FULL CODE     Discharge Physician: Sunnie Nielsen, DO [facility]     PCP: Margit Hanks, MD  Recommendations for Outpatient Follow-up:  Follow up with PCP Margit Hanks, MD in 2-4 weeks Follow w/ orthopedics and podiatry as directed  Please obtain labs/tests: consider CBC, BMP in 2-4 weeks Please follow up on the following pending results: none     Discharge Instructions     Call MD for:  redness, tenderness, or signs of infection (pain, swelling, redness, odor or green/yellow discharge around incision site)   Complete by: As directed    Call MD for:  severe uncontrolled pain   Complete by: As directed    Diet - low sodium heart healthy   Complete by: As directed    Increase activity slowly   Complete by: As directed          Discharge Diagnoses: Principal Problem:   Closed right trimalleolar fracture, initial encounter Active Problems:   Closed fracture of right proximal humerus   Alcohol use disorder   Alcohol intoxication (HCC)   Hypokalemia   Anemia, mild   Benign essential HTN   Malnutrition of moderate degree       Hospital Course: Becky Ward is a 67 y.o. female with medical history significant for 67 year old female with a history of hypertension, alcohol use disorder and nicotine dependence who presents to the ED via EMS following a fall onto her right side while walking her dogs, after her dogs ran into another dog.  She landed on her right shoulder with immediate pain of the right shoulder and twisted her ankle and has been unable to walk since the incident.  She hit her head but denies loss of consciousness.  07/22: To ED and admitted to hospitalist service  for treatment closed right trimalleolar fracture and right proximal humerus fracture. 07/23: Podiatry and orthopedics evaluated the patient.  Podiatry planning for surgery 07/24: Surgical intervention pending 07/25: ORIF this afternoon 07/26-07/28: awaiting placement for SNF (07/28 is Sunday) 07/29 (Monday): pt now would like to go home w/ HH, TOC working to arrange this.  07/30: confirming HH, pt awaiting ride home (her son in law is only one who can help her inside).   Consultants:  Orthopedics Podiatry  Procedures: 01/17/23 ORIF and application of external fixator for R trimalleolar ankle fracture      ASSESSMENT & PLAN:   Closed right trimalleolar fracture, initial encounter S/p fracture reduction in the ED s/p ORIF and external fixation application w/ podiatry Dr Logan Bores, has follow up in place  Pain control - IV rx d/c, po Rx to pharmacy  PT/OT as able SNF rehab pending placement then pt decided for [facility]   Closed fracture of right proximal humerus Orthopedics, Dr. Signa Kell evaluated and s/o Maintain sling as recommended from the ED Not planning any surgical intervention - confirmed w/ ortho 07/27 continue NWB and pain control Follow outpatient w/ ortho in 1 week  Alcohol use disorder Alcohol intoxication - resolved  EtOH level was 214 on admission  S/p CIWA withdrawal protocols Abstinence / reduction encouraged   Hypokalemia - resolved  Replace as needed Monitor BMP   Anemia, mild - stable, slight drop hgb postop Monitor periodic  CBC   Benign essential HTN Normotensive to low BP Hold antihypertensives pending outpatient followup   Malnutrition, moderate Dietary following Electrolytes monitored, periodic BMP  Arthritis, RA Pt on methotrexate outpatient Resumed            Discharge Instructions  Allergies as of 01/22/2023       Reactions   Banana Anaphylaxis, Hives   Ciprofloxacin Rash   Lactose Intolerance (gi)    Codeine Rash   Causes  itching and blisters         Medication List     STOP taking these medications    lisinopril-hydrochlorothiazide 20-12.5 MG tablet Commonly known as: ZESTORETIC   ondansetron 4 MG disintegrating tablet Commonly known as: Zofran ODT   potassium chloride SA 20 MEQ tablet Commonly known as: KLOR-CON M       TAKE these medications    albuterol 108 (90 Base) MCG/ACT inhaler Commonly known as: VENTOLIN HFA SMARTSIG:2 Puff(s) By Mouth Every 4 Hours PRN   b complex vitamins capsule Take 1 capsule by mouth daily.   folic acid 1 MG tablet Commonly known as: FOLVITE Take 1 mg by mouth daily.   Lumigan 0.01 % Soln Generic drug: bimatoprost Place 1 drop into both eyes at bedtime.   methotrexate 2.5 MG tablet Commonly known as: RHEUMATREX Take 2.5 mg by mouth once a week. Take 8 tablets weekly   Multi-Vitamin tablet Take 1 tablet by mouth daily.   oxyCODONE-acetaminophen 5-325 MG tablet Commonly known as: PERCOCET/ROXICET Take 1 tablet by mouth every 6 (six) hours as needed for up to 5 days.   pravastatin 20 MG tablet Commonly known as: PRAVACHOL Take 1 tablet by mouth at bedtime.   QC Nicotine Transdermal System 14 mg/24hr patch Generic drug: nicotine Place 14 mg onto the skin daily.   Vitamin D3 10 MCG (400 UNIT) tablet Take by mouth.               Durable Medical Equipment  (From admission, onward)           Start     Ordered   01/21/23 1309  For home use only DME Bedside commode  Once       Comments: Drop Arm  Question:  Patient needs a bedside commode to treat with the following condition  Answer:  Impaired mobility   01/21/23 1308   01/21/23 1307  For home use only DME lightweight manual wheelchair with seat cushion  Once       Comments: Patient suffers from Closed right trimalleolar fracture, which impairs their ability to perform daily activities like ADLs in the home.  A Walking aide will not resolve issue with performing activities of  daily living. A wheelchair will allow patient to safely perform daily activities. Patient is not able to propel themselves in the home using a standard weight wheelchair due to weakness. Patient can self propel in the lightweight wheelchair. Length of need 6 months. Accessories: elevating leg rests (ELRs), wheel locks, extensions and anti-tippers. Back cushion   01/21/23 1308   01/21/23 1302  For home use only DME Hospital bed  Once       Question Answer Comment  Length of Need 6 Months   Patient has (list medical condition): Closed right trimalleolar fracture, Right Shoulder fracture   The above medical condition requires: Patient requires the ability to reposition frequently   Bed type Semi-electric   Support Surface: Gel Overlay      01/21/23 1304  Follow-up Information     [name], DPM. Schedule an appointment as soon as possible for a visit in 1 week(s).   Specialty: Podiatry Why: CALL to make appointment to bee seen by Dr. Logan Bores Friday August 2nd at the [facility] location. Contact information: [address] [place] [address] [phone]         Signa Kell, MD. Schedule an appointment as soon as possible for a visit.   Specialty: Orthopedic Surgery Why: follow up shoulder fracture in 1 week Contact information: [address] [place] [address] [phone]         Margit Hanks, MD. Schedule an appointment as soon as possible for a visit.   Specialty: Internal Medicine Why: hospital follow up appointment in 2-4 weeks Contact information: [address] [place] [address] [phone]                 Allergies  Allergen Reactions   Banana Anaphylaxis and Hives   Ciprofloxacin Rash   Lactose Intolerance (Gi)    Codeine Rash    Causes itching and blisters      Subjective: pt reports pain is controlled, eating and drinking ok, no other concerns at this time     Discharge Exam: BP 121/78   Pulse 70   Temp 98.1 F (36.7 C)   Resp 14   Ht 5\' 3"  (1.6 m)   Wt 58.7 kg   SpO2 96%   BMI 22.92 kg/m  General: Pt is alert, awake, not in acute distress Cardiovascular: RRR, S1/S2, no rubs, no gallops Respiratory: CTA bilaterally, no wheezing, no rhonchi Abdominal: Soft, NT, ND, bowel sounds + Extremities: no edema, no cyanosis. R arm in sling. R leg in spint w/ external fixation      The results of significant diagnostics from this hospitalization (including imaging, microbiology, ancillary and laboratory) are listed below for reference.     Microbiology: Recent Results (from the past 240 hour(s))  Surgical PCR screen     Status: None   Collection Time: 01/15/23  5:19 AM   Specimen: Nasal Mucosa; Nasal Swab  Result Value Ref Range Status   MRSA, PCR NEGATIVE NEGATIVE Final   Staphylococcus aureus NEGATIVE NEGATIVE Final    Comment: (NOTE) The Xpert SA Assay (FDA approved for NASAL specimens in patients 68 years of age and older), is one component of a comprehensive surveillance program. It is not intended to diagnose infection nor to guide or monitor treatment. Performed at [facility], [address] [place], [place] [address]      Labs: BNP (last 3 results) No results for input(s): "BNP" in the last 8760 hours. Basic Metabolic Panel: Recent Labs  Lab 01/16/23 0511 01/17/23 0638 01/18/23 0332  NA 135  --  136  K 4.4 3.7 3.5  CL 108  --  103  CO2 22  --  27  GLUCOSE 100*  --  213*  BUN 11  --  14  CREATININE 0.53  --  0.48  CALCIUM 8.0*  --  8.2*  MG 2.3 2.1 2.1  PHOS 2.0* 4.1 3.6   Liver Function Tests: No results for input(s): "AST", "ALT", "ALKPHOS", "BILITOT", "PROT", "ALBUMIN" in the last 168 hours. No results for input(s): "LIPASE", "AMYLASE" in the last 168 hours. No results for input(s): "AMMONIA" in the last 168 hours. CBC: Recent Labs  Lab 01/16/23 0511 01/18/23 0332  WBC 7.5 8.0   NEUTROABS 4.8  --  HGB 9.2* 8.1*  HCT 27.5* 25.0*  MCV 99.6 100.8*  PLT 351 337   Cardiac Enzymes: No results for input(s): "CKTOTAL", "CKMB", "CKMBINDEX", "TROPONINI" in the last 168 hours. BNP: Invalid input(s): "POCBNP" CBG: Recent Labs  Lab 01/22/23 1215  GLUCAP 118*   D-Dimer No results for input(s): "DDIMER" in the last 72 hours. Hgb A1c No results for input(s): "HGBA1C" in the last 72 hours. Lipid Profile No results for input(s): "CHOL", "HDL", "LDLCALC", "TRIG", "CHOLHDL", "LDLDIRECT" in the last 72 hours. Thyroid function studies No results for input(s): "TSH", "T4TOTAL", "T3FREE", "THYROIDAB" in the last 72 hours.  Invalid input(s): "FREET3" Anemia work up No results for input(s): "VITAMINB12", "FOLATE", "FERRITIN", "TIBC", "IRON", "RETICCTPCT" in the last 72 hours. Urinalysis    Component Value Date/Time   COLORURINE STRAW (A) 08/08/2021 0610   APPEARANCEUR CLEAR (A) 08/08/2021 0610   APPEARANCEUR Clear 01/06/2014 0909   LABSPEC 1.003 (L) 08/08/2021 0610   LABSPEC 1.017 01/06/2014 0909   PHURINE 5.0 08/08/2021 0610   GLUCOSEU NEGATIVE 08/08/2021 0610   GLUCOSEU Negative 01/06/2014 0909   HGBUR NEGATIVE 08/08/2021 0610   BILIRUBINUR NEGATIVE 08/08/2021 0610   BILIRUBINUR Negative 01/06/2014 0909   KETONESUR NEGATIVE 08/08/2021 0610   PROTEINUR NEGATIVE 08/08/2021 0610   NITRITE NEGATIVE 08/08/2021 0610   LEUKOCYTESUR NEGATIVE 08/08/2021 0610   LEUKOCYTESUR 2+ 01/06/2014 0909   Sepsis Labs Recent Labs  Lab 01/16/23 0511 01/18/23 0332  WBC 7.5 8.0   Microbiology Recent Results (from the past 240 hour(s))  Surgical PCR screen     Status: None   Collection Time: 01/15/23  5:19 AM   Specimen: Nasal Mucosa; Nasal Swab  Result Value Ref Range Status   MRSA, PCR NEGATIVE NEGATIVE Final   Staphylococcus aureus NEGATIVE NEGATIVE Final    Comment: (NOTE) The Xpert SA Assay (FDA approved for NASAL specimens in patients 107 years of age and older), is  one component of a comprehensive surveillance program. It is not intended to diagnose infection nor to guide or monitor treatment. Performed at [facility], [address] [place], [place] [address]    Imaging CT ANKLE RIGHT WO CONTRAST  Result Date: 01/14/2023 CLINICAL DATA:  Trauma EXAM: CT OF THE RIGHT ANKLE WITHOUT CONTRAST TECHNIQUE: Multidetector CT imaging of the right ankle was performed according to the standard protocol. Multiplanar CT image reconstructions were also generated. RADIATION DOSE REDUCTION: This exam was performed according to the departmental dose-optimization program which includes automated exposure control, adjustment of the mA and/or kV according to patient size and/or use of iterative reconstruction technique. COMPARISON:  Right ankle x-ray 01/14/2023 FINDINGS: Bones/Joint/Cartilage The bones are osteopenic. There is an acute comminuted medial malleolus fracture. The tip of the medial malleolus is displaced inferiorly 6 mm. There is also an acute distal fibular fracture which is markedly comminuted at the level of the ankle mortise. The tip of the lateral malleolus as well aligned, but the proximal fracture fragment is displaced anteriorly 1 cm in there is 1 cm of overlap. There is anterior dislocation of the anterior talus in relation to the talar dome. There is a mildly comminuted posterior malleolar fracture. The posterior malleolus as well aligned. The posterior aspect of the distal tibia approximate the anterior talus. The talar dome appears intact. Ligaments Suboptimally assessed by CT. Muscles and Tendons There is intramuscular edema and edema surrounding tendons in the fracture sites. No foreign body. Soft tissues There is subcutaneous edema and swelling surrounding the ankle and lateral aspect of the foot.  IMPRESSION: 1. Acute comminuted medial malleolus fracture. 2. Acute displaced comminuted distal fibular fracture. 3. Acute mildly comminuted posterior  malleolar fracture. 4. Anterior dislocation of the anterior talus in relation to the talar dome. 5. The posterior aspect of the distal tibia approximates the anterior talus. The talar dome appears intact. Electronically Signed   By: Darliss Cheney M.D.   On: 01/14/2023 21:52   DG Ankle 2 Views Right  Result Date: 01/14/2023 CLINICAL DATA:  Postreduction EXAM: RIGHT ANKLE - 2 VIEW COMPARISON:  Radiographs dated January 14, 2023 FINDINGS: Status post reduction and casting redemonstrate trimalleolar fractures. Interval partial improvement of the posterior talar dislocation. IMPRESSION: Status post reduction and casting redemonstrate trimalleolar fractures. Interval partial improvement of the posterior talar dislocation. Electronically Signed   By: Larose Hires D.O.   On: 01/14/2023 21:15   DG Chest Portable 1 View  Result Date: 01/14/2023 CLINICAL DATA:  Fall EXAM: PORTABLE CHEST 1 VIEW COMPARISON:  08/08/2021 FINDINGS: The heart size and mediastinal contours are within normal limits. Aortic atherosclerosis. Both lungs are clear. Acute proximal right humerus fracture. IMPRESSION: No active disease. Acute proximal right humerus fracture. Electronically Signed   By: Jasmine Pang M.D.   On: 01/14/2023 20:09   DG Ankle Right Port  Result Date: 01/14/2023 CLINICAL DATA:  Fall with ankle pain EXAM: PORTABLE RIGHT ANKLE - 2 VIEW COMPARISON:  None Available. FINDINGS: Acute mildly comminuted trimalleolar fracture with posterior dislocation of the talus with respect to the distal tibia. Distal fibular and posterior malleolar fracture fragments are displaced. Copious soft tissue swelling. IMPRESSION: Acute trimalleolar fracture with posterior dislocation of the talus. Electronically Signed   By: Jasmine Pang M.D.   On: 01/14/2023 20:08   DG Pelvis Portable  Result Date: 01/14/2023 CLINICAL DATA:  Fall EXAM: PORTABLE PELVIS 1-2 VIEWS COMPARISON:  None Available. FINDINGS: SI joints are non widened. Pubic symphysis  and rami appear intact. Mild chronic sclerosis at the humeral heads. No fracture or dislocation IMPRESSION: No acute osseous abnormality Electronically Signed   By: Jasmine Pang M.D.   On: 01/14/2023 20:03   DG Tibia/Fibula Right  Result Date: 01/14/2023 CLINICAL DATA:  Fall EXAM: RIGHT TIBIA AND FIBULA - 2 VIEW COMPARISON:  None Available. FINDINGS: Proximal and mid tibia and fibula appear intact. Acute fracture dislocation at the ankle. Diffuse soft tissue swelling IMPRESSION: Acute fracture dislocation at the ankle. See separately dictated ankle report Electronically Signed   By: Jasmine Pang M.D.   On: 01/14/2023 20:02   CT HEAD WO CONTRAST ( )  Result Date: 01/14/2023 CLINICAL DATA:  Head trauma, moderate-severe; Neck trauma (Age >= 65y) EXAM: CT HEAD WITHOUT CONTRAST CT MAXILLOFACIAL WITHOUT CONTRAST CT CERVICAL SPINE WITHOUT CONTRAST TECHNIQUE: Multidetector CT imaging of the head, cervical spine, and maxillofacial structures were performed using the standard protocol without intravenous contrast. Multiplanar CT image reconstructions of the cervical spine and maxillofacial structures were also generated. RADIATION DOSE REDUCTION: This exam was performed according to the departmental dose-optimization program which includes automated exposure control, adjustment of the mA and/or kV according to patient size and/or use of iterative reconstruction technique. COMPARISON:  CT Head 08/08/21 FINDINGS: CT HEAD FINDINGS Brain: No hemorrhage. No hydrocephalus. No extra-axial fluid collection. Unchanged mineralization of the right thalamus. No CT evidence of an acute cortical infarct. There is sequela of mild chronic microvascular ischemic change. Vascular: No hyperdense vessel or unexpected calcification. Skull: Normal. Negative for fracture or focal lesion. Other: None. CT MAXILLOFACIAL FINDINGS Osseous: No fracture or mandibular dislocation.  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 Orbits:  Negative. No traumatic or inflammatory finding. Sinuses: No middle ear or mastoid effusion. Paranasal sinuses are notable for right maxillary antrostomy and osseous findings suggestive of chronic right maxillary sinusitis. Orbits are unremarkable. Soft tissues: Negative. CT CERVICAL SPINE FINDINGS Alignment: Trace anterolisthesis of C2 on C3. Skull base and vertebrae: No acute fracture. No primary bone lesion or focal pathologic process. Soft tissues and spinal canal: No prevertebral fluid or swelling. No visible canal hematoma. Disc levels:  No evidence of high-grade spinal canal stenosis. Upper chest: Negative. Other: None IMPRESSION: 1. No acute intracranial abnormality. 2. No acute facial bone fracture. 3. No acute fracture or traumatic listhesis of the cervical spine. 4. 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 Electronically Signed   By: Lorenza Cambridge M.D.   On: 01/14/2023 20:02   CT Cervical Spine Wo Contrast  Result Date: 01/14/2023 CLINICAL DATA:  Head trauma, moderate-severe; Neck trauma (Age >= 65y) EXAM: CT HEAD WITHOUT CONTRAST CT MAXILLOFACIAL WITHOUT CONTRAST CT CERVICAL SPINE WITHOUT CONTRAST TECHNIQUE: Multidetector CT imaging of the head, cervical spine, and maxillofacial structures were performed using the standard protocol without intravenous contrast. Multiplanar CT image reconstructions of the cervical spine and maxillofacial structures were also generated. RADIATION DOSE REDUCTION: This exam was performed according to the departmental dose-optimization program which includes automated exposure control, adjustment of the mA and/or kV according to patient size and/or use of iterative reconstruction technique. COMPARISON:  CT Head 08/08/21 FINDINGS: CT HEAD FINDINGS Brain: No hemorrhage. No hydrocephalus. No extra-axial fluid collection. Unchanged mineralization of the right thalamus. No CT evidence of an acute cortical infarct. There is sequela of mild chronic microvascular  ischemic change. Vascular: No hyperdense vessel or unexpected calcification. Skull: Normal. Negative for fracture or focal lesion. Other: None. CT MAXILLOFACIAL FINDINGS Osseous: No fracture or mandibular dislocation. 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 Orbits: Negative. No traumatic or inflammatory finding. Sinuses: No middle ear or mastoid effusion. Paranasal sinuses are notable for right maxillary antrostomy and osseous findings suggestive of chronic right maxillary sinusitis. Orbits are unremarkable. Soft tissues: Negative. CT CERVICAL SPINE FINDINGS Alignment: Trace anterolisthesis of C2 on C3. Skull base and vertebrae: No acute fracture. No primary bone lesion or focal pathologic process. Soft tissues and spinal canal: No prevertebral fluid or swelling. No visible canal hematoma. Disc levels:  No evidence of high-grade spinal canal stenosis. Upper chest: Negative. Other: None IMPRESSION: 1. No acute intracranial abnormality. 2. No acute facial bone fracture. 3. No acute fracture or traumatic listhesis of the cervical spine. 4. 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 Electronically Signed   By: Lorenza Cambridge M.D.   On: 01/14/2023 20:02   CT MAXILLOFACIAL WO CONTRAST  Result Date: 01/14/2023 CLINICAL DATA:  Head trauma, moderate-severe; Neck trauma (Age >= 65y) EXAM: CT HEAD WITHOUT CONTRAST CT MAXILLOFACIAL WITHOUT CONTRAST CT CERVICAL SPINE WITHOUT CONTRAST TECHNIQUE: Multidetector CT imaging of the head, cervical spine, and maxillofacial structures were performed using the standard protocol without intravenous contrast. Multiplanar CT image reconstructions of the cervical spine and maxillofacial structures were also generated. RADIATION DOSE REDUCTION: This exam was performed according to the departmental dose-optimization program which includes automated exposure control, adjustment of the mA and/or kV according to patient size and/or use of iterative reconstruction  technique. COMPARISON:  CT Head 08/08/21 FINDINGS: CT HEAD FINDINGS Brain: No hemorrhage. No hydrocephalus. No extra-axial fluid collection. Unchanged mineralization of the right thalamus. No CT evidence of an acute cortical infarct. There  is sequela of mild chronic microvascular ischemic change. Vascular: No hyperdense vessel or unexpected calcification. Skull: Normal. Negative for fracture or focal lesion. Other: None. CT MAXILLOFACIAL FINDINGS Osseous: No fracture or mandibular dislocation. 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 Orbits: Negative. No traumatic or inflammatory finding. Sinuses: No middle ear or mastoid effusion. Paranasal sinuses are notable for right maxillary antrostomy and osseous findings suggestive of chronic right maxillary sinusitis. Orbits are unremarkable. Soft tissues: Negative. CT CERVICAL SPINE FINDINGS Alignment: Trace anterolisthesis of C2 on C3. Skull base and vertebrae: No acute fracture. No primary bone lesion or focal pathologic process. Soft tissues and spinal canal: No prevertebral fluid or swelling. No visible canal hematoma. Disc levels:  No evidence of high-grade spinal canal stenosis. Upper chest: Negative. Other: None IMPRESSION: 1. No acute intracranial abnormality. 2. No acute facial bone fracture. 3. No acute fracture or traumatic listhesis of the cervical spine. 4. 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 Electronically Signed   By: Lorenza Cambridge M.D.   On: 01/14/2023 20:02   DG Humerus Right  Result Date: 01/14/2023 CLINICAL DATA:  Fall EXAM: RIGHT HUMERUS - 2+ VIEW COMPARISON:  None Available. FINDINGS: AC joint is intact. Acute fracture involving the humeral neck with displaced greater tuberosity fracture fragment. No humeral head dislocation IMPRESSION: Acute humeral neck fracture with displaced greater tuberosity fracture fragment. Electronically Signed   By: Jasmine Pang M.D.   On: 01/14/2023 20:01      Time coordinating  discharge: over 30 minutes  SIGNED:  Sunnie Nielsen DO [facility]

## 2023-01-22 NOTE — Plan of Care (Signed)
  Problem: Pain Managment: Goal: General experience of comfort will improve Outcome: Progressing   Problem: Elimination: Goal: Will not experience complications related to bowel motility Outcome: Progressing   Problem: Coping: Goal: Level of anxiety will decrease Outcome: Progressing   Problem: Nutrition: Goal: Adequate nutrition will be maintained Outcome: Progressing   Problem: Safety: Goal: Ability to remain free from injury will improve Outcome: Progressing   Problem: Skin Integrity: Goal: Risk for impaired skin integrity will decrease Outcome: Progressing

## 2023-01-22 NOTE — Progress Notes (Signed)
Nutrition Follow-up  DOCUMENTATION CODES:   Non-severe (moderate) malnutrition in context of social or environmental circumstances  INTERVENTION:   -Continue Boost Breeze po TID, each supplement provides 250 kcal and 9 grams of protein  -Continue MVI with minerals daily -Feeding assistance with meals  NUTRITION DIAGNOSIS:   Moderate Malnutrition related to social / environmental circumstances (etoh abuse) as evidenced by moderate fat depletion, moderate muscle depletion, severe muscle depletion.  Ongoing  GOAL:   Patient will meet greater than or equal to 90% of their needs  Progressing   MONITOR:   PO intake, Supplement acceptance, Labs, Weight trends, I & O's, Skin  REASON FOR ASSESSMENT:   Consult Assessment of nutrition requirement/status  ASSESSMENT:   67 y/o female with h/o HTN, etoh abuse and HLD who is admitted with closed right trimalleolar fracture s/p fracture reduction in the ED and closed fracture of right proximal humerus after a fall.  7/25- s/p Procedure:  1.  Open reduction internal fixation right ankle 2.  Application of external fixator right  Reviewed I/O's: -260 ml x 24 hours and -2.9 L since admission  UOP: 500 ml x 24 hours  Spoke with pt at bedside, who is pleasant and in good spirits today. Pt reports fair appetite; pain often impairs her ability to eat, but shares her pain has been well controlled overall during hospitalization. Pt shares that it is difficult for her to feed herself, as her dominate arm is in a sling and has minimal use of it. She has been trying to navigate the menu and choose foods that are easy for her to pick up and eat, such as sandwiches, ice pops, and cake. She also shares it is often hard for her to take lids off of containers and packages. RD assisted her with this and obtained her lunch and dinner orders so pt did not have to call down.   Pt shares she likes the Boost supplements and has been drinking them. Discussed  importance of good meal and supplement intake to promote healing.    Wt has been stable since admission.   Plan to d/c home with home health services once medically stable for discharge.   Medications reviewed and include folic acid and thiamine.   Labs reviewed: CBGS: 118 (inpatient orders for glycemic control are none).    Diet Order:   Diet Order             Diet regular Room service appropriate? Yes; Fluid consistency: Thin  Diet effective now                   EDUCATION NEEDS:   Education needs have been addressed  Skin:  Skin Assessment: Skin Integrity Issues: Skin Integrity Issues:: Incisions Incisions: closed rt leg  Last BM:  01/20/23  Height:   Ht Readings from Last 1 Encounters:  01/17/23 5\' 3"  (1.6 m)    Weight:   Wt Readings from Last 1 Encounters:  01/22/23 58.7 kg    Ideal Body Weight:  52.2 kg  BMI:  Body mass index is 22.92 kg/m.  Estimated Nutritional Needs:   Kcal:  1750-1950  Protein:  90-105 grams  Fluid:  > 1.7 L    Levada Schilling, RD, LDN, CDCES Registered Dietitian II Certified Diabetes Care and Education Specialist Please refer to [facility] for RD and/or RD on-call/weekend/after hours pager

## 2023-01-23 DIAGNOSIS — S82851A Displaced trimalleolar fracture of right lower leg, initial encounter for closed fracture: Secondary | ICD-10-CM | POA: Diagnosis not present

## 2023-01-23 LAB — GLUCOSE, CAPILLARY
Glucose-Capillary: 103 mg/dL — ABNORMAL HIGH (ref 70–99)
Glucose-Capillary: 141 mg/dL — ABNORMAL HIGH (ref 70–99)
Glucose-Capillary: 98 mg/dL (ref 70–99)
Glucose-Capillary: 101 mg/dL — ABNORMAL HIGH (ref 70–99)

## 2023-01-23 MED ORDER — OXYCODONE-ACETAMINOPHEN 5-325 MG PO TABS
1.0000 | ORAL_TABLET | ORAL | Status: DC | PRN
  Administered 2023-01-23 (×4): 1 via ORAL
  Filled 2023-01-23 (×4): qty 1

## 2023-01-23 MED ORDER — POLYETHYLENE GLYCOL 3350 17 G PO PACK: 17.0000 g | PACK | Freq: Every day | ORAL | 0 refills | Status: DC

## 2023-01-23 MED ORDER — POLYETHYLENE GLYCOL 3350 17 G PO PACK
17.0000 g | PACK | Freq: Every day | ORAL | Status: DC
  Administered 2023-01-23: 17 g via ORAL
  Filled 2023-01-23: qty 1

## 2023-01-23 NOTE — TOC Progression Note (Signed)
Transition of Care [facility]) - Progression Note    Patient Details  Name: Becky Ward MRN: [id number] Date of Birth: 03-Oct-1955  [facility] CM/SW Contact  Marlowe Sax, RN Phone Number: 01/23/2023, 10:25 AM  Clinical Narrative:    Met with the patient to confirm the DC plan, I explained the conversation I had with her Brother, she will call him to confirm that he called [employer] to get the bed delivered and set up, he will take her WC and BSC home when he brings her keys at noon, she provided me with the number to her friend Sedalia Muta that will be helping her and gave permission to call to confirm the plan Diane [phone] I called Diane and left a VM for ac all back   Expected Discharge Plan: Home w Home Health Services Barriers to Discharge: Continued Medical Work up  Expected Discharge Plan and Services       Living arrangements for the past 2 months: Skilled Nursing Facility                 DME Arranged: Hospital bed, Wheelchair manual, 3-N-1 DME Agency: [employer] Date DME Agency Contacted: 01/21/23 Time DME Agency Contacted: [phone] Representative spoke with at DME Agency: JON HH Arranged: PT, OT, Nurse's Aide           Social Determinants of Health (SDOH) Interventions SDOH Screenings   Food Insecurity: Food Insecurity Present (01/15/2023)  Housing: High Risk (01/15/2023)  Transportation Needs: Unmet Transportation Needs (01/15/2023)  Utilities: At Risk (01/15/2023)  Financial Resource Strain: High Risk (06/08/2022)   Received from [facility], [facility]  Physical Activity: Inactive (06/08/2022)   Received from [facility], [facility]  Social Connections: Unknown (06/08/2022)   Received from [facility]  Recent Concern: Social Connections - Socially Isolated (06/08/2022)   Received from [facility]  Stress: Stress Concern Present (06/08/2022)    Received from [facility], [facility]  Tobacco Use: High Risk (01/17/2023)    Readmission Risk Interventions     No data to display

## 2023-01-23 NOTE — Plan of Care (Signed)
  Problem: Education: Goal: Knowledge of General Education information will improve Description: Including pain rating scale, medication(s)/side effects and non-pharmacologic comfort measures Outcome: Progressing   Problem: Health Behavior/Discharge Planning: Goal: Ability to manage health-related needs will improve Outcome: Progressing   Problem: Clinical Measurements: Goal: Diagnostic test results will improve Outcome: Progressing   Problem: Activity: Goal: Risk for activity intolerance will decrease Outcome: Progressing   Problem: Nutrition: Goal: Adequate nutrition will be maintained Outcome: Progressing   Problem: Elimination: Goal: Will not experience complications related to bowel motility Outcome: Progressing Goal: Will not experience complications related to urinary retention Outcome: Progressing   Problem: Pain Managment: Goal: General experience of comfort will improve Outcome: Progressing   Problem: Safety: Goal: Ability to remain free from injury will improve Outcome: Progressing   

## 2023-01-23 NOTE — Plan of Care (Signed)
  Problem: Education: Goal: Knowledge of General Education information will improve Description: Including pain rating scale, medication(s)/side effects and non-pharmacologic comfort measures 01/23/2023 1812 by Dillard Essex, RN Outcome: Completed/Met 01/23/2023 1638 by Dillard Essex, RN Outcome: Progressing   Problem: Health Behavior/Discharge Planning: Goal: Ability to manage health-related needs will improve 01/23/2023 1812 by Dillard Essex, RN Outcome: Completed/Met 01/23/2023 1638 by Dillard Essex, RN Outcome: Progressing   Problem: Clinical Measurements: Goal: Ability to maintain clinical measurements within normal limits will improve 01/23/2023 1812 by Dillard Essex, RN Outcome: Completed/Met 01/23/2023 1638 by Dillard Essex, RN Outcome: Progressing Goal: Will remain free from infection 01/23/2023 1812 by Dillard Essex, RN Outcome: Completed/Met 01/23/2023 1638 by Dillard Essex, RN Outcome: Progressing Goal: Diagnostic test results will improve 01/23/2023 1812 by Dillard Essex, RN Outcome: Completed/Met 01/23/2023 1638 by Dillard Essex, RN Outcome: Progressing Goal: Respiratory complications will improve 01/23/2023 1812 by Dillard Essex, RN Outcome: Completed/Met 01/23/2023 1638 by Dillard Essex, RN Outcome: Progressing Goal: Cardiovascular complication will be avoided 01/23/2023 1812 by Dillard Essex, RN Outcome: Completed/Met 01/23/2023 1638 by Dillard Essex, RN Outcome: Progressing   Problem: Activity: Goal: Risk for activity intolerance will decrease 01/23/2023 1812 by Dillard Essex, RN Outcome: Completed/Met 01/23/2023 1638 by Dillard Essex, RN Outcome: Progressing   Problem: Nutrition: Goal: Adequate nutrition will be maintained 01/23/2023 1812 by Dillard Essex, RN Outcome: Completed/Met 01/23/2023 1638 by Dillard Essex, RN Outcome: Progressing   Problem: Coping: Goal: Level of anxiety will decrease 01/23/2023  1812 by Dillard Essex, RN Outcome: Completed/Met 01/23/2023 1638 by Dillard Essex, RN Outcome: Progressing   Problem: Elimination: Goal: Will not experience complications related to bowel motility 01/23/2023 1812 by Dillard Essex, RN Outcome: Completed/Met 01/23/2023 1638 by Dillard Essex, RN Outcome: Progressing Goal: Will not experience complications related to urinary retention 01/23/2023 1812 by Dillard Essex, RN Outcome: Completed/Met 01/23/2023 1638 by Dillard Essex, RN Outcome: Progressing   Problem: Pain Managment: Goal: General experience of comfort will improve 01/23/2023 1812 by Dillard Essex, RN Outcome: Completed/Met 01/23/2023 1638 by Dillard Essex, RN Outcome: Progressing   Problem: Safety: Goal: Ability to remain free from injury will improve 01/23/2023 1812 by Dillard Essex, RN Outcome: Completed/Met 01/23/2023 1638 by Dillard Essex, RN Outcome: Progressing   Problem: Skin Integrity: Goal: Risk for impaired skin integrity will decrease 01/23/2023 1812 by Dillard Essex, RN Outcome: Completed/Met 01/23/2023 1638 by Dillard Essex, RN Outcome: Progressing

## 2023-01-23 NOTE — Progress Notes (Signed)
Pt/pt's brother were educated on proper techniques for ascending/descending stairs in w/c. Author recommends pt have +2 assistance for performing stairs in w/c at DC. Also recommended to watch educational video. Pt's brother talked to DME company while Thereasa Parkin was in room. He is planning to be at pt's house to receive hospital bed, BSC and slideboard this afternoon. Both pt and brother state understanding and confidence in safe DC home with EMS transport.

## 2023-01-23 NOTE — TOC Progression Note (Signed)
Transition of Care [facility]) - Progression Note    Patient Details  Name: Becky Ward MRN: [id number] Date of Birth: 28-Feb-1956  [facility]) CM/SW Contact  Marlowe Sax, RN Phone Number: 01/23/2023, 1:35 PM  Clinical Narrative:     Georgina Snell, Becky Ward wife answered, she provided me Becky Ward cell number [phone]. He stated that he rescheduled Becky Ward appointment for a later date, he is coming to the hospital to pick up her DME and the hospital bed will be delivered to her home at 4 PM He will be meeting PT here to be trained on transport, EMS will transport home  Expected Discharge Plan: Home w Home Health Services Barriers to Discharge: Continued Medical Work up  Expected Discharge Plan and Services       Living arrangements for the past 2 months: Skilled Nursing Facility                 DME Arranged: Hospital bed, Wheelchair manual, 3-N-1 DME Agency: [employer] Date DME Agency Contacted: 01/21/23 Time DME Agency Contacted: [phone] Representative spoke with at DME Agency: JON HH Arranged: PT, OT, Nurse's Aide           Social Determinants of Health (SDOH) Interventions SDOH Screenings   Food Insecurity: Food Insecurity Present (01/15/2023)  Housing: High Risk (01/15/2023)  Transportation Needs: Unmet Transportation Needs (01/15/2023)  Utilities: At Risk (01/15/2023)  Financial Resource Strain: High Risk (06/08/2022)   Received from [facility], [facility]  Physical Activity: Inactive (06/08/2022)   Received from [facility], [facility]  Social Connections: Unknown (06/08/2022)   Received from [facility]  Recent Concern: Social Connections - Socially Isolated (06/08/2022)   Received from [facility]  Stress: Stress Concern Present (06/08/2022)   Received from [facility], [facility]  Tobacco Use: High Risk  (01/17/2023)    Readmission Risk Interventions     No data to display

## 2023-01-23 NOTE — Plan of Care (Signed)

## 2023-01-23 NOTE — Progress Notes (Signed)
Occupational Therapy Treatment Patient Details Name: Becky Ward MRN: [id number] DOB: Sep 28, 1955 Today's Date: 01/23/2023   History of present illness Pt is a 67 year old female admitted after a fall with Closed right trimalleolar fracture, closed fracture of R proximal humerus, now s/p  Open reduction internal fixation right ankle, Application of external fixator right on 01/17/23;  PMH significant for hypertension, alcohol use disorder and nicotine dependence, rheumatoid arthritis   OT comments  Becky Ward was seen for OT treatment on this date. Upon arrival to room pt reclined in bed, requesting to use BSC and agreeable to tx. Pt requires CGA bed>BSC>w/c>bed squat pivot t/fs with MIN cues for proper positioning of equipment. Significantly improved ability to maintain RLE NWBing with use of squat or stand pivot t/f. Reports 7/10 pain however completed all transfers with use of breathing techniques and no crying. IND for pericare in sitting. MOD A don/doff sling and MIN A don shirt in sitting. Pt making good progress toward goals, will continue to follow POC. Continue to recommend discharge to STR however pt plans to return home with equipment and assistance from family. Reviewed safe t/f techniques and hemi dressing strategies.    Recommendations for follow up therapy are one component of a multi-disciplinary discharge planning process, led by the attending physician.  Recommendations may be updated based on patient status, additional functional criteria and insurance authorization.    Assistance Recommended at Discharge Intermittent Supervision/Assistance  Patient can return home with the following  A lot of help with bathing/dressing/bathroom;A little help with walking and/or transfers;Assistance with cooking/housework;Direct supervision/assist for medications management;Assist for transportation;Help with stairs or ramp for entrance   Equipment Recommendations  Hospital bed;BSC/3in1     Recommendations for Other Services      Precautions / Restrictions Precautions Precautions: Fall Required Braces or Orthoses: Sling;Other Brace Other Brace: RLE external fixator, RUE sling Restrictions Weight Bearing Restrictions: Yes RUE Weight Bearing: Non weight bearing RLE Weight Bearing: Non weight bearing Other Position/Activity Restrictions: RUE in sling, may remove for dangle and hygiene, elbow/wrist, finger ROM ok; NWBing RLE in external fixator       Mobility Bed Mobility Overal bed mobility: Needs Assistance Bed Mobility: Supine to Sit, Sit to Supine     Supine to sit: Min assist Sit to supine: Supervision   General bed mobility comments: increased assist 2/2 pain    Transfers Overall transfer level: Needs assistance Equipment used: None Transfers: Bed to chair/wheelchair/BSC     Squat pivot transfers: Min guard       General transfer comment: CGA bed>BSC>w/c>bed squat pivot t/fs with MIN cues for proper positioning of equipment     Balance Overall balance assessment: Needs assistance Sitting-balance support: Feet supported, Single extremity supported Sitting balance-Leahy Scale: Good     Standing balance support: Single extremity supported, During functional activity Standing balance-Leahy Scale: Fair                             ADL either performed or assessed with clinical judgement   ADL Overall ADL's : Needs assistance/impaired                                       General ADL Comments: CGA for BSC t/f, MOD I pericare in sitting. MOD A don/doff sling and MIN A don shirt in sitting.  Cognition Arousal/Alertness: Awake/alert Behavior During Therapy: WFL for tasks assessed/performed Overall Cognitive Status: Within Functional Limits for tasks assessed                                                     Pertinent Vitals/ Pain       Pain Assessment Pain Assessment: 0-10 Pain Score:  7  Pain Location: RLE Pain Descriptors / Indicators: Discomfort, Grimacing, Guarding Pain Intervention(s): Limited activity within patient's tolerance, Repositioned, Patient requesting pain meds-RN notified   Frequency  Min 1X/week        Progress Toward Goals  OT Goals(current goals can now be found in the care plan section)  Progress towards OT goals: Progressing toward goals  Acute Rehab OT Goals Patient Stated Goal: to go home OT Goal Formulation: With patient Time For Goal Achievement: 02/01/23 Potential to Achieve Goals: Good ADL Goals Pt Will Perform Grooming: with modified independence;sitting Pt Will Perform Upper Body Dressing: with modified independence Pt Will Perform Lower Body Dressing: with modified independence Pt Will Transfer to Toilet: with modified independence;squat pivot transfer Pt Will Perform Toileting - Clothing Manipulation and hygiene: with modified independence;sitting/lateral leans  Plan Discharge plan remains appropriate;Frequency remains appropriate    Co-evaluation                 AM-PAC OT "6 Clicks" Daily Activity     Outcome Measure   Help from another person eating meals?: A Little Help from another person taking care of personal grooming?: A Little Help from another person toileting, which includes using toliet, bedpan, or urinal?: A Little Help from another person bathing (including washing, rinsing, drying)?: A Lot Help from another person to put on and taking off regular upper body clothing?: A Lot Help from another person to put on and taking off regular lower body clothing?: A Lot 6 Click Score: 15    End of Session    OT Visit Diagnosis: Other abnormalities of gait and mobility (R26.89);Unsteadiness on feet (R26.81)   Activity Tolerance Patient tolerated treatment well   Patient Left in bed;with call bell/phone within reach   Nurse Communication          Time: 5366-4403 OT Time Calculation (min): 23  min  Charges: OT General Charges $OT Visit: 1 Visit OT Treatments $Self Care/Home Management : 23-37 mins Becky Ward, M.S. OTR/L  01/23/23, 2:17 PM  ascom [phone]

## 2023-01-23 NOTE — Progress Notes (Signed)
Discharge instructions and papers given to patient. Vital signs take, pain medicine was given prior to discharge because patient was complaining of pain.

## 2023-01-23 NOTE — Progress Notes (Signed)
Medications and discharge instructions reviewed with patient.  All questions answered. PIV x1 removed, no bleeding, intact. Patient verbalized understanding of signs and symptoms of infection. Patient agreed to set up follow up appointments as listed on AVS. Report given to Aurea Graff, night shift RN for continued monitoring.  Awaiting EMS for patient pick up.

## 2023-01-23 NOTE — Progress Notes (Signed)
Physical Therapy Treatment Patient Details Name: Becky Ward MRN: [id number] DOB: 09/18/1955 Today's Date: 01/23/2023   History of Present Illness Pt is a 67 year old female admitted after a fall with Closed right trimalleolar fracture, closed fracture of R proximal humerus, now s/p  Open reduction internal fixation right ankle, Application of external fixator right on 01/17/23;  PMH significant for hypertension, alcohol use disorder and nicotine dependence, rheumatoid arthritis    PT Comments  Pt was long sitting in bed upon arrival. She endorses severe pain but was given pain meds earlier this morning. PT endorse 8-9/10 pain in RUE. Author adjusted RUE sling but pt continues to endorse pain. She was agreeable to OOB activity. She was able to exit R side of bed with supervision only. Stood pivot to [facility] prior to stand pivot to her new personal manual WC. Pt struggles with navigating w/c due to wt bearing restrictions. Lengthy education on need for hospital bed and family education on how to perform stairs in w/c. Pt has 4 steps to enter home." My son in law (not actual but I call him that) can carry me into the house." Discussed option of EMS transport home but need for family education on safe stair navigation in w/c." He will be coming this evening after work." Acute OPT will continue to follow and progress as able per current POC.     If plan is discharge home, recommend the following: Help with stairs or ramp for entrance;Assist for transportation;Assistance with feeding;Assistance with cooking/housework;A lot of help with walking and/or transfers;A little help with bathing/dressing/bathroom   Can travel by private vehicle     No  Equipment Recommendations  BSC/3in1;Hospital bed (Pt has personal WC in room.)       Precautions / Restrictions Precautions Precautions: Fall Required Braces or Orthoses: Sling;Other Brace Other Brace: RLE external fixator, RUE sling Restrictions Weight Bearing  Restrictions: Yes RUE Weight Bearing: Non weight bearing RLE Weight Bearing: Non weight bearing     Mobility  Bed Mobility Overal bed mobility: Needs Assistance Bed Mobility: Supine to Sit, Sit to Supine  Supine to sit: Supervision Sit to supine: Supervision   Transfers Overall transfer level: Needs assistance Equipment used: 1 person hand held assist Transfers: Bed to chair/wheelchair/BSC Sit to Stand: Min assist, Mod assist Stand pivot transfers: Min assist, Mod assist  General transfer comment: pt was able to stand from EOB and pivot to [facility] then stand and pivot to w/e. Pt then demonstrated poor ability to self navigate WC    Ambulation/Gait  Gait velocity: decreased  General Gait Details: currenly unable/unsafe due to wt bearing restrictions   [employer]chant [employer] officer mobility: Yes Wheelchair propulsion: Left upper extremity, Left lower extremity Wheelchair parts: Needs assistance Distance: 45 Wheelchair Assistance Details (indicate cue type and reason): Pt was educated on w/c and how to manage parts. She needed extensive assistance to self propel due to wt bearing restrictions. Pt will benefit from continues practice with WC for improved safety/abilities    Balance Overall balance assessment: Needs assistance Sitting-balance support: Feet supported, Single extremity supported Sitting balance-Leahy Scale: Good     Standing balance support: Single extremity supported, During functional activity Standing balance-Leahy Scale: Fair         Cognition Arousal/Alertness: Awake/alert Behavior During Therapy: WFL for tasks assessed/performed Overall Cognitive Status: Within Functional Limits for tasks assessed          General Comments General comments (skin integrity, edema, etc.): Discussed at length DC and  managing expectations. Pt is eager to get home however poor insight of what will be involved for safety/ awareness. Pt mentioned several  times going home and just staying in bed. Was educated on concerns with this plan      Pertinent Vitals/Pain Pain Assessment Pain Assessment: 0-10 Pain Score: 8  Pain Location: R shoulder > RLE Pain Descriptors / Indicators: Discomfort, Grimacing, Guarding Pain Intervention(s): Limited activity within patient's tolerance, Monitored during session, Premedicated before session, Repositioned     PT Goals (current goals can now be found in the care plan section) Acute Rehab PT Goals Patient Stated Goal: less pain and go home ASAP Progress towards PT goals: Progressing toward goals    Frequency    7X/week      PT Plan Current plan remains appropriate    Co-evaluation     PT goals addressed during session: Mobility/safety with mobility;Balance;Proper use of DME;Strengthening/ROM        AM-PAC PT "6 Clicks" Mobility   Outcome Measure  Help needed turning from your back to your side while in a flat bed without using bedrails?: A Little Help needed moving from lying on your back to sitting on the side of a flat bed without using bedrails?: A Little Help needed moving to and from a bed to a chair (including a wheelchair)?: A Lot Help needed standing up from a chair using your arms (e.g., wheelchair or bedside chair)?: A Little Help needed to walk in hospital room?: A Lot Help needed climbing 3-5 steps with a railing? : Total 6 Click Score: 14    End of Session Equipment Utilized During Treatment: Gait belt Activity Tolerance: Patient tolerated treatment well Patient left: in bed;with call bell/phone within reach;with bed alarm set Nurse Communication: Mobility status PT Visit Diagnosis: Pain;Other abnormalities of gait and mobility (R26.89);Difficulty in walking, not elsewhere classified (R26.2);Muscle weakness (generalized) (M62.81) Pain - Right/Left: Right Pain - part of body: Shoulder     Time: 1610-9604 PT Time Calculation (min) (ACUTE ONLY): 31 min  Charges:     $Therapeutic Activity: 8-22 mins $Wheel Chair Management: 8-22 mins PT General Charges $$ ACUTE PT VISIT: 1 Visit                    Jetta Lout PTA 01/23/23, 10:12 AM

## 2023-01-23 NOTE — Progress Notes (Signed)
EMS called at this time for patient pick to home.

## 2023-01-23 NOTE — TOC Progression Note (Signed)
Transition of Care [facility]) - Progression Note    Patient Details  Name: Becky Ward MRN: [id number] Date of Birth: 06/11/1956  Transition of Care [facility]) CM/SW Contact  Marlowe Sax, RN Phone Number: 01/23/2023, 9:59 AM  Clinical Narrative:     Called and spoke with Tinnie Gens the patient's Brother He said that he has an appointment at noon in [place] at the [facility] stated that he would not be available until around 4 PM for the Hospital Bed to be set up, He is going to call [employer] to let them know what time to come, he stated that he has all of the patient's keys to her gate and house, he will bring them to the hospital after his appointment so EMS can get into her property and into her home, He will pick up her wheelchair at that time, The patient reports that her grad daughter's other grand mother is to come to her home daily after dropping off her grand daughter at school to help her and that her brother will come at night.   Expected Discharge Plan: Home w Home Health Services Barriers to Discharge: Continued Medical Work up  Expected Discharge Plan and Services       Living arrangements for the past 2 months: Skilled Nursing Facility                 DME Arranged: Hospital bed, Wheelchair manual, 3-N-1 DME Agency: [employer] Date DME Agency Contacted: 01/21/23 Time DME Agency Contacted: [phone] Representative spoke with at DME Agency: [facility] HH Arranged: PT, OT, Nurse's Aide           Social Determinants of Health (SDOH) Interventions SDOH Screenings   Food Insecurity: Food Insecurity Present (01/15/2023)  Housing: High Risk (01/15/2023)  Transportation Needs: Unmet Transportation Needs (01/15/2023)  Utilities: At Risk (01/15/2023)  Financial Resource Strain: High Risk (06/08/2022)   Received from [facility], [facility]  Physical Activity: Inactive (06/08/2022)   Received from [facility], [facility]  Social Connections: Unknown (06/08/2022)   Received from [facility]  Recent Concern: Social Connections - Socially Isolated (06/08/2022)   Received from [facility]  Stress: Stress Concern Present (06/08/2022)   Received from [facility], [facility]  Tobacco Use: High Risk (01/17/2023)    Readmission Risk Interventions     No data to display

## 2023-01-23 NOTE — Progress Notes (Signed)
Progress Note   Patient: Becky Ward GNF:[id number] DOB: Jul 14, 1955 DOA: 01/14/2023     9 DOS: the patient was seen and examined on 01/23/2023     Subjective: Patient seen and examined at bedside this morning patient being discharged home today, was unable to go home yesterday because of EMS not being available to pick her   Brief Narrative / Hospital Course:  Aylyn Tarazona is a 67 y.o. female with medical history significant for 67 year old female with a history of hypertension, alcohol use disorder and nicotine dependence who presents to the ED via EMS following a fall onto her right side while walking her dogs, after her dogs ran into another dog.  She landed on her right shoulder with immediate pain of the right shoulder and twisted her ankle and has been unable to walk since the incident.  She hit her head but denies loss of consciousness.  07/22: To ED and admitted to hospitalist service for treatment closed right trimalleolar fracture and right proximal humerus fracture. 07/23: Podiatry and orthopedics evaluated the patient.  Podiatry planning for surgery 07/24: Surgical intervention pending 07/25: ORIF this afternoon 07/26-07/28: awaiting placement for SNF (07/28 is Sunday) 07/29 (Monday): pt now would like to go home w/ HH, TOC working to arrange this.  7/31, patient being discharged home today, was unable to go home yesterday because of EMS not being available to pick her     Procedures: 01/17/23 ORIF and application of external fixator for R trimalleolar ankle fracture           ASSESSMENT & PLAN:   Principal Problem:   Closed right trimalleolar fracture, initial encounter Active Problems:   Closed fracture of right proximal humerus   Alcohol use disorder   Alcohol intoxication (HCC)   Hypokalemia   Anemia, mild   Benign essential HTN   Malnutrition of moderate degree     Closed right trimalleolar fracture, initial encounter S/p fracture reduction in the  ED s/p ORIF and external fixation application w/ podiatry Dr Logan Bores  Pain control - IV rx d/c PT/OT as able SNF rehab pending placement then pt decided for [facility]   Closed fracture of right proximal humerus Orthopedics, Dr. Signa Kell evaluated and s/o Maintain sling as recommended from the ED Not planning any surgical intervention - confirmed w/ ortho 07/27 continue NWB and pain control Follow outpatient w/ ortho   Alcohol use disorder Alcohol intoxication - resolved  EtOH level was 214 on admission  CIWA withdrawal protocol Folic acid, thiamine   Hypokalemia Replace as needed Monitor BMP   Anemia, mild Hemoglobin 10.8 near baseline of 11.1 Monitor periodic CBC   Benign essential HTN Normotensive to low BP Hold antihypertensives   Malnutrition, moderate Dietary following Electrolytes monitored, periodic BMP   Arthritis question RA Pt on methotrexate outpatient Resumed      DVT prophylaxis: SCD Pertinent IV fluids/nutrition: No continuous IV fluids Central lines / invasive devices: PureWick in place d/t immobility,   Code Status: Full code ACP documentation reviewed: Reviewed 01/16/2023, none on file   Current Admission Status: Inpatient TOC needs / Dispo plan: SNF rehab was in process, now working on [facility] Barriers to discharge / significant pending items: HH/DME delivery      Subjective / Brief ROS:  Resting comfortably, no concerns    Family Communication: none at this time         Examination:  Physical Exam Constitutional:      General: She is not in acute distress.  Appearance: She is not ill-appearing.  Pulmonary:     Effort: Pulmonary effort is normal.  Neurological:     Mental Status: She is alert.    Vitals:   01/22/23 0731 01/22/23 1517 01/22/23 2340 01/23/23 0731  BP: 121/78 110/76 119/75 120/77  Pulse: 70 81 80 73  Resp: 14 16 16 14   Temp: 98.1 F (36.7 C) 98.7 F (37.1 C) 98.9 F (37.2 C) 98.4 F (36.9 C)  TempSrc:      SpO2:  96% 97% 97% 97%  Weight:      Height:         Author: Loyce Dys, MD 01/23/2023 5:21 PM  For on call review [email or web address].

## 2023-01-24 ENCOUNTER — Other Ambulatory Visit (HOSPITAL_COMMUNITY): Payer: Self-pay

## 2023-01-29 ENCOUNTER — Ambulatory Visit: Payer: Medicare Other | Admitting: Podiatry

## 2023-02-12 ENCOUNTER — Ambulatory Visit (INDEPENDENT_AMBULATORY_CARE_PROVIDER_SITE_OTHER): Payer: Medicare Other

## 2023-02-12 ENCOUNTER — Ambulatory Visit: Payer: Medicare Other | Admitting: Podiatry

## 2023-02-12 ENCOUNTER — Encounter: Payer: Self-pay | Admitting: Podiatry

## 2023-02-12 ENCOUNTER — Ambulatory Visit (INDEPENDENT_AMBULATORY_CARE_PROVIDER_SITE_OTHER): Payer: Medicare Other | Admitting: Podiatry

## 2023-02-12 DIAGNOSIS — S82851A Displaced trimalleolar fracture of right lower leg, initial encounter for closed fracture: Secondary | ICD-10-CM

## 2023-02-12 MED ORDER — OXYCODONE-ACETAMINOPHEN 5-325 MG PO TABS: 1.0000 | ORAL_TABLET | Freq: Four times a day (QID) | ORAL | 0 refills | Status: DC | PRN

## 2023-02-12 NOTE — Progress Notes (Signed)
   Chief Complaint  Patient presents with   Routine Post Op    DOS 01/17/2023 OPEN REDUCTION INTERNAL FIXATION (ORIF) ANKLE FRACTURE Right "I'm so tired of this pain.  It wakes me up at night.  It's like an electrical shock.  My left knee is starting to bother me now.  How long am I supposed to have this contraption?"    Subjective:  Patient presents today status post ORIF with additional external fixation right ankle fracture performed inpatient.  DOS: 01/17/2023.  Patient is NWB.  She is very frustrated with the recovery.  Constant pain.  She says that she is relying on her contralateral limb and experiencing increased knee pain to her left lower extremity.  Past Medical History:  Diagnosis Date   Colitis    Hypertension     Past Surgical History:  Procedure Laterality Date   NASAL SINUS SURGERY     ORIF ANKLE FRACTURE Right 01/17/2023   Procedure: OPEN REDUCTION INTERNAL FIXATION (ORIF) ANKLE FRACTURE;  Surgeon: [name], DPM;  Location: [facility] ORS;  Service: Orthopedics/Podiatry;  Laterality: Right;    Allergies  Allergen Reactions   Banana Anaphylaxis and Hives   Ciprofloxacin Rash   Lactose Intolerance (Gi)    Codeine Rash    Causes itching and blisters     Objective/Physical Exam Neurovascular status intact.  Incision well coapted with staples intact. No sign of infectious process noted. No dehiscence. No active bleeding noted.  Moderate edema noted to the surgical extremity.  The external monorail is intact.  Schanz pins intact.  Radiographic Exam RT ankle 02/12/2023:  Orthopedic hardware and osteotomies sites appear to be stable with routine healing.  Good restoration of the tibiotalar joint and ankle mortise.  Assessment: 1. s/p ORIF with external fixation right ankle fracture. DOS: 01/17/2023   Plan of Care:  -Patient was evaluated. X-rays reviewed -Staples removed -Dressings changed.  Leave clean dry and intact - Continue NWB using wheelchair -Plan for  removal of external fixation device in 3 weeks.  That will be approximately 6 to 7 weeks postop.  Authorization and consent for the procedure to remove the external fixation device obtained today.  Will plan for this at [facility] -Refill prescription for Percocet 5/325 mg every 6 hours as needed pain -Handicap parking placard completed today -Return to clinic 1 week after removal of the external fixation device    [name], DPM [facility]  Dr. [name], DPM    [address] [place], [place] [address]                Office [phone]  Fax [phone]

## 2023-02-15 ENCOUNTER — Telehealth: Payer: Self-pay | Admitting: Podiatry

## 2023-02-15 NOTE — Telephone Encounter (Signed)
Notification or Prior Authorization is not required for the requested services You are not required to submit a notification/prior authorization based on the information provided. If you have general questions about the prior authorization requirements, visit [email or web address] > Clinician Resources > Advance and Admission Notification Requirements. The number above acknowledges your notification. Please write this reference number down for future reference. If you would like to request an organization determination, please call us at [phone]. Decision ID #: [id number] The number above acknowledges your inquiry and our response. Please write this number down and refer to it for future inquiries. Coverage and payment for an item or service is governed by the member's benefit plan document, and, if applicable, the provider's participation agreement with the Health Plan. Patient details  Effective date 06/25/2022 Termination date 06/25/2023 Insurance type Medicare  Name Becky Ward Tax ID number [id number] Address [address] [place], [place], [place] [address] Status In network Service details  Place of service Outpatient [facility] What is place of service? Service details Surgical Facility details  Name [facility] Tax ID number [id number] Address [address] [place], [place] [address] [phone] Status In network   Start date 03/08/2023 End date 06/06/2023 Service description Scheduled What is service description? Diagnosis code details  Code pointer Primary Diagnosis code [phone] Description Displaced trimalleolar fracture of right lower leg, initial encounter for closed fracture Procedure code details  Code pointer Primary Procedure Code 54008 Description Removal, under anesthesia, of external fixation system Selected servicing provider The provider who is providing the service being requested.  Servicing  provider name Pandora Leiter Tax ID number [id number] Address [address] [place], [place], [place] [address]  T. [phone]

## 2023-03-01 ENCOUNTER — Encounter: Payer: Self-pay | Admitting: Podiatry

## 2023-03-06 ENCOUNTER — Other Ambulatory Visit: Payer: Self-pay

## 2023-03-06 ENCOUNTER — Encounter
Admission: RE | Admit: 2023-03-06 | Discharge: 2023-03-06 | Disposition: A | Discharge status: Home / Self Care | Payer: Medicare Other | Source: Ambulatory Visit | Attending: Podiatry | Admitting: Podiatry

## 2023-03-06 VITALS — Ht 63.5 in | Wt 117.0 lb

## 2023-03-06 DIAGNOSIS — D75839 Thrombocytosis, unspecified: Secondary | ICD-10-CM

## 2023-03-06 DIAGNOSIS — I10 Essential (primary) hypertension: Secondary | ICD-10-CM

## 2023-03-06 DIAGNOSIS — Z01812 Encounter for preprocedural laboratory examination: Secondary | ICD-10-CM

## 2023-03-06 DIAGNOSIS — D649 Anemia, unspecified: Secondary | ICD-10-CM

## 2023-03-06 DIAGNOSIS — E876 Hypokalemia: Secondary | ICD-10-CM

## 2023-03-06 HISTORY — DX: Essential (primary) hypertension: I10

## 2023-03-06 HISTORY — DX: Unspecified chronic gastritis without bleeding: K29.50

## 2023-03-06 HISTORY — DX: Other nonspecific abnormal finding of lung field: R91.8

## 2023-03-06 HISTORY — DX: Anemia, unspecified: D64.9

## 2023-03-06 HISTORY — DX: Rheumatoid arthritis with rheumatoid factor, unspecified: M05.9

## 2023-03-06 HISTORY — DX: Pure hypercholesterolemia, unspecified: E78.00

## 2023-03-06 HISTORY — DX: Thrombocytosis, unspecified: D75.839

## 2023-03-06 HISTORY — DX: Hypokalemia: E87.6

## 2023-03-06 NOTE — Patient Instructions (Addendum)
Your procedure is scheduled on: Friday, September 13 Report to the Registration Desk on the 1st floor of the [facility] To find out your arrival time, please call [phone] between 1PM - 3PM on: Thursday, September 12 If your arrival time is 6:00 am, do not arrive before that time as the [facility] entrance doors do not open until 6:00 am.  REMEMBER: Instructions that are not followed completely may result in serious medical risk, up to and including death; or upon the discretion of your surgeon and anesthesiologist your surgery may need to be rescheduled.  Do not eat or drink after midnight the night before surgery.  No gum chewing or hard candies.  One week prior to surgery: staring today, September 11 Stop Anti-inflammatories (NSAIDS) such as Advil, Aleve, Ibuprofen, Motrin, Naproxen, Naprosyn and Aspirin based products such as Excedrin, Goody's Powder, BC Powder. Stop ANY OVER THE COUNTER supplements until after surgery. Stop vitamin D, folic acid, multiple vitamin, vitamin b complex You may however, continue to take Tylenol if needed for pain up until the day of surgery.  Continue taking all prescribed medications   TAKE ONLY THESE MEDICATIONS THE MORNING OF SURGERY WITH A SIP OF WATER:  Potassium  Oxycodone if needed for pain  No Alcohol for 24 hours before or after surgery.  No Smoking including e-cigarettes for 24 hours before surgery.  No chewable tobacco products for at least 6 hours before surgery.  No nicotine patches on the day of surgery.  Do not use any "recreational" drugs for at least a week (preferably 2 weeks) before your surgery.  Please be advised that the combination of cocaine and anesthesia may have negative outcomes, up to and including death. If you test positive for cocaine, your surgery will be cancelled.  On the morning of surgery brush your teeth with toothpaste and water, you may rinse your mouth with mouthwash if you wish. Do not swallow any  toothpaste or mouthwash.  Do not wear jewelry, make-up, hairpins, clips or nail polish.  For welded (permanent) jewelry: bracelets, anklets, waist bands, etc.  Please have this removed prior to surgery.  If it is not removed, there is a chance that hospital personnel will need to cut it off on the day of surgery.  Do not wear lotions, powders, or perfumes.   Do not shave body hair from the neck down 48 hours before surgery.  Contact lenses, hearing aids and dentures may not be worn into surgery.  Do not bring valuables to the hospital. [facility] is not responsible for any missing/lost belongings or valuables.   Notify your doctor if there is any change in your medical condition (cold, fever, infection).  Wear comfortable clothing (specific to your surgery type) to the hospital.  After surgery, you can help prevent lung complications by doing breathing exercises.  Take deep breaths and cough every 1-2 hours.   If you are being discharged the day of surgery, you will not be allowed to drive home. You will need a responsible individual to drive you home and stay with you for 24 hours after surgery.   If you are taking public transportation, you will need to have a responsible individual with you.  Please call the Pre-admissions Testing Dept. at [phone] if you have any questions about these instructions.  Surgery Visitation Policy:  Patients having surgery or a procedure may have two visitors.  Children under the age of 46 must have an adult with them who is not  the patient.

## 2023-03-07 ENCOUNTER — Other Ambulatory Visit: Payer: Self-pay | Admitting: Podiatry

## 2023-03-07 MED ORDER — OXYCODONE-ACETAMINOPHEN 5-325 MG PO TABS: 1.0000 | ORAL_TABLET | ORAL | 0 refills | Status: DC | PRN

## 2023-03-07 MED ORDER — CEFAZOLIN SODIUM-DEXTROSE 2-4 GM/100ML-% IV SOLN
2.0000 g | INTRAVENOUS | Status: AC
  Administered 2023-03-08: 2 g via INTRAVENOUS

## 2023-03-07 MED ORDER — CHLORHEXIDINE GLUCONATE 0.12 % MT SOLN
15.0000 mL | Freq: Once | OROMUCOSAL | Status: AC
  Administered 2023-03-08: 15 mL via OROMUCOSAL

## 2023-03-07 MED ORDER — FAMOTIDINE 20 MG PO TABS
20.0000 mg | ORAL_TABLET | Freq: Once | ORAL | Status: AC
  Administered 2023-03-08: 20 mg via ORAL

## 2023-03-07 MED ORDER — ORAL CARE MOUTH RINSE: 15.0000 mL | Freq: Once | OROMUCOSAL | Status: AC

## 2023-03-07 MED ORDER — LACTATED RINGERS IV SOLN: INTRAVENOUS | Status: DC

## 2023-03-07 NOTE — Progress Notes (Signed)
PRN postop 

## 2023-03-07 NOTE — H&P (View-Only) (Signed)
PRN postop

## 2023-03-08 ENCOUNTER — Encounter: Payer: Self-pay | Admitting: Podiatry

## 2023-03-08 ENCOUNTER — Ambulatory Visit: Payer: Medicare Other | Admitting: Anesthesiology

## 2023-03-08 ENCOUNTER — Encounter
Admission: RE | Disposition: A | Discharge status: Home / Self Care | Payer: Self-pay | Source: Home / Self Care | Attending: Podiatry

## 2023-03-08 ENCOUNTER — Ambulatory Visit
Admission: RE | Admit: 2023-03-08 | Discharge: 2023-03-08 | Disposition: A | Discharge status: Home / Self Care | Payer: Medicare Other | Attending: Podiatry | Admitting: Podiatry

## 2023-03-08 ENCOUNTER — Other Ambulatory Visit: Payer: Self-pay

## 2023-03-08 ENCOUNTER — Ambulatory Visit: Payer: Medicare Other

## 2023-03-08 DIAGNOSIS — M069 Rheumatoid arthritis, unspecified: Secondary | ICD-10-CM | POA: Diagnosis not present

## 2023-03-08 DIAGNOSIS — I10 Essential (primary) hypertension: Secondary | ICD-10-CM | POA: Insufficient documentation

## 2023-03-08 DIAGNOSIS — Z4589 Encounter for adjustment and management of other implanted devices: Secondary | ICD-10-CM | POA: Insufficient documentation

## 2023-03-08 DIAGNOSIS — E876 Hypokalemia: Secondary | ICD-10-CM

## 2023-03-08 DIAGNOSIS — F17200 Nicotine dependence, unspecified, uncomplicated: Secondary | ICD-10-CM | POA: Insufficient documentation

## 2023-03-08 DIAGNOSIS — F1090 Alcohol use, unspecified, uncomplicated: Secondary | ICD-10-CM | POA: Diagnosis not present

## 2023-03-08 DIAGNOSIS — D649 Anemia, unspecified: Secondary | ICD-10-CM

## 2023-03-08 DIAGNOSIS — D75839 Thrombocytosis, unspecified: Secondary | ICD-10-CM

## 2023-03-08 DIAGNOSIS — Z01812 Encounter for preprocedural laboratory examination: Secondary | ICD-10-CM

## 2023-03-08 HISTORY — PX: EXTERNAL FIXATION REMOVAL: SHX5040

## 2023-03-08 LAB — POCT I-STAT, CHEM 8
BUN: 16 mg/dL (ref 8–23)
Calcium, Ion: 1.18 mmol/L (ref 1.15–1.40)
Chloride: 106 mmol/L (ref 98–111)
Creatinine, Ser: 0.6 mg/dL (ref 0.44–1.00)
Glucose, Bld: 86 mg/dL (ref 70–99)
HCT: 33 % — ABNORMAL LOW (ref 36.0–46.0)
Hemoglobin: 11.2 g/dL — ABNORMAL LOW (ref 12.0–15.0)
Potassium: 3.6 mmol/L (ref 3.5–5.1)
Sodium: 141 mmol/L (ref 135–145)
TCO2: 22 mmol/L (ref 22–32)

## 2023-03-08 SURGERY — REMOVAL, EXTERNAL FIXATION DEVICE, LOWER EXTREMITY
Anesthesia: General | Site: Ankle | Laterality: Right

## 2023-03-08 MED ORDER — FENTANYL CITRATE (PF) 100 MCG/2ML IJ SOLN
INTRAMUSCULAR | Status: AC
  Filled 2023-03-08: qty 2

## 2023-03-08 MED ORDER — ONDANSETRON HCL 4 MG/2ML IJ SOLN: 4.0000 mg | Freq: Once | INTRAMUSCULAR | Status: DC | PRN

## 2023-03-08 MED ORDER — LIDOCAINE HCL 1 % IJ SOLN
INTRAMUSCULAR | Status: DC | PRN
  Administered 2023-03-08: 18 mL via INTRAMUSCULAR

## 2023-03-08 MED ORDER — CHLORHEXIDINE GLUCONATE 0.12 % MT SOLN
OROMUCOSAL | Status: AC
  Filled 2023-03-08: qty 15

## 2023-03-08 MED ORDER — MIDAZOLAM HCL 2 MG/2ML IJ SOLN
INTRAMUSCULAR | Status: DC | PRN
  Administered 2023-03-08: 2 mg via INTRAVENOUS

## 2023-03-08 MED ORDER — OXYCODONE HCL 5 MG PO TABS
5.0000 mg | ORAL_TABLET | Freq: Once | ORAL | Status: AC | PRN
  Administered 2023-03-08: 5 mg via ORAL

## 2023-03-08 MED ORDER — FAMOTIDINE 20 MG PO TABS
ORAL_TABLET | ORAL | Status: AC
  Filled 2023-03-08: qty 1

## 2023-03-08 MED ORDER — MIDAZOLAM HCL 2 MG/2ML IJ SOLN
INTRAMUSCULAR | Status: AC
  Filled 2023-03-08: qty 2

## 2023-03-08 MED ORDER — ONDANSETRON HCL 4 MG/2ML IJ SOLN
INTRAMUSCULAR | Status: DC | PRN
  Administered 2023-03-08: 4 mg via INTRAVENOUS

## 2023-03-08 MED ORDER — PROPOFOL 1000 MG/100ML IV EMUL
INTRAVENOUS | Status: AC
  Filled 2023-03-08: qty 100

## 2023-03-08 MED ORDER — ACETAMINOPHEN 10 MG/ML IV SOLN: 1000.0000 mg | Freq: Once | INTRAVENOUS | Status: DC | PRN

## 2023-03-08 MED ORDER — FENTANYL CITRATE (PF) 100 MCG/2ML IJ SOLN
25.0000 ug | INTRAMUSCULAR | Status: DC | PRN
  Administered 2023-03-08 (×2): 50 ug via INTRAVENOUS

## 2023-03-08 MED ORDER — CEFAZOLIN SODIUM-DEXTROSE 2-4 GM/100ML-% IV SOLN
INTRAVENOUS | Status: AC
  Filled 2023-03-08: qty 100

## 2023-03-08 MED ORDER — PROPOFOL 10 MG/ML IV BOLUS
INTRAVENOUS | Status: AC
  Filled 2023-03-08: qty 20

## 2023-03-08 MED ORDER — PROPOFOL 10 MG/ML IV BOLUS
INTRAVENOUS | Status: DC | PRN
  Administered 2023-03-08 (×3): 20 mg via INTRAVENOUS

## 2023-03-08 MED ORDER — FENTANYL CITRATE (PF) 100 MCG/2ML IJ SOLN
INTRAMUSCULAR | Status: DC | PRN
  Administered 2023-03-08 (×2): 50 ug via INTRAVENOUS

## 2023-03-08 MED ORDER — OXYCODONE HCL 5 MG PO TABS
ORAL_TABLET | ORAL | Status: AC
  Filled 2023-03-08: qty 1

## 2023-03-08 MED ORDER — LACTATED RINGERS IV SOLN: INTRAVENOUS | Status: DC | PRN

## 2023-03-08 MED ORDER — BUPIVACAINE HCL (PF) 0.5 % IJ SOLN
INTRAMUSCULAR | Status: AC
  Filled 2023-03-08: qty 30

## 2023-03-08 MED ORDER — 0.9 % SODIUM CHLORIDE (POUR BTL) OPTIME
TOPICAL | Status: DC | PRN
  Administered 2023-03-08: 500 mL

## 2023-03-08 MED ORDER — LIDOCAINE HCL (PF) 1 % IJ SOLN
INTRAMUSCULAR | Status: AC
  Filled 2023-03-08: qty 30

## 2023-03-08 MED ORDER — LACTATED RINGERS IV SOLN: INTRAVENOUS | Status: DC

## 2023-03-08 MED ORDER — CEFAZOLIN SODIUM-DEXTROSE 2-4 GM/100ML-% IV SOLN: 2.0000 g | INTRAVENOUS | Status: DC

## 2023-03-08 MED ORDER — OXYCODONE HCL 5 MG/5ML PO SOLN: 5.0000 mg | Freq: Once | ORAL | Status: AC | PRN

## 2023-03-08 SURGICAL SUPPLY — 42 items
APL PRP STRL LF DISP 70% ISPRP (MISCELLANEOUS)
BNDG CMPR 5X4 CHSV STRCH STRL (GAUZE/BANDAGES/DRESSINGS)
BNDG CMPR 5X4 KNIT ELC UNQ LF (GAUZE/BANDAGES/DRESSINGS)
BNDG CMPR 5X6 CHSV STRCH STRL (GAUZE/BANDAGES/DRESSINGS)
BNDG CMPR 75X21 PLY HI ABS (MISCELLANEOUS)
BNDG COHESIVE 4X5 TAN STRL LF (GAUZE/BANDAGES/DRESSINGS) IMPLANT
BNDG COHESIVE 6X5 TAN ST LF (GAUZE/BANDAGES/DRESSINGS) IMPLANT
BNDG ELASTIC 4INX 5YD STR LF (GAUZE/BANDAGES/DRESSINGS) IMPLANT
BNDG GAUZE DERMACEA FLUFF 4 (GAUZE/BANDAGES/DRESSINGS) ×1 IMPLANT
BNDG GZE 12X3 1 PLY HI ABS (GAUZE/BANDAGES/DRESSINGS) ×1
BNDG GZE DERMACEA 4 6PLY (GAUZE/BANDAGES/DRESSINGS) ×2
BNDG STRETCH GAUZE 3IN X12FT (GAUZE/BANDAGES/DRESSINGS) ×1 IMPLANT
CHLORAPREP W/TINT 26 (MISCELLANEOUS) ×1 IMPLANT
DRAPE C-ARMOR (DRAPES) ×1 IMPLANT
DRSG EMULSION OIL 3X3 NADH (GAUZE/BANDAGES/DRESSINGS) IMPLANT
ELECT REM PT RETURN 9FT ADLT (ELECTROSURGICAL)
ELECTRODE REM PT RTRN 9FT ADLT (ELECTROSURGICAL) ×1 IMPLANT
GAUZE SPONGE 4X4 12PLY STRL (GAUZE/BANDAGES/DRESSINGS) ×1 IMPLANT
GAUZE STRETCH 2X75IN STRL (MISCELLANEOUS) ×1 IMPLANT
GAUZE XEROFORM 1X8 LF (GAUZE/BANDAGES/DRESSINGS) ×1 IMPLANT
GLOVE BIO SURGEON STRL SZ7 (GLOVE) ×2 IMPLANT
GOWN STRL REUS W/ TWL LRG LVL3 (GOWN DISPOSABLE) ×1 IMPLANT
GOWN STRL REUS W/TWL LRG LVL3 (GOWN DISPOSABLE) ×1
KIT TURNOVER KIT A (KITS) ×1 IMPLANT
LABEL OR SOLS (LABEL) ×1 IMPLANT
MANIFOLD NEPTUNE II (INSTRUMENTS) ×1 IMPLANT
NDL FILTER BLUNT 18X1 1/2 (NEEDLE) ×1 IMPLANT
NDL HYPO 25X1 1.5 SAFETY (NEEDLE) ×1 IMPLANT
NEEDLE FILTER BLUNT 18X1 1/2 (NEEDLE) ×1
NEEDLE HYPO 25X1 1.5 SAFETY (NEEDLE) ×1
NS IRRIG 500ML POUR BTL (IV SOLUTION) ×1 IMPLANT
PACK EXTREMITY ARMC (MISCELLANEOUS) ×1 IMPLANT
PAD ABD DERMACEA PRESS 5X9 (GAUZE/BANDAGES/DRESSINGS) ×1 IMPLANT
PAD PREP OB/GYN DISP 24X41 (PERSONAL CARE ITEMS) ×1 IMPLANT
STOCKINETTE IMPERVIOUS 9X36 MD (GAUZE/BANDAGES/DRESSINGS) IMPLANT
SUT ETHILON 2 0 FS 18 (SUTURE) ×1 IMPLANT
SUT ETHILON 4-0 (SUTURE) ×1
SUT ETHILON 4-0 FS2 18XMFL BLK (SUTURE) ×1
SUTURE ETHLN 4-0 FS2 18XMF BLK (SUTURE) ×1 IMPLANT
SYR 10ML LL (SYRINGE) ×1 IMPLANT
TRAP FLUID SMOKE EVACUATOR (MISCELLANEOUS) ×1 IMPLANT
WATER STERILE IRR 500ML POUR (IV SOLUTION) ×1 IMPLANT

## 2023-03-08 NOTE — Brief Op Note (Signed)
03/08/2023  4:03 PM  PATIENT:  Becky Ward  67 y.o. female  PRE-OPERATIVE DIAGNOSIS:  CLOSED RIGHT TRIMALLEOLAR FRACTURE  POST-OPERATIVE DIAGNOSIS:  CLOSED RIGHT TRIMALLEOLAR FRACTURE  PROCEDURE:  Procedure(s): REMOVAL EXTERNAL FIXATION RIGHT ANKLE (Right)  SURGEON:  Surgeons and Role:    [name], DPM - Primary  PHYSICIAN ASSISTANT:   ASSISTANTS: none   ANESTHESIA:   local and MAC  EBL:  10mL   BLOOD ADMINISTERED:none  DRAINS: none   LOCAL MEDICATIONS USED:  MARCAINE   , LIDOCAINE , and Amount: 18 ml  SPECIMEN:  No Specimen  DISPOSITION OF SPECIMEN:  N/A  COUNTS:  YES  TOURNIQUET:  * No tourniquets in log *  DICTATION: .Dragon Dictation  PLAN OF CARE: Discharge to home after PACU  PATIENT DISPOSITION:  PACU - hemodynamically stable.   Delay start of Pharmacological VTE agent (>24hrs) due to surgical blood loss or risk of bleeding: not applicable  [name], DPM [facility]  Dr. [name], DPM    [address] [place], [place] [address]                Office [phone]  Fax [phone]

## 2023-03-08 NOTE — Op Note (Signed)
OPERATIVE REPORT Patient name: Becky [place] MRN: [id number] DOB: 01-05-1956  DOS: 03/08/23  Preop Dx: External fixator right lower extremity Postop Dx: same  Procedure:  1.  Removal of external fixation device right  Surgeon: [name] DPM  Anesthesia: 50-50 mixture of 2% lidocaine plain with 0.5% Marcaine plain totaling 18 mL infiltrated in the patient's right lower extremity  Hemostasis: None  EBL: 10 mL Materials: None Injectables: None Pathology: None  Condition: The patient tolerated the procedure and anesthesia well. No complications noted or reported   Justification for procedure: The patient is a 67 y.o. female who presents today for removal of external fixation device which was applied secondary to ORIF with external fixation of the right ankle fracture.The patient consented for removal. The patient consent form was reviewed. All patient questions were answered. No guarantees were expressed or implied. The patient and the surgeon both signed the patient consent form with the witness present and placed in the patient's chart.   Procedure in Detail: The patient was brought to the operating room remaining on the hospital bed in the supine position.  Attention was then directed to the surgical area where procedure number one commenced.  Procedure #1: Removal of external fixator right lower extremity After local anesthesia block the external fixator was loosened and the apparatus removed.  After removal of the monorail system the area was prepped with Betadine and the Schanz pins were removed in their entirety without complication.  Dry sterile compressive dressings were then applied to all previously mentioned incision sites about the patient's lower extremity. The patient was then transferred from the operating room to the recovery room having tolerated the procedure and anesthesia well. All vital signs are stable. After a brief stay in the recovery room the patient  was readmitted to inpatient room with postoperative orders placed.    IMPRESSION: Removal external fixation device without complication  Leave dressings clean dry and intact until follow-up in the office next week.  Continue nonweightbearing surgical extremity  [name], DPM [facility]  Dr. [name], DPM    [address] [place], [place] [address]                Office [phone]  Fax [phone]

## 2023-03-08 NOTE — Anesthesia Postprocedure Evaluation (Signed)
Anesthesia Post Note  Patient: Becky [place]  Procedure(s) Performed: REMOVAL EXTERNAL FIXATION RIGHT ANKLE (Right: Ankle)  Patient location during evaluation: PACU Anesthesia Type: General Level of consciousness: awake and alert Pain management: pain level controlled Vital Signs Assessment: post-procedure vital signs reviewed and stable Respiratory status: spontaneous breathing, nonlabored ventilation, respiratory function stable and patient connected to nasal cannula oxygen Cardiovascular status: blood pressure returned to baseline and stable Postop Assessment: no apparent nausea or vomiting Anesthetic complications: no   No notable events documented.   Last Vitals:  Vitals:   03/08/23 1650 03/08/23 1702  BP:  (!) 157/88  Pulse: 64 60  Resp: 13 16  Temp:  37.1 C  SpO2: 95% 98%    Last Pain:  Vitals:   03/08/23 1702  TempSrc: Temporal  PainSc: 0-No pain                 Cleda Mccreedy Ebone Alcivar

## 2023-03-08 NOTE — Discharge Instructions (Addendum)
Continue nonweightbearing surgical extremity.  Keep dressings clean dry and intact until follow-up in the office next week. Elevate as needed.   AMBULATORY SURGERY  DISCHARGE INSTRUCTIONS   The drugs that you were given will stay in your system until tomorrow so for the next 24 hours you should not:  Drive an automobile Make any legal decisions Drink any alcoholic beverage   You may resume regular meals tomorrow.  Today it is better to start with liquids and gradually work up to solid foods.  You may eat anything you prefer, but it is better to start with liquids, then soup and crackers, and gradually work up to solid foods.   Please notify your doctor immediately if you have any unusual bleeding, trouble breathing, redness and pain at the surgery site, drainage, fever, or pain not relieved by medication.    Additional Instructions:        Please contact your physician with any problems or Same Day Surgery at [phone], Monday through Friday 6 am to 4 pm, or [place] at [facility] number at [phone].

## 2023-03-08 NOTE — Anesthesia Preprocedure Evaluation (Addendum)
Anesthesia Evaluation  Patient identified by MRN, date of birth, ID band Patient awake    Reviewed: Allergy & Precautions, NPO status , Patient's Chart, lab work & pertinent test results  History of Anesthesia Complications Negative for: history of anesthetic complications  Airway Mallampati: IV   Neck ROM: Full    Dental  (+) Missing, Poor Dentition, Chipped, Loose   Pulmonary Current Smoker (1 ppd) and Patient abstained from smoking.   Pulmonary exam normal breath sounds clear to auscultation       Cardiovascular hypertension, Normal cardiovascular exam Rhythm:Regular Rate:Normal  ECG 01/14/23:  Sinus rhythm Nonspecific intraventricular conduction delay ST elevation, consider inferior injury   Neuro/Psych Alcohol use disorder, 3 beers daily; last intake 03/07/23    GI/Hepatic negative GI ROS,,,  Endo/Other  negative endocrine ROS    Renal/GU negative Renal ROS     Musculoskeletal  (+) Arthritis , Rheumatoid disorders,    Abdominal   Peds  Hematology  (+) Blood dyscrasia, anemia   Anesthesia Other Findings Past Medical History: No date: Benign essential HTN No date: Chronic gastritis without bleeding 01/17/2023: Closed fracture of right proximal humerus 01/17/2023: Closed trimalleolar fracture of right ankle No date: Colitis No date: Hypercholesterolemia No date: Hypokalemia No date: Mild anemia No date: Pulmonary nodules No date: Rheumatoid arthritis, seropositive (HCC) No date: Thrombocytosis   Reproductive/Obstetrics                             Anesthesia Physical Anesthesia Plan  ASA: 3  Anesthesia Plan: General   Post-op Pain Management:    Induction: Intravenous  PONV Risk Score and Plan: TIVA and Propofol infusion  Airway Management Planned: Natural Airway and Nasal Cannula  Additional Equipment:   Intra-op Plan:   Post-operative Plan:   Informed Consent:  I have reviewed the patients History and Physical, chart, labs and discussed the procedure including the risks, benefits and alternatives for the proposed anesthesia with the patient or authorized representative who has indicated his/her understanding and acceptance.     Dental Advisory Given  Plan Discussed with: Anesthesiologist, CRNA and Surgeon  Anesthesia Plan Comments: (Patient consented for risks of anesthesia including but not limited to:  - adverse reactions to medications - risk of airway placement if required - damage to eyes, teeth, lips or other oral mucosa - nerve damage due to positioning  - sore throat or hoarseness - Damage to heart, brain, nerves, lungs, other parts of body or loss of life  Patient voiced understanding.)        Anesthesia Quick Evaluation

## 2023-03-08 NOTE — Transfer of Care (Signed)
Immediate Anesthesia Transfer of Care Note  Patient: Becky Ward  Procedure(s) Performed: REMOVAL EXTERNAL FIXATION RIGHT ANKLE (Right: Ankle)  Patient Location: PACU  Anesthesia Type:MAC  Level of Consciousness: awake, alert , and oriented  Airway & Oxygen Therapy: Patient Spontanous Breathing  Post-op Assessment: Report given to RN, Post -op Vital signs reviewed and stable, and Patient moving all extremities X 4  Post vital signs: Reviewed and stable  Last Vitals:  Vitals Value Taken Time  BP 146/70 03/08/23 1545  Temp    Pulse 54 03/08/23 1549  Resp 11 03/08/23 1549  SpO2 99 % 03/08/23 1549  Vitals shown include unfiled device data.  Last Pain:  Vitals:   03/08/23 1320  TempSrc: Temporal         Complications: No notable events documented.

## 2023-03-08 NOTE — Interval H&P Note (Signed)
History and Physical Interval Note:  03/08/2023 2:46 PM  Becky Ward  has presented today for surgery, with the diagnosis of CLOSED RIGHT TRIMALLEOLAR FRACTURE.  The various methods of treatment have been discussed with the patient and family. After consideration of risks, benefits and other options for treatment, the patient has consented to  Procedure(s): REMOVAL EXTERNAL FIXATION RIGHT ANKLE (Right) as a surgical intervention.  The patient's history has been reviewed, patient examined, no change in status, stable for surgery.  I have reviewed the patient's chart and labs.  Questions were answered to the patient's satisfaction.     [name]

## 2023-03-11 ENCOUNTER — Encounter: Payer: Self-pay | Admitting: Podiatry

## 2023-03-15 ENCOUNTER — Ambulatory Visit (INDEPENDENT_AMBULATORY_CARE_PROVIDER_SITE_OTHER): Payer: Medicare Other

## 2023-03-15 ENCOUNTER — Ambulatory Visit (INDEPENDENT_AMBULATORY_CARE_PROVIDER_SITE_OTHER): Payer: Medicare Other | Admitting: Podiatry

## 2023-03-15 DIAGNOSIS — S82851A Displaced trimalleolar fracture of right lower leg, initial encounter for closed fracture: Secondary | ICD-10-CM | POA: Diagnosis not present

## 2023-03-15 NOTE — Progress Notes (Signed)
   Chief Complaint  Patient presents with   Routine Post Op    POV # 1 DOS 03/08/23 ---REMOVAL OF EXTERNAL FIXATION DEVICE RIGHT ANKLE    Subjective:  Patient presents today status post ORIF with additional external fixation right ankle fracture performed inpatient.  DOS: 01/17/2023.  Patient is NWB.  Continues to improve.  Patient is very relieved that the external fixator is now gone  Past Medical History:  Diagnosis Date   Benign essential HTN    Chronic gastritis without bleeding    Closed fracture of right proximal humerus 01/17/2023   Closed trimalleolar fracture of right ankle 01/17/2023   Colitis    Hypercholesterolemia    Hypokalemia    Mild anemia    Pulmonary nodules    Rheumatoid arthritis, seropositive (HCC)    Thrombocytosis     Past Surgical History:  Procedure Laterality Date   APPENDECTOMY     BREAST BIOPSY Left    CERVICAL CONE BIOPSY     COLONOSCOPY WITH ESOPHAGOGASTRODUODENOSCOPY (EGD)  04/17/2019   EXTERNAL FIXATION REMOVAL Right 03/08/2023   Procedure: REMOVAL EXTERNAL FIXATION RIGHT ANKLE;  Surgeon: [name], DPM;  Location: [facility];  Service: Orthopedics/Podiatry;  Laterality: Right;   NASAL SINUS SURGERY     ORIF ANKLE FRACTURE Right 01/17/2023   Procedure: OPEN REDUCTION INTERNAL FIXATION (ORIF) ANKLE FRACTURE;  Surgeon: [name], DPM;  Location: [facility];  Service: Orthopedics/Podiatry;  Laterality: Right;   OVARIAN CYST SURGERY      Allergies  Allergen Reactions   Banana Anaphylaxis and Hives   Ciprofloxacin Rash   Lactose Intolerance (Gi)    Codeine Rash    Causes itching and blisters     Objective/Physical Exam Neurovascular status intact.  All incisions healed.  No open wounds.  No clinical indication of infection.  There is some moderate edema noted especially to the forefoot.  Radiographic Exam RT ankle 02/12/2023:  Mostly unchanged.  Orthopedic hardware and osteotomies sites appear to be stable with routine healing.  Good  restoration of the tibiotalar joint and ankle mortise.  Assessment: 1. s/p ORIF with external fixation right ankle fracture. DOS: 01/17/2023 2. S/p removal external fixator.  DOS: 03/08/2023  Plan of Care:  -Patient was evaluated. X-rays reviewed - Cam boot dispensed.  Continue mostly NWB with the assistance of a walker, wheelchair, or cane -I did tell the patient that she may apply pressure in the boot minimally for short distances around the house -Return to clinic in 4 weeks.  If she is improving and doing well we will initiate physical therapy at this time    [name], DPM [facility]  Dr. [name], DPM    [address] [place], [place] [address]                Office [phone]  Fax [phone]

## 2023-03-19 ENCOUNTER — Other Ambulatory Visit: Payer: Self-pay | Admitting: Podiatry

## 2023-03-19 ENCOUNTER — Telehealth: Payer: Self-pay | Admitting: Podiatry

## 2023-03-19 NOTE — Telephone Encounter (Signed)
Pt is wanting a refill of pain medication. Pt uses [facility] in [place]

## 2023-03-20 ENCOUNTER — Other Ambulatory Visit: Payer: Self-pay | Admitting: Podiatry

## 2023-03-20 MED ORDER — OXYCODONE-ACETAMINOPHEN 5-325 MG PO TABS: 1.0000 | ORAL_TABLET | Freq: Four times a day (QID) | ORAL | 0 refills | Status: DC | PRN

## 2023-03-20 NOTE — Telephone Encounter (Signed)
Sent.  Thanks, Dr. Amalia Hailey

## 2023-03-20 NOTE — Progress Notes (Signed)
As needed

## 2023-03-20 NOTE — Telephone Encounter (Signed)
Called pt let her know RX sent in

## 2023-03-29 ENCOUNTER — Encounter: Payer: Medicare Other | Admitting: Podiatry

## 2023-04-02 ENCOUNTER — Telehealth: Payer: Self-pay | Admitting: Podiatry

## 2023-04-02 ENCOUNTER — Other Ambulatory Visit: Payer: Self-pay | Admitting: Podiatry

## 2023-04-02 MED ORDER — OXYCODONE-ACETAMINOPHEN 5-325 MG PO TABS: 1.0000 | ORAL_TABLET | Freq: Three times a day (TID) | ORAL | 0 refills | Status: DC | PRN

## 2023-04-02 NOTE — Progress Notes (Signed)
PRN pain 

## 2023-04-02 NOTE — Telephone Encounter (Signed)
Need a refill on pain meds.

## 2023-04-02 NOTE — Telephone Encounter (Signed)
Done. Thanks.

## 2023-04-03 ENCOUNTER — Telehealth: Payer: Self-pay | Admitting: Podiatry

## 2023-04-03 NOTE — Telephone Encounter (Signed)
Pt called asking for pain medication refill.

## 2023-04-04 NOTE — Telephone Encounter (Signed)
Sent a refill on 04/02/23. Thanks, Dr. Logan Bores

## 2023-04-12 ENCOUNTER — Encounter: Payer: Medicare Other | Admitting: Podiatry

## 2023-04-16 ENCOUNTER — Ambulatory Visit (INDEPENDENT_AMBULATORY_CARE_PROVIDER_SITE_OTHER): Payer: Medicare Other | Admitting: Podiatry

## 2023-04-16 ENCOUNTER — Encounter: Payer: Self-pay | Admitting: Podiatry

## 2023-04-16 DIAGNOSIS — S82851A Displaced trimalleolar fracture of right lower leg, initial encounter for closed fracture: Secondary | ICD-10-CM

## 2023-04-16 NOTE — Progress Notes (Signed)
   No chief complaint on file.   Subjective:  Patient presents today status post ORIF with additional external fixation right ankle fracture performed inpatient.  DOS: 01/17/2023.  Patient has been WBAT in the cam boot.  No new complaints  Past Medical History:  Diagnosis Date   Benign essential HTN    Chronic gastritis without bleeding    Closed fracture of right proximal humerus 01/17/2023   Closed trimalleolar fracture of right ankle 01/17/2023   Colitis    Hypercholesterolemia    Hypokalemia    Mild anemia    Pulmonary nodules    Rheumatoid arthritis, seropositive (HCC)    Thrombocytosis     Past Surgical History:  Procedure Laterality Date   APPENDECTOMY     BREAST BIOPSY Left    CERVICAL CONE BIOPSY     COLONOSCOPY WITH ESOPHAGOGASTRODUODENOSCOPY (EGD)  04/17/2019   EXTERNAL FIXATION REMOVAL Right 03/08/2023   Procedure: REMOVAL EXTERNAL FIXATION RIGHT ANKLE;  Surgeon: [name], DPM;  Location: [facility];  Service: Orthopedics/Podiatry;  Laterality: Right;   NASAL SINUS SURGERY     ORIF ANKLE FRACTURE Right 01/17/2023   Procedure: OPEN REDUCTION INTERNAL FIXATION (ORIF) ANKLE FRACTURE;  Surgeon: [name], DPM;  Location: [facility];  Service: Orthopedics/Podiatry;  Laterality: Right;   OVARIAN CYST SURGERY      Allergies  Allergen Reactions   Banana Anaphylaxis and Hives   Ciprofloxacin Rash   Lactose Intolerance (Gi)    Codeine Rash    Causes itching and blisters     Objective/Physical Exam Neurovascular status intact.  All incisions healed.  No open wounds.  No clinical indication of infection.  Limited range of motion with generalized weakness noted to the surgical extremity.  Radiographic Exam RT ankle 02/12/2023:  Mostly unchanged.  Orthopedic hardware and osteotomies sites appear to be stable with routine healing.  Good restoration of the tibiotalar joint and ankle mortise.  Assessment: 1. s/p ORIF with external fixation right ankle fracture. DOS:  01/17/2023 2. S/p removal external fixator.  DOS: 03/08/2023  Plan of Care:  -Patient was evaluated.  -Continue WBAT in the cam boot. Patient may now transition out of the cam boot into good supportive tennis shoes and sneakers.  Slow transition over the next 4-6 weeks -Order placed for physical therapy at [facility] -Return to clinic 2 months follow-up x-ray     [name], DPM [facility]  Dr. [name], DPM    [address] [place], [place] [address]                Office [phone]  Fax [phone]

## 2023-04-18 ENCOUNTER — Other Ambulatory Visit: Payer: Self-pay | Admitting: Podiatry

## 2023-04-18 ENCOUNTER — Telehealth: Payer: Self-pay | Admitting: Podiatry

## 2023-04-18 MED ORDER — OXYCODONE-ACETAMINOPHEN 5-325 MG PO TABS: 1.0000 | ORAL_TABLET | Freq: Three times a day (TID) | ORAL | 0 refills | Status: DC | PRN

## 2023-04-18 NOTE — Progress Notes (Signed)
PRN postop 

## 2023-04-18 NOTE — Telephone Encounter (Signed)
Pt called back and I relayed the message from Dr Logan Bores. She was sad because she has to stay in the boot but understood. She said thank you

## 2023-04-18 NOTE — Telephone Encounter (Signed)
Refill sent. Just stay in the CAM boot until physical therapy. They can help transition her out of the boot. Thanks, Dr. Logan Bores

## 2023-04-18 NOTE — Telephone Encounter (Signed)
Pt called asking for pain medication refill. Pt also has some questions because she states she doesn't have an appt until 11/15 with physical therapy. What should she do about the boot?

## 2023-04-18 NOTE — Telephone Encounter (Signed)
Called pt to relay message no answer LM on VM for pt to call us back for instructions per Dr Logan Bores.

## 2023-04-26 ENCOUNTER — Telehealth: Payer: Self-pay

## 2023-04-26 NOTE — Telephone Encounter (Signed)
Patient called and left a message. She is still having quite a bit of swelling and is concerned about it - unclear what exactly she needed/wanted. Called patient back and left a message to please call back to clarify what's going on with her.

## 2023-05-02 ENCOUNTER — Telehealth: Payer: Self-pay | Admitting: Podiatry

## 2023-05-02 NOTE — Telephone Encounter (Signed)
Patient called stating she does not like taking the pain pills and is in a great deal of pain she said she would like to be able to go outside but can't and she said her foot feels like it isn't attached to the ankle if it isn't elevated. Says when she lays down with it elevated it feels like her foot is dead. Stated if she takes any of the pain pills given she feels like she can't get out of bed and she can barley move. She's in pain and wants someone to call her ASAP, please advise   Thanks

## 2023-05-07 ENCOUNTER — Ambulatory Visit (INDEPENDENT_AMBULATORY_CARE_PROVIDER_SITE_OTHER): Payer: Medicare Other

## 2023-05-07 ENCOUNTER — Encounter: Payer: Self-pay | Admitting: Podiatry

## 2023-05-07 ENCOUNTER — Ambulatory Visit (INDEPENDENT_AMBULATORY_CARE_PROVIDER_SITE_OTHER): Payer: Medicare Other | Admitting: Podiatry

## 2023-05-07 VITALS — Ht 63.5 in | Wt 117.0 lb

## 2023-05-07 DIAGNOSIS — S82851A Displaced trimalleolar fracture of right lower leg, initial encounter for closed fracture: Secondary | ICD-10-CM

## 2023-05-07 DIAGNOSIS — G90521 Complex regional pain syndrome I of right lower limb: Secondary | ICD-10-CM

## 2023-05-07 MED ORDER — DULOXETINE HCL 20 MG PO CPEP: 20.0000 mg | ORAL_CAPSULE | Freq: Every day | ORAL | 3 refills | Status: DC

## 2023-05-07 MED ORDER — OXYCODONE-ACETAMINOPHEN 5-325 MG PO TABS: 1.0000 | ORAL_TABLET | Freq: Three times a day (TID) | ORAL | 0 refills | Status: DC | PRN

## 2023-05-07 NOTE — Progress Notes (Signed)
   No chief complaint on file.   Subjective:  Patient presents today status post ORIF with additional external fixation right ankle fracture performed inpatient.  DOS: 01/17/2023.  Continues to experience severe pain and tenderness even to light touch to the foot and ankle. She is very guarded to the foot.   Past Medical History:  Diagnosis Date   Benign essential HTN    Chronic gastritis without bleeding    Closed fracture of right proximal humerus 01/17/2023   Closed trimalleolar fracture of right ankle 01/17/2023   Colitis    Hypercholesterolemia    Hypokalemia    Mild anemia    Pulmonary nodules    Rheumatoid arthritis, seropositive (HCC)    Thrombocytosis     Past Surgical History:  Procedure Laterality Date   APPENDECTOMY     BREAST BIOPSY Left    CERVICAL CONE BIOPSY     COLONOSCOPY WITH ESOPHAGOGASTRODUODENOSCOPY (EGD)  04/17/2019   EXTERNAL FIXATION REMOVAL Right 03/08/2023   Procedure: REMOVAL EXTERNAL FIXATION RIGHT ANKLE;  Surgeon: [name], DPM;  Location: [facility];  Service: Orthopedics/Podiatry;  Laterality: Right;   NASAL SINUS SURGERY     ORIF ANKLE FRACTURE Right 01/17/2023   Procedure: OPEN REDUCTION INTERNAL FIXATION (ORIF) ANKLE FRACTURE;  Surgeon: [name], DPM;  Location: [facility];  Service: Orthopedics/Podiatry;  Laterality: Right;   OVARIAN CYST SURGERY      Allergies  Allergen Reactions   Banana Anaphylaxis and Hives   Ciprofloxacin Rash   Lactose Intolerance (Gi)    Codeine Rash    Causes itching and blisters     Objective/Physical Exam Neurovascular status intact.  All incisions healed.  No open wounds.  The foot is cold 'clammy' with heavy edema.  Pulses are palpable.  Capillary refill WNL to the digits.  This is concerning for CRPS onset  Radiographic Exam RT foot and ankle 05/07/2023:  Mottled 'washed out' appearance of the bones of the foot noted.  Radiographically favors onset of CRPS.  No acute fractures  identified  Assessment: 1. s/p ORIF with external fixation right ankle fracture. DOS: 01/17/2023 2. S/p removal external fixator.  DOS: 03/08/2023 3.  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  X-rays reviewed -Explained the pathology and etiology of CRPS to the patient.  Recommend daily massage at home with lotion to stimulate the foot and reduce the edema -Patient has her first appointment with physical therapy tomorrow, 05/08/2023 with [facility] -Refill prescription for Percocet 5/325 mg every 8 hours #21 -Prescription for Cymbalta 20 mg daily which has demonstrated effective for CRPS and reduction of pain -Return to clinic 6 weeks    [name], DPM [facility]  Dr. [name], DPM    [address] [place], [place] [address]                Office [phone]  Fax [phone]

## 2023-05-13 ENCOUNTER — Other Ambulatory Visit: Payer: Self-pay | Admitting: Podiatry

## 2023-05-13 ENCOUNTER — Encounter: Payer: Self-pay | Admitting: Podiatry

## 2023-05-13 DIAGNOSIS — S82851A Displaced trimalleolar fracture of right lower leg, initial encounter for closed fracture: Secondary | ICD-10-CM

## 2023-05-13 NOTE — Progress Notes (Signed)
Discussed the patient's case with colleagues.  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  Currently on Cymbalta 20 mg daily in combination with Percocet 5/325 mg Q8H PRN pain.   Patient also began physical therapy last week.  Referral placed today for pain management to evaluate the patient and see if they have any treatment options for her CRPS type I.   [name], DPM [facility]  Dr. [name], DPM    [address] [place], [place] [address]                Office [phone]  Fax [phone]

## 2023-05-22 ENCOUNTER — Telehealth: Payer: Self-pay | Admitting: Podiatry

## 2023-05-22 NOTE — Telephone Encounter (Signed)
Pt called asking if you could please refill her pain medication. She thought her appt was today but it is on 12/27.

## 2023-05-27 ENCOUNTER — Other Ambulatory Visit: Payer: Self-pay | Admitting: Podiatry

## 2023-05-27 MED ORDER — OXYCODONE-ACETAMINOPHEN 5-325 MG PO TABS: 1.0000 | ORAL_TABLET | Freq: Three times a day (TID) | ORAL | 0 refills | Status: DC | PRN

## 2023-05-27 NOTE — Telephone Encounter (Signed)
Refill sent. - Dr. Griffin Dewilde

## 2023-05-27 NOTE — Telephone Encounter (Signed)
Notified pt medication was sent in 

## 2023-06-21 ENCOUNTER — Ambulatory Visit: Payer: Medicare Other | Admitting: Podiatry

## 2023-07-09 ENCOUNTER — Encounter: Payer: Self-pay | Admitting: Podiatry

## 2023-07-09 ENCOUNTER — Ambulatory Visit (INDEPENDENT_AMBULATORY_CARE_PROVIDER_SITE_OTHER): Payer: Medicare Other

## 2023-07-09 ENCOUNTER — Ambulatory Visit (INDEPENDENT_AMBULATORY_CARE_PROVIDER_SITE_OTHER): Payer: Medicare Other | Admitting: Podiatry

## 2023-07-09 VITALS — Ht 63.5 in | Wt 117.0 lb

## 2023-07-09 DIAGNOSIS — S82851A Displaced trimalleolar fracture of right lower leg, initial encounter for closed fracture: Secondary | ICD-10-CM

## 2023-07-09 DIAGNOSIS — G90521 Complex regional pain syndrome I of right lower limb: Secondary | ICD-10-CM | POA: Diagnosis not present

## 2023-07-09 NOTE — Progress Notes (Signed)
 Chief Complaint  Patient presents with   Fracture    Closed right trimalleolar fracture F/U    Subjective:  Patient presents today status post ORIF with additional external fixation right ankle fracture performed inpatient.  DOS: 01/17/2023.  Continues to experience severe pain and tenderness even to light touch to the foot and ankle. She is very guarded to the foot.   Past Medical History:  Diagnosis Date   Benign essential HTN    Chronic gastritis without bleeding    Closed fracture of right proximal humerus 01/17/2023   Closed trimalleolar fracture of right ankle 01/17/2023   Colitis    Hypercholesterolemia    Hypokalemia    Mild anemia    Pulmonary nodules    Rheumatoid arthritis, seropositive (HCC)    Thrombocytosis     Past Surgical History:  Procedure Laterality Date   APPENDECTOMY     BREAST BIOPSY Left    CERVICAL CONE BIOPSY     COLONOSCOPY WITH ESOPHAGOGASTRODUODENOSCOPY (EGD)  04/17/2019   EXTERNAL FIXATION REMOVAL Right 03/08/2023   Procedure: REMOVAL EXTERNAL FIXATION RIGHT ANKLE;  Surgeon: Janit Thresa HERO, DPM;  Location: [facility];  Service: Orthopedics/Podiatry;  Laterality: Right;   NASAL SINUS SURGERY     ORIF ANKLE FRACTURE Right 01/17/2023   Procedure: OPEN REDUCTION INTERNAL FIXATION (ORIF) ANKLE FRACTURE;  Surgeon: Janit Thresa HERO, DPM;  Location: [facility];  Service: Orthopedics/Podiatry;  Laterality: Right;   OVARIAN CYST SURGERY      Allergies  Allergen Reactions   Banana Anaphylaxis and Hives   Ciprofloxacin  Rash   Lactose Intolerance (Gi)    Codeine Rash    Causes itching and blisters     Objective/Physical Exam Neurovascular status intact.  All incisions healed.  No open wounds.  The foot continues to be cold 'clammy' with heavy edema.  Pulses are palpable.  Capillary refill WNL to the digits.  Exquisite tenderness with light touch and palpation to the foot.  This is concerning for CRPS onset  Radiographic Exam RT foot and ankle 05/07/2023:   Intent severe mottled 'washed out' appearance of the bones of the foot noted consistent with Sudex atrophy.  Radiographically favors onset of CRPS.   Assessment: 1. s/p ORIF with external fixation right ankle fracture. DOS: 01/17/2023 2. S/p removal external fixator.  DOS: 03/08/2023 3.  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  X-rays reviewed -The patient is very frustrated with the pain in the foot and does not seem to understand why she is experiencing the pain to the foot -Due to transportation issues the patient has been unable to pursue physical therapy -A referral was also placed for pain management last visit.  According to the patient's chart they have reached out to her multiple times and left voicemails but she has not set up an appointment with them.  Their contact information was provided today for her to call and set up an appt -Continue prescription for Percocet 5/325 mg every 8 hours.  Refill provided last visit - Continue Cymbalta  20 mg daily  -Return to clinic 3 months   Thresa EMERSON Janit, DPM [facility]  Dr. Thresa EMERSON Janit, DPM    [address], [place] [address]  Office [phone]  Fax [phone]

## 2023-07-10 ENCOUNTER — Telehealth: Payer: Self-pay

## 2023-07-10 NOTE — Telephone Encounter (Signed)
 Patient called and left a message. She has an appointment at [facility] pain clinic, but they needed her referral before she could be seen. Faxed the referral, demographics and office note to [facility] pain [facility] @ [phone] via email. Confirmation received  Spoke to patient and notified. Thanks

## 2023-07-18 ENCOUNTER — Other Ambulatory Visit: Payer: Self-pay | Admitting: Podiatry

## 2023-07-18 ENCOUNTER — Telehealth: Payer: Self-pay | Admitting: Podiatry

## 2023-07-18 DIAGNOSIS — G90521 Complex regional pain syndrome I of right lower limb: Secondary | ICD-10-CM

## 2023-07-18 DIAGNOSIS — S82851A Displaced trimalleolar fracture of right lower leg, initial encounter for closed fracture: Secondary | ICD-10-CM

## 2023-07-18 NOTE — Telephone Encounter (Signed)
Pt called and has gotten her transportation issue fixed( daughter has moved back) she called [facility] and they stated they had discharged pt due to multiple no shows( pt states due to transportation). She is stating they need a new referral. Pt is asking did you want her to still continue the physical therapy? Please advise

## 2023-07-18 NOTE — Progress Notes (Signed)
New referral to physical therapy.   [name], DPM [facility]  Dr. [name], DPM    [address] [place], [place] [address]                Office [phone]  Fax [phone]

## 2023-07-18 NOTE — Telephone Encounter (Signed)
Left message for pt to call me back on my direct number as her phone did not state her name.

## 2023-07-19 NOTE — Telephone Encounter (Signed)
Notified pt what Dr Logan Bores said and that pain management is her highest priority and she said that they got the referral but they have to have the doctor review and will call her back.  I also told her you did send a referral to [facility] sports medicine for PT.

## 2023-07-22 ENCOUNTER — Other Ambulatory Visit: Payer: Self-pay

## 2023-07-22 ENCOUNTER — Inpatient Hospital Stay: Payer: Medicare Other | Admitting: Internal Medicine

## 2023-07-22 ENCOUNTER — Inpatient Hospital Stay: Payer: Medicare Other

## 2023-07-22 DIAGNOSIS — D75839 Thrombocytosis, unspecified: Secondary | ICD-10-CM

## 2023-07-23 ENCOUNTER — Telehealth: Payer: Self-pay | Admitting: Podiatry

## 2023-07-23 NOTE — Telephone Encounter (Signed)
Pt left message on my personal vm at the office returning a call.   I returned call and believe it was an old message from last week. She said she has an appt with sports medicine and waiting on pain management.

## 2023-07-24 ENCOUNTER — Ambulatory Visit: Payer: Medicare Other

## 2023-07-26 ENCOUNTER — Encounter: Payer: Self-pay | Admitting: Internal Medicine

## 2023-07-26 ENCOUNTER — Inpatient Hospital Stay (HOSPITAL_BASED_OUTPATIENT_CLINIC_OR_DEPARTMENT_OTHER): Payer: Medicare Other | Admitting: Internal Medicine

## 2023-07-26 ENCOUNTER — Inpatient Hospital Stay: Payer: Medicare Other | Attending: Internal Medicine

## 2023-07-26 DIAGNOSIS — R918 Other nonspecific abnormal finding of lung field: Secondary | ICD-10-CM | POA: Diagnosis not present

## 2023-07-26 DIAGNOSIS — D649 Anemia, unspecified: Secondary | ICD-10-CM | POA: Insufficient documentation

## 2023-07-26 DIAGNOSIS — F17210 Nicotine dependence, cigarettes, uncomplicated: Secondary | ICD-10-CM | POA: Insufficient documentation

## 2023-07-26 DIAGNOSIS — M069 Rheumatoid arthritis, unspecified: Secondary | ICD-10-CM | POA: Insufficient documentation

## 2023-07-26 DIAGNOSIS — D75839 Thrombocytosis, unspecified: Secondary | ICD-10-CM | POA: Insufficient documentation

## 2023-07-26 LAB — CMP (CANCER CENTER ONLY)
ALT: 28 U/L (ref 0–44)
AST: 36 U/L (ref 15–41)
Albumin: 3.8 g/dL (ref 3.5–5.0)
Alkaline Phosphatase: 73 U/L (ref 38–126)
Anion gap: 10 (ref 5–15)
BUN: 11 mg/dL (ref 8–23)
CO2: 23 mmol/L (ref 22–32)
Calcium: 8.6 mg/dL — ABNORMAL LOW (ref 8.9–10.3)
Chloride: 103 mmol/L (ref 98–111)
Creatinine: 0.52 mg/dL (ref 0.44–1.00)
GFR, Estimated: >60 mL/min (ref >60)
Glucose, Bld: 90 mg/dL (ref 70–99)
Potassium: 3.3 mmol/L — ABNORMAL LOW (ref 3.5–5.1)
Sodium: 136 mmol/L (ref 135–145)
Total Bilirubin: 0.3 mg/dL (ref 0.0–1.2)
Total Protein: 6.8 g/dL (ref 6.5–8.1)

## 2023-07-26 LAB — CBC WITH DIFFERENTIAL (CANCER CENTER ONLY)
Abs Immature Granulocytes: 0.03 10*3/uL (ref 0.00–0.07)
Basophils Absolute: 0 10*3/uL (ref 0.0–0.1)
Basophils Relative: 1 %
Eosinophils Absolute: 0.3 10*3/uL (ref 0.0–0.5)
Eosinophils Relative: 5 %
HCT: 34.9 % — ABNORMAL LOW (ref 36.0–46.0)
Hemoglobin: 11.3 g/dL — ABNORMAL LOW (ref 12.0–15.0)
Immature Granulocytes: 1 %
Lymphocytes Relative: 31 %
Lymphs Abs: 1.9 10*3/uL (ref 0.7–4.0)
MCH: 31.7 pg (ref 26.0–34.0)
MCHC: 32.4 g/dL (ref 30.0–36.0)
MCV: 98 fL (ref 80.0–100.0)
Monocytes Absolute: 0.4 10*3/uL (ref 0.1–1.0)
Monocytes Relative: 7 %
Neutro Abs: 3.5 10*3/uL (ref 1.7–7.7)
Neutrophils Relative %: 55 %
Platelet Count: 453 10*3/uL — ABNORMAL HIGH (ref 150–400)
RBC: 3.56 MIL/uL — ABNORMAL LOW (ref 3.87–5.11)
RDW: 15 % (ref 11.5–15.5)
WBC Count: 6.2 10*3/uL (ref 4.0–10.5)
nRBC: 0 % (ref 0.0–0.2)

## 2023-07-26 LAB — LACTATE DEHYDROGENASE: LDH: 148 U/L (ref 98–192)

## 2023-07-26 NOTE — Progress Notes (Signed)
[facility] CONSULT NOTE  Patient Care Team: Margit Hanks, MD as PCP - General (Internal Medicine) Earna Coder, MD as Consulting Physician (Oncology)  CHIEF COMPLAINTS/PURPOSE OF CONSULTATION: Thrombocytosis.   HEMATOLOGY HISTORY  # THROMBOCYTOSIS [platelets- 450-500; since 2022; Hb-13; white count-6.8]; MPN work-up negative/BCR ABL negative  #Active smoker/lung cancer screening program-PCP -[facility].    HISTORY OF PRESENTING ILLNESS: Ambulating independently with a wheel chair. Accompanied by  daughter.   Becky Ward 68 y.o.  female pleasant patient is here for follow-up elevated platelets likely reactive sec to smoking/reactive is here for a follow up.   Patient recently broke her left ankle.  Status post ORIF.  Currently in a wheelchair Unable to stand. Now has a pain syndrome. Pain 3/10, worse if standing   Appetite is improving. Energy also improving. Arthritis pain is better on prednisone and methotrexate   Patient unfortunately continues to smoke.  She has chronic joint pains.  Continues to have chronic joint pains.  No new shortness of breath or cough.  No strokes.  Review of Systems  Constitutional:  Positive for malaise/fatigue. Negative for chills, diaphoresis, fever and weight loss.  HENT:  Negative for nosebleeds and sore throat.   Eyes:  Negative for double vision.  Respiratory:  Negative for cough, hemoptysis, sputum production, shortness of breath and wheezing.   Cardiovascular:  Negative for chest pain, palpitations, orthopnea and leg swelling.  Gastrointestinal:  Negative for abdominal pain, blood in stool, constipation, diarrhea, heartburn, melena, nausea and vomiting.  Genitourinary:  Negative for dysuria, frequency and urgency.  Musculoskeletal:  Positive for back pain, joint pain and myalgias.  Skin: Negative.  Negative for itching and rash.  Neurological:  Positive for tingling. Negative for dizziness, focal weakness, weakness  and headaches.  Endo/Heme/Allergies:  Does not bruise/bleed easily.  Psychiatric/Behavioral:  Negative for depression. The patient is not nervous/anxious and does not have insomnia.      MEDICAL HISTORY:  Past Medical History:  Diagnosis Date   Benign essential HTN    Chronic gastritis without bleeding    Closed fracture of right proximal humerus 01/17/2023   Closed trimalleolar fracture of right ankle 01/17/2023   Colitis    Hypercholesterolemia    Hypokalemia    Mild anemia    Pulmonary nodules    Rheumatoid arthritis, seropositive (HCC)    Thrombocytosis     SURGICAL HISTORY: Past Surgical History:  Procedure Laterality Date   APPENDECTOMY     BREAST BIOPSY Left    CERVICAL CONE BIOPSY     COLONOSCOPY WITH ESOPHAGOGASTRODUODENOSCOPY (EGD)  04/17/2019   EXTERNAL FIXATION REMOVAL Right 03/08/2023   Procedure: REMOVAL EXTERNAL FIXATION RIGHT ANKLE;  Surgeon: [name], DPM;  Location: [facility] ORS;  Service: Orthopedics/Podiatry;  Laterality: Right;   NASAL SINUS SURGERY     ORIF ANKLE FRACTURE Right 01/17/2023   Procedure: OPEN REDUCTION INTERNAL FIXATION (ORIF) ANKLE FRACTURE;  Surgeon: [name], DPM;  Location: [facility] ORS;  Service: Orthopedics/Podiatry;  Laterality: Right;   OVARIAN CYST SURGERY      SOCIAL HISTORY: Social History   Socioeconomic History   Marital status: Divorced    Spouse name: Not on file   Number of children: Not on file   Years of education: Not on file   Highest education level: Not on file  Occupational History   Not on file  Tobacco Use   Smoking status: Every Day    Current packs/day: 0.50    Types: Cigarettes   Smokeless tobacco:  Never  Vaping Use   Vaping status: Never Used  Substance and Sexual Activity   Alcohol use: Yes    Comment: daily beer   Drug use: Not Currently   Sexual activity: Not Currently  Other Topics Concern   Not on file  Social History Narrative   Alcohol- 3-4 beers-day/smoking: 1ppd; lives in  [address]; [place]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;    Lives alone   Social Drivers of Health   Financial Resource Strain: High Risk (06/08/2022)   Received from [facility], [facility]   Overall Financial Resource Strain (CARDIA)    Difficulty of Paying Living Expenses: Very hard  Food Insecurity: Food Insecurity Present (01/15/2023)   Hunger Vital Sign    Worried About Running Out of Food in the Last Year: Often true    Ran Out of Food in the Last Year: Often true  Transportation Needs: Unmet Transportation Needs (01/15/2023)   PRAPARE - Administrator, Civil Service (Medical): Yes    Lack of Transportation (Non-Medical): No  Physical Activity: Inactive (06/08/2022)   Received from [facility], [facility]   Exercise Vital Sign    Days of Exercise per Week: 0 days    Minutes of Exercise per Session: 0 min  Stress: Stress Concern Present (06/08/2022)   Received from [facility], [facility]   [employer] of Occupational Health - Occupational Stress Questionnaire    Feeling of Stress : Rather much  Social Connections: Unknown (06/08/2022)   Received from [facility]   Social Connection and Isolation Panel [NHANES]    Frequency of Communication with Friends and Family: More than three times a week    Frequency of Social Gatherings with Friends and Family: More than three times a week    Attends Religious Services: Never    Database administrator or Organizations: No    Attends Banker Meetings: Never    Marital Status: Not on file  Recent Concern: Social Connections - Socially Isolated (06/08/2022)   Received from [facility]   Social Connection and Isolation Panel [NHANES]    Frequency of Communication with Friends and Family: More than three times a week    Frequency of Social Gatherings  with Friends and Family: More than three times a week    Attends Religious Services: Never    Database administrator or Organizations: No    Attends Banker Meetings: Never    Marital Status: Divorced  Catering manager Violence: Not At Risk (01/15/2023)   Humiliation, Afraid, Rape, and Kick questionnaire    Fear of Current or Ex-Partner: No    Emotionally Abused: No    Physically Abused: No    Sexually Abused: No    FAMILY HISTORY: Family History  Problem Relation Age of Onset   Leukemia Father     ALLERGIES:  is allergic to banana, ciprofloxacin, lactose intolerance (gi), and codeine.  MEDICATIONS:  Current Outpatient Medications  Medication Sig Dispense Refill   Aspirin-Acetaminophen-Caffeine (GOODY HEADACHE PO) Take 4 tablets by mouth daily.     Cholecalciferol (VITAMIN D3) 50 MCG (2000 UT) CHEW Chew 2 tablets by mouth daily.     DULoxetine (CYMBALTA) 20 MG capsule Take 1 capsule (20 mg total) by mouth daily. 60 capsule 3   folic acid (FOLVITE) 1 MG tablet Take 1 mg by mouth daily.  lisinopril-hydrochlorothiazide (ZESTORETIC) 20-12.5 MG tablet Take 0.5 tablets by mouth daily.     LUMIGAN 0.01 % SOLN Place 1 drop into both eyes at bedtime.     methotrexate (RHEUMATREX) 2.5 MG tablet Take 2.5 mg by mouth once a week. Take 8 tablets weekly (Thursday)     Multiple Vitamins-Minerals (CENTRUM SILVER WOMEN 50+ PO) Take 1 tablet by mouth daily.     Multiple Vitamins-Minerals (STRESS B COMPLEX/ANTIOXID/ZINC PO) Take 1 tablet by mouth daily.     pravastatin (PRAVACHOL) 20 MG tablet Take 1 tablet by mouth at bedtime.     potassium chloride SA (KLOR-CON M) 20 MEQ tablet Take 20 mEq by mouth daily. (Patient not taking: Reported on 07/26/2023)     No current facility-administered medications for this visit.     PHYSICAL EXAMINATION:   Vitals:   07/26/23 1317  BP: 108/83  Pulse: 70  Temp: (!) 97.3 F (36.3 C)  SpO2: 97%    Filed Weights     Physical  Exam Vitals and nursing note reviewed.  HENT:     Head: Normocephalic and atraumatic.     Mouth/Throat:     Pharynx: Oropharynx is clear.  Eyes:     Extraocular Movements: Extraocular movements intact.     Pupils: Pupils are equal, round, and reactive to light.  Cardiovascular:     Rate and Rhythm: Normal rate and regular rhythm.  Pulmonary:     Comments: Decreased breath sounds bilaterally.  Abdominal:     Palpations: Abdomen is soft.  Musculoskeletal:        General: Normal range of motion.     Cervical back: Normal range of motion.  Skin:    General: Skin is warm.  Neurological:     General: No focal deficit present.     Mental Status: She is alert and oriented to person, place, and time.  Psychiatric:        Behavior: Behavior normal.        Judgment: Judgment normal.      LABORATORY DATA:  I have reviewed the data as listed Lab Results  Component Value Date   WBC 6.2 07/26/2023   HGB 11.3 (L) 07/26/2023   HCT 34.9 (L) 07/26/2023   MCV 98.0 07/26/2023   PLT 453 (H) 07/26/2023   Recent Labs    01/14/23 1840 01/15/23 1404 01/16/23 0511 01/17/23 0638 01/18/23 0332 03/08/23 1330 07/26/23 1305  NA 138   < > 135  --  136 141 136  K 3.3*   < > 4.4   < > 3.5 3.6 3.3*  CL 106   < > 108  --  103 106 103  CO2 19*   < > 22  --  27  --  23  GLUCOSE 104*   < > 100*  --  213* 86 90  BUN 10   < > 11  --  14 16 11   CREATININE 0.74   < > 0.53  --  0.48 0.60 0.52  CALCIUM 8.2*   < > 8.0*  --  8.2*  --  8.6*  GFRNONAA >60   < > >60  --  >60  --  >60  PROT 6.7  --   --   --   --   --  6.8  ALBUMIN 4.1  --   --   --   --   --  3.8  AST 30  --   --   --   --   --  36  ALT 21  --   --   --   --   --  28  ALKPHOS 56  --   --   --   --   --  73  BILITOT 0.3  --   --   --   --   --  0.3   < > = values in this interval not displayed.     DG Ankle Complete Right Result Date: 07/09/2023 Please see detailed radiograph report in office note.   ASSESSMENT & PLAN:    Thrombocytosis # Thrombocytosis MILD [450-500]-clinically suggestive of reactive rather than essential thrombocytosis.  Patient is asymptomatic.  Patient blood work including JAK2 mutation/CALR/MPL-negative.  BCR ABL is negative.  Peripheral blood review negative for any major concerns. Recommend baby asprin 81mg /day.   # JAN 2025- 453  suspect patient's thrombocytosis is reactive rather than any malignant process. Again discussed re: Bone marrow biopsy. But given the overall stability of plaetlets- hold off for a bone marrow biopsy at this time. Question related to smoking.-See below  # Mild anemia: Hb 11.1- colo 3 years ago- over all stable.   # RA- on MXT/ prednisone- stable.   # Lung nodules:  Reviewed- [facility] JULY 2022- CT scan- Numerous bilateral solid and groundglass pulmonary nodules; New 6 mm part solid pulmonary nodule in the right middle lobe.  Again reminded to the patient regarding follow-up of lung cancer screening program.  Patient states that she will call her PCP to schedule scans.    # Active smoker: Discussed with the patient regarding the ill effects of smoking- including but not limited to cardiac lung and vascular diseases and malignancies.  Patient is in the process of quit smoking.  Again discussed that smoking could because of her elevated platelets.   # DISPOSITION: # follow up as needed- Dr.B        Earna Coder, MD 07/26/2023 1:52 PM

## 2023-07-26 NOTE — Assessment & Plan Note (Addendum)
#   Thrombocytosis MILD [450-500]-clinically suggestive of reactive rather than essential thrombocytosis.  Patient is asymptomatic.  Patient blood work including JAK2 mutation/CALR/MPL-negative.  BCR ABL is negative.  Peripheral blood review negative for any major concerns. Recommend baby asprin 81mg /day.   # JAN 2025- 453  suspect patient's thrombocytosis is reactive rather than any malignant process. Again discussed re: Bone marrow biopsy. But given the overall stability of plaetlets- hold off for a bone marrow biopsy at this time. Question related to smoking.-See below  # Mild anemia: Hb 11.1- colo 3 years ago- over all stable.   # RA- on MXT/ prednisone- stable.   # Lung nodules:  Reviewed- [facility] JULY 2022- CT scan- Numerous bilateral solid and groundglass pulmonary nodules; New 6 mm part solid pulmonary nodule in the right middle lobe.  Again reminded to the patient regarding follow-up of lung cancer screening program.  Patient states that she will call her PCP to schedule scans.    # Active smoker: Discussed with the patient regarding the ill effects of smoking- including but not limited to cardiac lung and vascular diseases and malignancies.  Patient is in the process of quit smoking.  Again discussed that smoking could because of her elevated platelets.   # DISPOSITION: # follow up as needed- Dr.B

## 2023-07-26 NOTE — Progress Notes (Signed)
Fatigue: no Headaches:  yes Joint pain: no Bleeding from gums/nose: no  Broke right ankle, in a boot, 7/24. Unable to stand. Now has a pain syndrome. Pain 3/10, worse if standing.

## 2023-07-30 ENCOUNTER — Telehealth: Payer: Self-pay | Admitting: Podiatry

## 2023-07-30 NOTE — Telephone Encounter (Signed)
Patient is requesting to get prescription for crutches. Patient contact telephone number, [phone]

## 2023-07-31 ENCOUNTER — Other Ambulatory Visit: Payer: Self-pay

## 2023-07-31 ENCOUNTER — Other Ambulatory Visit: Payer: Self-pay | Admitting: Podiatry

## 2023-07-31 DIAGNOSIS — S82851A Displaced trimalleolar fracture of right lower leg, initial encounter for closed fracture: Secondary | ICD-10-CM

## 2023-07-31 DIAGNOSIS — G90521 Complex regional pain syndrome I of right lower limb: Secondary | ICD-10-CM

## 2023-08-09 ENCOUNTER — Ambulatory Visit: Payer: Medicare Other | Attending: Podiatry

## 2023-08-14 ENCOUNTER — Ambulatory Visit: Payer: Medicare Other

## 2023-08-16 ENCOUNTER — Ambulatory Visit: Payer: Medicare Other

## 2023-09-05 NOTE — Therapy (Signed)
 OUTPATIENT PHYSICAL THERAPY LOWER EXTREMITY EVALUATION   Patient Name: Becky Ward MRN: [id number] DOB:[birth year], 68 y.o., female Today's Date: 09/06/2023  END OF SESSION:  PT End of Session - 09/06/23 0906     Visit Number 1    Number of Visits 17    Date for PT Re-Evaluation 11/01/23    Progress Note Due on Visit 10    PT Start Time 0906    PT Stop Time 0942    PT Time Calculation (min) 36 min    Activity Tolerance Patient tolerated treatment well    Behavior During Therapy [facility] for tasks assessed/performed             Past Medical History:  Diagnosis Date   Benign essential HTN    Chronic gastritis without bleeding    Closed fracture of right proximal humerus 01/17/2023   Closed trimalleolar fracture of right ankle 01/17/2023   Colitis    Hypercholesterolemia    Hypokalemia    Mild anemia    Pulmonary nodules    Rheumatoid arthritis, seropositive (HCC)    Thrombocytosis    Past Surgical History:  Procedure Laterality Date   APPENDECTOMY     BREAST BIOPSY Left    CERVICAL CONE BIOPSY     COLONOSCOPY WITH ESOPHAGOGASTRODUODENOSCOPY (EGD)  04/17/2019   EXTERNAL FIXATION REMOVAL Right 03/08/2023   Procedure: REMOVAL EXTERNAL FIXATION RIGHT ANKLE;  Surgeon: [name], DPM;  Location: [facility] ORS;  Service: Orthopedics/Podiatry;  Laterality: Right;   NASAL SINUS SURGERY     ORIF ANKLE FRACTURE Right 01/17/2023   Procedure: OPEN REDUCTION INTERNAL FIXATION (ORIF) ANKLE FRACTURE;  Surgeon: [name], DPM;  Location: [facility] ORS;  Service: Orthopedics/Podiatry;  Laterality: Right;   OVARIAN CYST SURGERY     Patient Active Problem List   Diagnosis Date Noted   Malnutrition of moderate degree 01/16/2023   Closed right trimalleolar fracture, initial encounter 01/14/2023   Alcohol use disorder 01/14/2023   Closed fracture of right proximal humerus 01/14/2023   Alcohol intoxication (HCC) 01/14/2023   Hypokalemia 01/14/2023   Closed displaced fracture of proximal  phalanx of left little finger 08/22/2021   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   Thrombocytosis 04/24/2021   Elevated alkaline phosphatase level 04/07/2021   Benign essential HTN 05/13/2020   History of subarachnoid hemorrhage 03/09/2020   Chronic gastritis without bleeding 04/23/2019   Anemia, mild 09/01/2018   Hypercholesterolemia 09/01/2018   Low vitamin D level 09/01/2018   Pulmonary nodule 07/26/2017   Tobacco abuse 03/16/2014    PCP: Margit Hanks, MD  REFERRING PROVIDER: [name], DPM  REFERRING DIAG:  [phone] (ICD-10-CM) - Complex regional pain syndrome type 1 of right lower extremity S82.851A (ICD-10-CM) - Closed right trimalleolar fracture, initial encounter  THERAPY DIAG:  Pain in right ankle and joints of right foot  Stiffness of right ankle, not elsewhere classified  Muscle weakness (generalized)  Other abnormalities of gait and mobility  Rationale for Evaluation and Treatment: Rehabilitation  ONSET DATE: 01/17/23  SUBJECTIVE:   SUBJECTIVE STATEMENT: Patient unsure of true story of how she fractured her ankle. She reports that the hospital says a dog's leash wrapped around her foot and caused her to fall and sustained R ankle fracture and R shoulder dislocation and fracture. She states that she was drinking when it happened. MD has cleared her for full weightbearing but she is unable to bear weight through the R LE due to significant pain from CRPS. She is using the w/c for primary mobility and  utilizes stand pivot transfer to get to/from w/c to various surfaces.   PERTINENT HISTORY: S/p ORIF with additional external fixation of R ankle fracture on 01/17/23 with removal of external fixator on 03/08/23. She had an onset of CRPS type 1 in R foot.  PMH: HTN, RA PAIN:  Are you having pain? No - at worst 9/10   PRECAUTIONS: Fall  RED FLAGS: None   WEIGHT BEARING RESTRICTIONS: No  FALLS:  Has patient fallen in last 6 months? No  LIVING  ENVIRONMENT: Lives with: lives with their daughter Lives in: House/apartment Stairs: 5 stairs and she scoots up backwards on her buttocks  Has following equipment at home: Environmental consultant - 2 wheeled, Wheelchair (manual), and Tour manager  OCCUPATION: retired   PLOF: Needs assistance with transfers - independent within the home but requires assistance to get in/out of the truck   PATIENT GOALS: to be able to get in my truck and walk    OBJECTIVE:  Note: Objective measures were completed at Evaluation unless otherwise noted.  DIAGNOSTIC FINDINGS: N/A  PATIENT SURVEYS:  LEFS to be completed visit #2  COGNITION: Overall cognitive status: Within functional limits for tasks assessed     SENSATION: Light touch: Impaired  and hypersensitive    POSTURE: rounded shoulders and forward head  PALPATION: Severe tenderness to palpation to R ankle, forefoot, and toes  LOWER EXTREMITY ROM:  Active ROM Right eval Left eval  Hip flexion    Hip extension    Hip abduction    Hip adduction    Hip internal rotation    Hip external rotation    Knee flexion    Knee extension    Ankle dorsiflexion  -20  Ankle plantarflexion  30  Ankle inversion  20  Ankle eversion  10   (Blank rows = not tested)  LOWER EXTREMITY MMT:   Not tested due to poor tolerance  MMT Right eval Left eval  Hip flexion    Hip extension    Hip abduction    Hip adduction    Hip internal rotation    Hip external rotation    Knee flexion    Knee extension    Ankle dorsiflexion    Ankle plantarflexion    Ankle inversion    Ankle eversion     (Blank rows = not tested)  FUNCTIONAL TESTS:  30 seconds chair stand test - to be completed in subsequent visits   GAIT: Distance walked: 0' Comments: standing not tested this session due to increased pain with ROM testing                                                                                                                                 TREATMENT DATE:  09/06/23   Education provided on HEP and desensitization techniques for pain modulation.    PATIENT EDUCATION:  Education details: HEP, goals, POC Person educated: Patient Education method: Explanation, Demonstration, and Handouts  Education comprehension: verbalized understanding and returned demonstration  HOME EXERCISE PROGRAM: Access Code: [email or web address] URL: [email or web address] Date: 09/06/2023 Prepared by: Maylon Peppers  Exercises - Supine Ankle Pumps  - 2-3 x daily - 5-7 x weekly - 3 sets - 10 reps - Supine Ankle Inversion Eversion AROM  - 2-3 x daily - 5-7 x weekly - 3 sets - 10 reps - Long Sitting Calf Stretch with Strap  - 2-3 x daily - 5-7 x weekly - 3-5 reps - Towel Scrunches  - 2-3 x daily - 5-7 x weekly - 3 sets - 10 reps  ASSESSMENT:  CLINICAL IMPRESSION: Patient is a 68 y.o. female who was seen today for physical therapy evaluation and treatment following R ankle ORIF with development of CRPS in R LE. Patient with significant pain with minimal palpation of R ankle. Demonstrates decreased R ankle AROM and tolerates minimal PROM to ankle. Educated patient on HEP and desensitization techniques for R ankle, patient verbalized understanding. HEP focused on gentle ROM and stretching to slowly introduce movement to R ankle within tolerance. Patient will benefit from skilled PT interventions to address listed impairments to improve QOL and return to PLOF.   OBJECTIVE IMPAIRMENTS: Abnormal gait, cardiopulmonary status limiting activity, decreased activity tolerance, decreased balance, decreased endurance, decreased knowledge of condition, decreased knowledge of use of DME, decreased mobility, difficulty walking, decreased ROM, decreased strength, hypomobility, impaired flexibility, impaired sensation, and pain.   ACTIVITY LIMITATIONS: standing, squatting, stairs, transfers, and locomotion level  PARTICIPATION LIMITATIONS: driving, community activity, and yard  work  PERSONAL FACTORS: Age, Behavior pattern, Past/current experiences, Time since onset of injury/illness/exacerbation, and Transportation are also affecting patient's functional outcome.   REHAB POTENTIAL: Fair    CLINICAL DECISION MAKING: Evolving/moderate complexity  EVALUATION COMPLEXITY: High   GOALS: Goals reviewed with patient? Yes  SHORT TERM GOALS: Target date: 10/04/2023  Patient will be independent in HEP to improve strength/mobility for better functional independence with ADLs. Baseline: Goal status: INITIAL   LONG TERM GOALS: Target date: 11/01/2023   Patient will increase BLE gross strength to 4+/5 as to improve functional strength for independent gait, increased standing tolerance and increased ADL ability. Baseline: 3/14: unable to be tested  Goal status: INITIAL  2.  Patient will tolerate standing x 5 minutes with LRAD and modI to improve tolerance to weightbearing on R LE.  Baseline: 3/14: not tested on eval due to hypersensitivity  Goal status: INITIAL  3.  Patient will ambulate 62' with LRAD modI with minimal gait deviations to improve household ambulator ability.  Baseline:  Goal status: INITIAL  4. Patient will improve R ankle DF ROM to neutral to improve gait mechanics with step through gait.  Baseline:  Goal status: INITIAL   PLAN:  PT FREQUENCY: 1-2x/week  PT DURATION: 8 weeks  PLANNED INTERVENTIONS: 97164- PT Re-evaluation, 97110-Therapeutic exercises, 97530- Therapeutic activity, 97112- Neuromuscular re-education, [phone]- Self Care, 11914- Manual therapy, [phone]- Gait training, [phone]- Orthotic Fit/training, [phone]- Aquatic Therapy, [phone]- Electrical stimulation (manual), Patient/Family education, Balance training, Stair training, Taping, Dry Needling, Joint mobilization, Joint manipulation, DME instructions, Wheelchair mobility training, Cryotherapy, and Moist heat  PLAN FOR NEXT SESSION: LEFS or other more appropriate outcome measure,  desensitization techniques, gentle ROM, attempt standing on visit #2   Maylon Peppers, PT, DPT Physical Therapist - [facility]  [facility] 09/06/2023, 10:33 AM

## 2023-09-06 ENCOUNTER — Ambulatory Visit: Payer: Medicare Other | Attending: Podiatry

## 2023-09-06 DIAGNOSIS — G90521 Complex regional pain syndrome I of right lower limb: Secondary | ICD-10-CM | POA: Insufficient documentation

## 2023-09-06 DIAGNOSIS — M25671 Stiffness of right ankle, not elsewhere classified: Secondary | ICD-10-CM | POA: Diagnosis present

## 2023-09-06 DIAGNOSIS — S82851A Displaced trimalleolar fracture of right lower leg, initial encounter for closed fracture: Secondary | ICD-10-CM | POA: Diagnosis not present

## 2023-09-06 DIAGNOSIS — R2689 Other abnormalities of gait and mobility: Secondary | ICD-10-CM | POA: Diagnosis present

## 2023-09-06 DIAGNOSIS — M25571 Pain in right ankle and joints of right foot: Secondary | ICD-10-CM | POA: Diagnosis present

## 2023-09-06 DIAGNOSIS — M6281 Muscle weakness (generalized): Secondary | ICD-10-CM | POA: Insufficient documentation

## 2023-09-10 ENCOUNTER — Ambulatory Visit: Payer: Medicare Other

## 2023-09-10 DIAGNOSIS — M25571 Pain in right ankle and joints of right foot: Secondary | ICD-10-CM | POA: Diagnosis not present

## 2023-09-10 DIAGNOSIS — M25671 Stiffness of right ankle, not elsewhere classified: Secondary | ICD-10-CM

## 2023-09-10 DIAGNOSIS — M6281 Muscle weakness (generalized): Secondary | ICD-10-CM

## 2023-09-10 NOTE — Therapy (Signed)
 OUTPATIENT PHYSICAL THERAPY TREATMENT   Patient Name: Becky Ward MRN: [id number] DOB:Sep 16, 1955, 68 y.o., female Today's Date: 09/10/2023  END OF SESSION:  PT End of Session - 09/10/23 1306     Visit Number 2    Number of Visits 17    Date for PT Re-Evaluation 11/01/23    Progress Note Due on Visit 10    PT Start Time 1306    PT Stop Time 1349    PT Time Calculation (min) 43 min    Activity Tolerance Patient tolerated treatment well    Behavior During Therapy [facility] for tasks assessed/performed              Past Medical History:  Diagnosis Date   Benign essential HTN    Chronic gastritis without bleeding    Closed fracture of right proximal humerus 01/17/2023   Closed trimalleolar fracture of right ankle 01/17/2023   Colitis    Hypercholesterolemia    Hypokalemia    Mild anemia    Pulmonary nodules    Rheumatoid arthritis, seropositive (HCC)    Thrombocytosis    Past Surgical History:  Procedure Laterality Date   APPENDECTOMY     BREAST BIOPSY Left    CERVICAL CONE BIOPSY     COLONOSCOPY WITH ESOPHAGOGASTRODUODENOSCOPY (EGD)  04/17/2019   EXTERNAL FIXATION REMOVAL Right 03/08/2023   Procedure: REMOVAL EXTERNAL FIXATION RIGHT ANKLE;  Surgeon: [name], DPM;  Location: [facility];  Service: Orthopedics/Podiatry;  Laterality: Right;   NASAL SINUS SURGERY     ORIF ANKLE FRACTURE Right 01/17/2023   Procedure: OPEN REDUCTION INTERNAL FIXATION (ORIF) ANKLE FRACTURE;  Surgeon: [name], DPM;  Location: [facility];  Service: Orthopedics/Podiatry;  Laterality: Right;   OVARIAN CYST SURGERY     Patient Active Problem List   Diagnosis Date Noted   Malnutrition of moderate degree 01/16/2023   Closed right trimalleolar fracture, initial encounter 01/14/2023   Alcohol use disorder 01/14/2023   Closed fracture of right proximal humerus 01/14/2023   Alcohol intoxication (HCC) 01/14/2023   Hypokalemia 01/14/2023   Closed displaced fracture of proximal phalanx of  left little finger 08/22/2021   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   Thrombocytosis 04/24/2021   Elevated alkaline phosphatase level 04/07/2021   Benign essential HTN 05/13/2020   History of subarachnoid hemorrhage 03/09/2020   Chronic gastritis without bleeding 04/23/2019   Anemia, mild 09/01/2018   Hypercholesterolemia 09/01/2018   Low vitamin D level 09/01/2018   Pulmonary nodule 07/26/2017   Tobacco abuse 03/16/2014    PCP: Margit Hanks, MD  REFERRING PROVIDER: [name], DPM  REFERRING DIAG:  [phone] (ICD-10-CM) - Complex regional pain syndrome type 1 of right lower extremity S82.851A (ICD-10-CM) - Closed right trimalleolar fracture, initial encounter  THERAPY DIAG:  Pain in right ankle and joints of right foot  Stiffness of right ankle, not elsewhere classified  Muscle weakness (generalized)  Rationale for Evaluation and Treatment: Rehabilitation  ONSET DATE: 01/17/23  SUBJECTIVE:   SUBJECTIVE STATEMENT: Has a hard time doing toe crunches. Had PT at the hospital after her surgery and it hurt.     PERTINENT HISTORY: S/p ORIF with additional external fixation of R ankle fracture on 01/17/23 with removal of external fixator on 03/08/23. She had an onset of CRPS type 1 in R foot.  PMH: HTN, RA PAIN:  Are you having pain? No - at worst 9/10   PRECAUTIONS: Fall  RED FLAGS: None   WEIGHT BEARING RESTRICTIONS: No  FALLS:  Has patient fallen in  last 6 months? No  LIVING ENVIRONMENT: Lives with: lives with their daughter Lives in: House/apartment Stairs: 5 stairs and she scoots up backwards on her buttocks  Has following equipment at home: Environmental consultant - 2 wheeled, Wheelchair (manual), and Tour manager  OCCUPATION: retired   PLOF: Needs assistance with transfers - independent within the home but requires assistance to get in/out of the truck   PATIENT GOALS: to be able to get in my truck and walk    OBJECTIVE:  Note: Objective measures were completed at  Evaluation unless otherwise noted.  DIAGNOSTIC FINDINGS: N/A  PATIENT SURVEYS:  LEFS to be completed visit #2  COGNITION: Overall cognitive status: Within functional limits for tasks assessed     SENSATION: Light touch: Impaired  and hypersensitive    POSTURE: rounded shoulders and forward head  PALPATION: Severe tenderness to palpation to R ankle, forefoot, and toes  LOWER EXTREMITY ROM:  Active ROM Right eval Left eval  Hip flexion    Hip extension    Hip abduction    Hip adduction    Hip internal rotation    Hip external rotation    Knee flexion    Knee extension    Ankle dorsiflexion  -20  Ankle plantarflexion  30  Ankle inversion  20  Ankle eversion  10   (Blank rows = not tested)  LOWER EXTREMITY MMT:   Not tested due to poor tolerance  MMT Right eval Left eval  Hip flexion    Hip extension    Hip abduction    Hip adduction    Hip internal rotation    Hip external rotation    Knee flexion    Knee extension    Ankle dorsiflexion    Ankle plantarflexion    Ankle inversion    Ankle eversion     (Blank rows = not tested)  FUNCTIONAL TESTS:  30 seconds chair stand test - to be completed in subsequent visits   GAIT: Distance walked: 0' Comments: standing not tested this session due to increased pain with ROM testing                                                                                                                                 TREATMENT DATE: 09/10/23   Therapeutic Exercise  Reclined  Hooklying   With PT assist .    R ankle DF/PF AAROM 10x2 with gentle manual PF resistance     R ankle DF AAROM with gentle DF manual PT resistance 5x3     Toe curls with PT assist 5x3     R ankle EV/IF with PT assist 5x3     PT manual hand placement on either plantar and dorsal foot or planar and distal leg for tactile cues and desensitization  Sitting with R foot on pillow for gentle weight bearing.   Gentle toe curls 5x   Gentle heel  slides 5x5 seconds to promote  ankle DF ROM. Rest breaks secondary to discomfort.    Limited R ankle DF ROM observed.   Reclined   R ankle DF/PF 5x2  Pt states that her doctor recommended her to take calcium and vitamin D. Pt was recommended to do what her doctor asked her to help promote healing for her foot.     Improved exercise technique, movement at target joints, use of target muscles after mod verbal, visual, tactile cues.       PATIENT EDUCATION:  Education details: HEP, goals, POC Person educated: Patient Education method: Explanation, Demonstration, and Handouts Education comprehension: verbalized understanding and returned demonstration  HOME EXERCISE PROGRAM: Access Code: [email or web address] URL: [email or web address] Date: 09/06/2023 Prepared by: Maylon Peppers  Exercises - Supine Ankle Pumps  - 2-3 x daily - 5-7 x weekly - 3 sets - 10 reps - Supine Ankle Inversion Eversion AROM  - 2-3 x daily - 5-7 x weekly - 3 sets - 10 reps - Long Sitting Calf Stretch with Strap  - 2-3 x daily - 5-7 x weekly - 3-5 reps - Towel Scrunches  - 2-3 x daily - 5-7 x weekly - 3 sets - 10 reps    ASSESSMENT:  CLINICAL IMPRESSION: Worked on gentle R ankle and toe AROM and gentle resistance while applying broad hand contact to her R foot (dorsal/plantar surface) as well as distal leg to help with desensitization and increase tolerance to light touch. Pt tolerated the light touch very well today. Pt was also recommended to have her R foot in weight bearing positions (to promote bone growth via Wolf's law) but alternating to non-weight bearing and propping her R LE up with ankle pumps so as to alleviate R foot and ankle swelling from being in the dependent position. Pt verbalized understanding. Pt tolerated today's exercises/session well without aggravation of symptoms. Pt will benefit from continued skilled physical therapy services to improve strength, function, and ability to ambulate.       OBJECTIVE IMPAIRMENTS: Abnormal gait, cardiopulmonary status limiting activity, decreased activity tolerance, decreased balance, decreased endurance, decreased knowledge of condition, decreased knowledge of use of DME, decreased mobility, difficulty walking, decreased ROM, decreased strength, hypomobility, impaired flexibility, impaired sensation, and pain.   ACTIVITY LIMITATIONS: standing, squatting, stairs, transfers, and locomotion level  PARTICIPATION LIMITATIONS: driving, community activity, and yard work  PERSONAL FACTORS: Age, Behavior pattern, Past/current experiences, Time since onset of injury/illness/exacerbation, and Transportation are also affecting patient's functional outcome.   REHAB POTENTIAL: Fair    CLINICAL DECISION MAKING: Evolving/moderate complexity  EVALUATION COMPLEXITY: High   GOALS: Goals reviewed with patient? Yes  SHORT TERM GOALS: Target date: 10/04/2023  Patient will be independent in HEP to improve strength/mobility for better functional independence with ADLs. Baseline: Goal status: INITIAL   LONG TERM GOALS: Target date: 11/01/2023   Patient will increase BLE gross strength to 4+/5 as to improve functional strength for independent gait, increased standing tolerance and increased ADL ability. Baseline: 3/14: unable to be tested  Goal status: INITIAL  2.  Patient will tolerate standing x 5 minutes with LRAD and modI to improve tolerance to weightbearing on R LE.  Baseline: 3/14: not tested on eval due to hypersensitivity  Goal status: INITIAL  3.  Patient will ambulate 79' with LRAD modI with minimal gait deviations to improve household ambulator ability.  Baseline:  Goal status: INITIAL  4. Patient will improve R ankle DF ROM to neutral to improve gait mechanics with step through gait.  Baseline:  Goal status: INITIAL  PLAN:  PT FREQUENCY: 1-2x/week  PT DURATION: 8 weeks  PLANNED INTERVENTIONS: 97164- PT Re-evaluation,  97110-Therapeutic exercises, 97530- Therapeutic activity, 97112- Neuromuscular re-education, [phone]- Self Care, 19147- Manual therapy, [phone]- Gait training, [phone]- Orthotic Fit/training, [phone]- Aquatic Therapy, [phone]- Electrical stimulation (manual), Patient/Family education, Balance training, Stair training, Taping, Dry Needling, Joint mobilization, Joint manipulation, DME instructions, Wheelchair mobility training, Cryotherapy, and Moist heat  PLAN FOR NEXT SESSION: LEFS or other more appropriate outcome measure, desensitization techniques, gentle ROM, attempt standing on visit #2  Loralyn Freshwater PT, DPT Physical Therapist - [facility] 09/10/2023, 2:04 PM

## 2023-09-12 ENCOUNTER — Ambulatory Visit: Payer: Medicare Other

## 2023-09-12 DIAGNOSIS — M25571 Pain in right ankle and joints of right foot: Secondary | ICD-10-CM | POA: Diagnosis not present

## 2023-09-12 DIAGNOSIS — M6281 Muscle weakness (generalized): Secondary | ICD-10-CM

## 2023-09-12 DIAGNOSIS — M25671 Stiffness of right ankle, not elsewhere classified: Secondary | ICD-10-CM

## 2023-09-12 NOTE — Therapy (Signed)
 OUTPATIENT PHYSICAL THERAPY TREATMENT   Patient Name: Becky Ward MRN: [id number] DOB:1955-08-21, 68 y.o., female Today's Date: 09/12/2023  END OF SESSION:  PT End of Session - 09/12/23 1300     Visit Number 3    Number of Visits 17    Date for PT Re-Evaluation 11/01/23    Progress Note Due on Visit 10    PT Start Time 1301    PT Stop Time 1347    PT Time Calculation (min) 46 min    Activity Tolerance Patient tolerated treatment well    Behavior During Therapy [facility] for tasks assessed/performed               Past Medical History:  Diagnosis Date   Benign essential HTN    Chronic gastritis without bleeding    Closed fracture of right proximal humerus 01/17/2023   Closed trimalleolar fracture of right ankle 01/17/2023   Colitis    Hypercholesterolemia    Hypokalemia    Mild anemia    Pulmonary nodules    Rheumatoid arthritis, seropositive (HCC)    Thrombocytosis    Past Surgical History:  Procedure Laterality Date   APPENDECTOMY     BREAST BIOPSY Left    CERVICAL CONE BIOPSY     COLONOSCOPY WITH ESOPHAGOGASTRODUODENOSCOPY (EGD)  04/17/2019   EXTERNAL FIXATION REMOVAL Right 03/08/2023   Procedure: REMOVAL EXTERNAL FIXATION RIGHT ANKLE;  Surgeon: [name], DPM;  Location: [facility];  Service: Orthopedics/Podiatry;  Laterality: Right;   NASAL SINUS SURGERY     ORIF ANKLE FRACTURE Right 01/17/2023   Procedure: OPEN REDUCTION INTERNAL FIXATION (ORIF) ANKLE FRACTURE;  Surgeon: [name], DPM;  Location: [facility];  Service: Orthopedics/Podiatry;  Laterality: Right;   OVARIAN CYST SURGERY     Patient Active Problem List   Diagnosis Date Noted   Malnutrition of moderate degree 01/16/2023   Closed right trimalleolar fracture, initial encounter 01/14/2023   Alcohol use disorder 01/14/2023   Closed fracture of right proximal humerus 01/14/2023   Alcohol intoxication (HCC) 01/14/2023   Hypokalemia 01/14/2023   Closed displaced fracture of proximal phalanx of  left little finger 08/22/2021   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   Thrombocytosis 04/24/2021   Elevated alkaline phosphatase level 04/07/2021   Benign essential HTN 05/13/2020   History of subarachnoid hemorrhage 03/09/2020   Chronic gastritis without bleeding 04/23/2019   Anemia, mild 09/01/2018   Hypercholesterolemia 09/01/2018   Low vitamin D level 09/01/2018   Pulmonary nodule 07/26/2017   Tobacco abuse 03/16/2014    PCP: Margit Hanks, MD  REFERRING PROVIDER: [name], DPM  REFERRING DIAG:  [phone] (ICD-10-CM) - Complex regional pain syndrome type 1 of right lower extremity S82.851A (ICD-10-CM) - Closed right trimalleolar fracture, initial encounter  THERAPY DIAG:  Pain in right ankle and joints of right foot  Stiffness of right ankle, not elsewhere classified  Muscle weakness (generalized)  Rationale for Evaluation and Treatment: Rehabilitation  ONSET DATE: 01/17/23  SUBJECTIVE:   SUBJECTIVE STATEMENT: Worked on some exercises at home the other day. Tried standing up without the boot and had a lot of pain.    PERTINENT HISTORY: S/p ORIF with additional external fixation of R ankle fracture on 01/17/23 with removal of external fixator on 03/08/23. She had an onset of CRPS type 1 in R foot.  PMH: HTN, RA PAIN:  Are you having pain? No - at worst 9/10   PRECAUTIONS: Fall  RED FLAGS: None   WEIGHT BEARING RESTRICTIONS: No  FALLS:  Has  patient fallen in last 6 months? No  LIVING ENVIRONMENT: Lives with: lives with their daughter Lives in: House/apartment Stairs: 5 stairs and she scoots up backwards on her buttocks  Has following equipment at home: Environmental consultant - 2 wheeled, Wheelchair (manual), and Tour manager  OCCUPATION: retired   PLOF: Needs assistance with transfers - independent within the home but requires assistance to get in/out of the truck   PATIENT GOALS: to be able to get in my truck and walk    OBJECTIVE:  Note: Objective measures were  completed at Evaluation unless otherwise noted.  DIAGNOSTIC FINDINGS: N/A  PATIENT SURVEYS:  LEFS to be completed visit #2  COGNITION: Overall cognitive status: Within functional limits for tasks assessed     SENSATION: Light touch: Impaired  and hypersensitive    POSTURE: rounded shoulders and forward head  PALPATION: Severe tenderness to palpation to R ankle, forefoot, and toes  LOWER EXTREMITY ROM:  Active ROM Right eval Left eval  Hip flexion    Hip extension    Hip abduction    Hip adduction    Hip internal rotation    Hip external rotation    Knee flexion    Knee extension    Ankle dorsiflexion  -20  Ankle plantarflexion  30  Ankle inversion  20  Ankle eversion  10   (Blank rows = not tested)  LOWER EXTREMITY MMT:   Not tested due to poor tolerance  MMT Right eval Left eval  Hip flexion    Hip extension    Hip abduction    Hip adduction    Hip internal rotation    Hip external rotation    Knee flexion    Knee extension    Ankle dorsiflexion    Ankle plantarflexion    Ankle inversion    Ankle eversion     (Blank rows = not tested)  FUNCTIONAL TESTS:  30 seconds chair stand test - to be completed in subsequent visits   GAIT: Distance walked: 0' Comments: standing not tested this session due to increased pain with ROM testing                                                                                                                                 TREATMENT DATE: 09/12/23   Therapeutic Exercise  With CAM boot on  NuStep  Seat 7, arms 7, level 1 for 5 minutes to improve R LE weight bearing tolerance    Standing with B UE assist from rw  R weight shifting 10x3   Then with R foot in front   Forward weight shifting onto R foot 10x3  Seated press-ups from NuStep chair 10x3  To promote UE strength for gait with rw.   Standign with B UE assist   Hip abduction    R 10x2, then 10x5 seconds    Gait with rw, CGA   Able to take 4  steps, 2x  with step to pattern   Then 30 ft with step to pattern   Improved exercise technique, movement at target joints, use of target muscles after mod verbal, visual, tactile cues.       PATIENT EDUCATION:  Education details: HEP, goals, POC Person educated: Patient Education method: Explanation, Demonstration, and Handouts Education comprehension: verbalized understanding and returned demonstration  HOME EXERCISE PROGRAM: Access Code: [email or web address] URL: [email or web address] Date: 09/12/2023 Prepared by: Loralyn Freshwater  Exercises - Supine Ankle Pumps  - 2-3 x daily - 5-7 x weekly - 3 sets - 10 reps - Supine Ankle Inversion Eversion AROM  - 2-3 x daily - 5-7 x weekly - 3 sets - 10 reps - Long Sitting Calf Stretch with Strap  - 2-3 x daily - 5-7 x weekly - 3-5 reps - Towel Scrunches  - 2-3 x daily - 5-7 x weekly - 3 sets - 10 reps - Side to Side Weight Shift with Counter Support  - 2 x daily - 7 x weekly - 3 sets - 10 reps - Seated Shoulder Press Ups Off Table  - 1 x daily - 7 x weekly - 3 sets - 10 reps     ASSESSMENT:  CLINICAL IMPRESSION:  Worked on improving R LE weight bearing tolerance as well as glute and UE strengthening to promote ability to ambulate and use a rw to help transition from wc to rw. Pt was able to ambulate up to 30 ft with rw CGA with step to pattern with her CAM boot on at end of session. Pt was recommended to prop her R LE at home with ice (with cloth barrier so it is not too cold) for 15-20 minutes after today's session and that discomfort is ok secondary to doing a lot today with her R LE. Pt verbalized understanding. Pt tolerated today's exercises/session well without aggravation of symptoms. Pt will benefit from continued skilled physical therapy services to improve strength, function, and ability to ambulate.      OBJECTIVE IMPAIRMENTS: Abnormal gait, cardiopulmonary status limiting activity, decreased activity tolerance, decreased  balance, decreased endurance, decreased knowledge of condition, decreased knowledge of use of DME, decreased mobility, difficulty walking, decreased ROM, decreased strength, hypomobility, impaired flexibility, impaired sensation, and pain.   ACTIVITY LIMITATIONS: standing, squatting, stairs, transfers, and locomotion level  PARTICIPATION LIMITATIONS: driving, community activity, and yard work  PERSONAL FACTORS: Age, Behavior pattern, Past/current experiences, Time since onset of injury/illness/exacerbation, and Transportation are also affecting patient's functional outcome.   REHAB POTENTIAL: Fair    CLINICAL DECISION MAKING: Evolving/moderate complexity  EVALUATION COMPLEXITY: High   GOALS: Goals reviewed with patient? Yes  SHORT TERM GOALS: Target date: 10/04/2023  Patient will be independent in HEP to improve strength/mobility for better functional independence with ADLs. Baseline: Goal status: INITIAL   LONG TERM GOALS: Target date: 11/01/2023   Patient will increase BLE gross strength to 4+/5 as to improve functional strength for independent gait, increased standing tolerance and increased ADL ability. Baseline: 3/14: unable to be tested  Goal status: INITIAL  2.  Patient will tolerate standing x 5 minutes with LRAD and modI to improve tolerance to weightbearing on R LE.  Baseline: 3/14: not tested on eval due to hypersensitivity  Goal status: INITIAL  3.  Patient will ambulate 64' with LRAD modI with minimal gait deviations to improve household ambulator ability.  Baseline:  Goal status: INITIAL  4. Patient will improve R ankle DF ROM to neutral to improve gait mechanics with step through gait.  Baseline:  Goal status: INITIAL   PLAN:  PT FREQUENCY: 1-2x/week  PT DURATION: 8 weeks  PLANNED INTERVENTIONS: 97164- PT Re-evaluation, 97110-Therapeutic exercises, 97530- Therapeutic activity, 97112- Neuromuscular re-education, [phone]- Self Care, 41324- Manual therapy,  [phone]- Gait training, [phone]- Orthotic Fit/training, [phone]- Aquatic Therapy, [phone]- Electrical stimulation (manual), Patient/Family education, Balance training, Stair training, Taping, Dry Needling, Joint mobilization, Joint manipulation, DME instructions, Wheelchair mobility training, Cryotherapy, and Moist heat  PLAN FOR NEXT SESSION: LEFS or other more appropriate outcome measure, desensitization techniques, gentle ROM, attempt standing on visit #2  Loralyn Freshwater PT, DPT Physical Therapist - [facility]  [facility] 09/12/2023, 4:53 PM

## 2023-09-17 ENCOUNTER — Ambulatory Visit: Payer: Medicare Other

## 2023-09-17 DIAGNOSIS — M25571 Pain in right ankle and joints of right foot: Secondary | ICD-10-CM | POA: Diagnosis not present

## 2023-09-17 DIAGNOSIS — M6281 Muscle weakness (generalized): Secondary | ICD-10-CM

## 2023-09-17 DIAGNOSIS — M25671 Stiffness of right ankle, not elsewhere classified: Secondary | ICD-10-CM

## 2023-09-17 NOTE — Therapy (Signed)
 OUTPATIENT PHYSICAL THERAPY TREATMENT   Patient Name: Becky Ward MRN: [id number] DOB:1956-03-31, 68 y.o., female Today's Date: 09/17/2023  END OF SESSION:  PT End of Session - 09/17/23 1304     Visit Number 4    Number of Visits 17    Date for PT Re-Evaluation 11/01/23    Progress Note Due on Visit 10    PT Start Time 1304    PT Stop Time 1345    PT Time Calculation (min) 41 min    Activity Tolerance Patient tolerated treatment well    Behavior During Therapy [facility] for tasks assessed/performed                Past Medical History:  Diagnosis Date   Benign essential HTN    Chronic gastritis without bleeding    Closed fracture of right proximal humerus 01/17/2023   Closed trimalleolar fracture of right ankle 01/17/2023   Colitis    Hypercholesterolemia    Hypokalemia    Mild anemia    Pulmonary nodules    Rheumatoid arthritis, seropositive (HCC)    Thrombocytosis    Past Surgical History:  Procedure Laterality Date   APPENDECTOMY     BREAST BIOPSY Left    CERVICAL CONE BIOPSY     COLONOSCOPY WITH ESOPHAGOGASTRODUODENOSCOPY (EGD)  04/17/2019   EXTERNAL FIXATION REMOVAL Right 03/08/2023   Procedure: REMOVAL EXTERNAL FIXATION RIGHT ANKLE;  Surgeon: [name], DPM;  Location: [facility] ORS;  Service: Orthopedics/Podiatry;  Laterality: Right;   NASAL SINUS SURGERY     ORIF ANKLE FRACTURE Right 01/17/2023   Procedure: OPEN REDUCTION INTERNAL FIXATION (ORIF) ANKLE FRACTURE;  Surgeon: [name], DPM;  Location: [facility] ORS;  Service: Orthopedics/Podiatry;  Laterality: Right;   OVARIAN CYST SURGERY     Patient Active Problem List   Diagnosis Date Noted   Malnutrition of moderate degree 01/16/2023   Closed right trimalleolar fracture, initial encounter 01/14/2023   Alcohol use disorder 01/14/2023   Closed fracture of right proximal humerus 01/14/2023   Alcohol intoxication (HCC) 01/14/2023   Hypokalemia 01/14/2023   Closed displaced fracture of proximal phalanx of  left little finger 08/22/2021   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   Thrombocytosis 04/24/2021   Elevated alkaline phosphatase level 04/07/2021   Benign essential HTN 05/13/2020   History of subarachnoid hemorrhage 03/09/2020   Chronic gastritis without bleeding 04/23/2019   Anemia, mild 09/01/2018   Hypercholesterolemia 09/01/2018   Low vitamin D level 09/01/2018   Pulmonary nodule 07/26/2017   Tobacco abuse 03/16/2014    PCP: Margit Hanks, MD  REFERRING PROVIDER: [name], DPM  REFERRING DIAG:  [id number] (ICD-10-CM) - Complex regional pain syndrome type 1 of right lower extremity S82.851A (ICD-10-CM) - Closed right trimalleolar fracture, initial encounter  THERAPY DIAG:  Pain in right ankle and joints of right foot  Stiffness of right ankle, not elsewhere classified  Muscle weakness (generalized)  Rationale for Evaluation and Treatment: Rehabilitation  ONSET DATE: 01/17/23  SUBJECTIVE:   SUBJECTIVE STATEMENT: Has been coughing. Does not know if its the pollen. Has been trying to put weight onto her R foot. Slight R foot pain currently.      PERTINENT HISTORY: S/p ORIF with additional external fixation of R ankle fracture on 01/17/23 with removal of external fixator on 03/08/23. She had an onset of CRPS type 1 in R foot.  PMH: HTN, RA PAIN:  Are you having pain? No - at worst 9/10   PRECAUTIONS: Fall  RED FLAGS: None   WEIGHT  BEARING RESTRICTIONS: No  FALLS:  Has patient fallen in last 6 months? No  LIVING ENVIRONMENT: Lives with: lives with their daughter Lives in: House/apartment Stairs: 5 stairs and she scoots up backwards on her buttocks  Has following equipment at home: Environmental consultant - 2 wheeled, Wheelchair (manual), and Tour manager  OCCUPATION: retired   PLOF: Needs assistance with transfers - independent within the home but requires assistance to get in/out of the truck   PATIENT GOALS: to be able to get in my truck and walk    OBJECTIVE:   Note: Objective measures were completed at Evaluation unless otherwise noted.  DIAGNOSTIC FINDINGS: N/A  PATIENT SURVEYS:  LEFS to be completed visit #2  COGNITION: Overall cognitive status: Within functional limits for tasks assessed     SENSATION: Light touch: Impaired  and hypersensitive    POSTURE: rounded shoulders and forward head  PALPATION: Severe tenderness to palpation to R ankle, forefoot, and toes  LOWER EXTREMITY ROM:  Active ROM Right eval Left eval  Hip flexion    Hip extension    Hip abduction    Hip adduction    Hip internal rotation    Hip external rotation    Knee flexion    Knee extension    Ankle dorsiflexion  -20  Ankle plantarflexion  30  Ankle inversion  20  Ankle eversion  10   (Blank rows = not tested)  LOWER EXTREMITY MMT:   Not tested due to poor tolerance  MMT Right eval Left eval  Hip flexion    Hip extension    Hip abduction    Hip adduction    Hip internal rotation    Hip external rotation    Knee flexion    Knee extension    Ankle dorsiflexion    Ankle plantarflexion    Ankle inversion    Ankle eversion     (Blank rows = not tested)  FUNCTIONAL TESTS:  30 seconds chair stand test - to be completed in subsequent visits   GAIT: Distance walked: 0' Comments: standing not tested this session due to increased pain with ROM testing                                                                                                                                 TREATMENT DATE: 09/17/23   Therapeutic Exercise  With CAM boot on  NuStep  Seat 7, arms 7, level 1 for 5 minutes to improve R LE weight bearing tolerance   Gait with rw 18 ft CGA, step to pattern  Standing with B UE assist   Hip abduction    R 10x, then 10x5 seconds for 2 sets   Gait with rw 30 ft CGA, step to pattern  Without CAM boot Reclined               Hooklying  With PT assist .                                   R ankle  DF/PF AAROM 10x2 with gentle manual PF resistance                                     R ankle DF AAROM with gentle DF manual PT resistance 5x3                                     Toe curls with PT assist 5x3                                     R ankle EV/IV with PT assist 5x2                                     PT manual hand placement on either plantar and dorsal foot or planar and distal leg for tactile cues and desensitization       Improved exercise technique, movement at target joints, use of target muscles after mod verbal, visual, tactile cues.       PATIENT EDUCATION:  Education details: HEP, goals, POC Person educated: Patient Education method: Explanation, Demonstration, and Handouts Education comprehension: verbalized understanding and returned demonstration  HOME EXERCISE PROGRAM: Access Code: [email or web address] URL: [email or web address] Date: 09/12/2023 Prepared by: Loralyn Freshwater  Exercises - Supine Ankle Pumps  - 2-3 x daily - 5-7 x weekly - 3 sets - 10 reps - Supine Ankle Inversion Eversion AROM  - 2-3 x daily - 5-7 x weekly - 3 sets - 10 reps - Long Sitting Calf Stretch with Strap  - 2-3 x daily - 5-7 x weekly - 3-5 reps - Towel Scrunches  - 2-3 x daily - 5-7 x weekly - 3 sets - 10 reps - Side to Side Weight Shift with Counter Support  - 2 x daily - 7 x weekly - 3 sets - 10 reps - Seated Shoulder Press Ups Off Table  - 1 x daily - 7 x weekly - 3 sets - 10 reps     ASSESSMENT:  CLINICAL IMPRESSION:  Continued working on improving R LE weight bearing tolerance as well as glute and UE strengthening to promote ability to ambulate and use a rw to help transition from wc to rw. Continued working on R ankle movement and gentle strengthening to promote ability to ambulate with less difficulty and for desensitization. Fair tolerance to today's session. Pt will benefit from continued skilled physical therapy services to improve strength, function, and ability  to ambulate.      OBJECTIVE IMPAIRMENTS: Abnormal gait, cardiopulmonary status limiting activity, decreased activity tolerance, decreased balance, decreased endurance, decreased knowledge of condition, decreased knowledge of use of DME, decreased mobility, difficulty walking, decreased ROM, decreased strength, hypomobility, impaired flexibility, impaired sensation, and pain.   ACTIVITY LIMITATIONS: standing, squatting, stairs, transfers, and locomotion level  PARTICIPATION LIMITATIONS: driving, community activity, and yard work  PERSONAL FACTORS: Age, Behavior pattern, Past/current experiences, Time since onset of injury/illness/exacerbation, and Transportation are also affecting patient's functional outcome.  REHAB POTENTIAL: Fair    CLINICAL DECISION MAKING: Evolving/moderate complexity  EVALUATION COMPLEXITY: High   GOALS: Goals reviewed with patient? Yes  SHORT TERM GOALS: Target date: 10/04/2023  Patient will be independent in HEP to improve strength/mobility for better functional independence with ADLs. Baseline: Goal status: INITIAL   LONG TERM GOALS: Target date: 11/01/2023   Patient will increase BLE gross strength to 4+/5 as to improve functional strength for independent gait, increased standing tolerance and increased ADL ability. Baseline: 3/14: unable to be tested  Goal status: INITIAL  2.  Patient will tolerate standing x 5 minutes with LRAD and modI to improve tolerance to weightbearing on R LE.  Baseline: 3/14: not tested on eval due to hypersensitivity  Goal status: INITIAL  3.  Patient will ambulate 3' with LRAD modI with minimal gait deviations to improve household ambulator ability.  Baseline:  Goal status: INITIAL  4. Patient will improve R ankle DF ROM to neutral to improve gait mechanics with step through gait.  Baseline:  Goal status: INITIAL   PLAN:  PT FREQUENCY: 1-2x/week  PT DURATION: 8 weeks  PLANNED INTERVENTIONS: 97164- PT  Re-evaluation, 97110-Therapeutic exercises, 97530- Therapeutic activity, 97112- Neuromuscular re-education, [phone]- Self Care, 14782- Manual therapy, [phone]- Gait training, [phone]- Orthotic Fit/training, [phone]- Aquatic Therapy, [phone]- Electrical stimulation (manual), Patient/Family education, Balance training, Stair training, Taping, Dry Needling, Joint mobilization, Joint manipulation, DME instructions, Wheelchair mobility training, Cryotherapy, and Moist heat  PLAN FOR NEXT SESSION: LEFS or other more appropriate outcome measure, desensitization techniques, gentle ROM, attempt standing on visit #2  Loralyn Freshwater PT, DPT Physical Therapist - [facility] 09/17/2023, 4:24 PM

## 2023-09-19 ENCOUNTER — Ambulatory Visit: Payer: Medicare Other

## 2023-09-19 ENCOUNTER — Telehealth: Payer: Self-pay

## 2023-09-19 NOTE — Telephone Encounter (Signed)
 No show. Called patient and left a message pertaining to appointment and a reminder for the next follow up session. Return phone call requested. Phone number [phone]) provided.   Also offered a later appointment this afternoon if she is interested.

## 2023-09-21 DIAGNOSIS — I10 Essential (primary) hypertension: Secondary | ICD-10-CM | POA: Insufficient documentation

## 2023-09-21 DIAGNOSIS — S93402A Sprain of unspecified ligament of left ankle, initial encounter: Secondary | ICD-10-CM | POA: Insufficient documentation

## 2023-09-21 DIAGNOSIS — Z7982 Long term (current) use of aspirin: Secondary | ICD-10-CM | POA: Diagnosis not present

## 2023-09-21 DIAGNOSIS — Z79899 Other long term (current) drug therapy: Secondary | ICD-10-CM | POA: Diagnosis not present

## 2023-09-21 DIAGNOSIS — W1839XA Other fall on same level, initial encounter: Secondary | ICD-10-CM | POA: Insufficient documentation

## 2023-09-21 DIAGNOSIS — M25572 Pain in left ankle and joints of left foot: Secondary | ICD-10-CM | POA: Diagnosis present

## 2023-09-21 NOTE — ED Triage Notes (Signed)
 First RN Note: Pt to ED via [facility] from home, per EMS pt fell around 2pm today and is c/o L lateral ankle pain, per EMS pt reports has been unable to bear weight on her ankle. Per EMS pt denies other injury, denies LOC.   101/62 74HR 95% RA CBG 112 98.3

## 2023-09-22 ENCOUNTER — Other Ambulatory Visit: Payer: Self-pay

## 2023-09-22 ENCOUNTER — Emergency Department

## 2023-09-22 ENCOUNTER — Encounter: Payer: Self-pay | Admitting: Emergency Medicine

## 2023-09-22 ENCOUNTER — Emergency Department
Admission: EM | Admit: 2023-09-22 | Discharge: 2023-09-22 | Disposition: A | Discharge status: Home / Self Care | Attending: Emergency Medicine | Admitting: Emergency Medicine

## 2023-09-22 DIAGNOSIS — S93402A Sprain of unspecified ligament of left ankle, initial encounter: Secondary | ICD-10-CM | POA: Diagnosis not present

## 2023-09-22 MED ORDER — HYDROCODONE-ACETAMINOPHEN 5-325 MG PO TABS
2.0000 | ORAL_TABLET | Freq: Once | ORAL | Status: AC
  Administered 2023-09-22: 2 via ORAL
  Filled 2023-09-22: qty 2

## 2023-09-22 MED ORDER — HYDROCODONE-ACETAMINOPHEN 5-325 MG PO TABS: 2.0000 | ORAL_TABLET | Freq: Three times a day (TID) | ORAL | 0 refills | Status: DC | PRN

## 2023-09-22 MED ORDER — ONDANSETRON 4 MG PO TBDP: 4.0000 mg | ORAL_TABLET | Freq: Four times a day (QID) | ORAL | 0 refills | Status: AC | PRN

## 2023-09-22 MED ORDER — ONDANSETRON 4 MG PO TBDP
4.0000 mg | ORAL_TABLET | Freq: Once | ORAL | Status: AC
  Administered 2023-09-22: 4 mg via ORAL
  Filled 2023-09-22: qty 1

## 2023-09-22 NOTE — ED Provider Notes (Signed)
 [facility] Provider Note    Event Date/Time   First MD Initiated Contact with Patient 09/22/23 0104     (approximate)   History   Ankle Pain   HPI  Becky Ward is a 68 y.o. female with history of hypertension, hyperlipidemia, rheumatoid arthritis who presents to the emergency department after a left ankle injury.  Patient is being treated by podiatry for a recent trimalleolar fracture of the right ankle status post ORIF on 01/17/2023 and has complex regional pain syndrome and has been in a cam walker.  She states that she is mostly using a wheelchair at home but will get up to go to the bathroom.  She states that tonight she got up from her wheelchair to go to the bathroom and twisted her left ankle and fell to her bottom.  Did not hit her head.  States she is now unable to bear any weight on the left ankle.   History provided by patient.    Past Medical History:  Diagnosis Date   Benign essential HTN    Chronic gastritis without bleeding    Closed fracture of right proximal humerus 01/17/2023   Closed trimalleolar fracture of right ankle 01/17/2023   Colitis    Hypercholesterolemia    Hypokalemia    Mild anemia    Pulmonary nodules    Rheumatoid arthritis, seropositive (HCC)    Thrombocytosis     Past Surgical History:  Procedure Laterality Date   APPENDECTOMY     BREAST BIOPSY Left    CERVICAL CONE BIOPSY     COLONOSCOPY WITH ESOPHAGOGASTRODUODENOSCOPY (EGD)  04/17/2019   EXTERNAL FIXATION REMOVAL Right 03/08/2023   Procedure: REMOVAL EXTERNAL FIXATION RIGHT ANKLE;  Surgeon: [name], DPM;  Location: [facility] OR[facility];  Service: Orthopedics/Podiatry;  Laterality: Right;   NASAL SINUS SURGERY     ORIF ANKLE FRACTURE Right 01/17/2023   Procedure: OPEN REDUCTION INTERNAL FIXATION (ORIF) ANKLE FRACTURE;  Surgeon: [name], DPM;  Location: [facility] OR[facility];  Service: Orthopedics/Podiatry;  Laterality: Right;   OVARIAN CYST SURGERY       MEDICATIONS:  Prior to Admission medications   Medication Sig Start Date End Date Taking? Authorizing Provider  Aspirin-Acetaminophen-Caffeine (GOODY HEADACHE PO) Take 4 tablets by mouth daily.    [provider]  Cholecalciferol (VITAMIN D3) 50 MCG (2000 UT) CHEW Chew 2 tablets by mouth daily.    [provider]  DULoxetine (CYMBALTA) 20 MG capsule Take 1 capsule (20 mg total) by mouth daily. 05/07/23   [name], DPM  folic acid (FOLVITE) 1 MG tablet Take 1 mg by mouth daily.    [provider]  lisinopril-hydrochlorothiazide (ZESTORETIC) 20-12.5 MG tablet Take 0.5 tablets by mouth daily.    [provider]  LUMIGAN 0.01 % SOLN Place 1 drop into both eyes at bedtime. 01/07/23   [provider]  methotrexate (RHEUMATREX) 2.5 MG tablet Take 2.5 mg by mouth once a week. Take 8 tablets weekly (Thursday) 06/11/22   [provider]  Multiple Vitamins-Minerals (CENTRUM SILVER WOMEN 50+ PO) Take 1 tablet by mouth daily.    [provider]  Multiple Vitamins-Minerals (STRESS B COMPLEX/ANTIOXID/ZINC PO) Take 1 tablet by mouth daily.    [provider]  potassium chloride SA (KLOR-CON M) 20 MEQ tablet Take 20 mEq by mouth daily. Patient not taking: Reported on 07/26/2023    [provider]  pravastatin (PRAVACHOL) 20 MG tablet Take 1 tablet by mouth at bedtime. 11/10/19  [provider]    Physical Exam   Triage Vital Signs: ED Triage Vitals  Encounter Vitals Group     BP 09/22/23 0003 111/64     Systolic BP Percentile --      Diastolic BP Percentile --      Pulse Rate 09/22/23 0003 78     Resp 09/22/23 0003 19     Temp 09/22/23 0003 98.2 F (36.8 C)     Temp Source 09/22/23 0003 Oral     SpO2 09/22/23 0003 97 %     Weight 09/22/23 0002 117 lb 1 oz (53.1 kg)     Height 09/22/23 0002 5' 3.5" (1.613 m)     Head Circumference --      Peak Flow --      Pain Score 09/22/23 0002 9     Pain Loc --       Pain Education --      Exclude from Growth Chart --     Most recent vital signs: Vitals:   09/22/23 0003 09/22/23 0255  BP: 111/64 113/68  Pulse: 78 74  Resp: 19 17  Temp: 98.2 F (36.8 C) 98 F (36.7 C)  SpO2: 97% 98%     CONSTITUTIONAL: Alert and responds appropriately to questions. Well-appearing; well-nourished HEAD: Normocephalic, atraumatic EYES: Conjunctivae clear, pupils appear equal ENT: normal nose; moist mucous membranes NECK: Normal range of motion CARD: Regular rate and rhythm RESP: Normal chest excursion without splinting or tachypnea; no hypoxia or respiratory distress, speaking full sentences ABD/GI: non-distended EXT: 2+ left DP pulse.  Bruising and soft tissue swelling noted to the left lateral malleolus and foot with tenderness over the lateral malleolus.  Difficult to test for ligamentous laxity due to patient's discomfort.  No tenderness over the proximal tibia or fibula.  Normal capillary refill.  No tenderness over the dorsal foot.  Normal sensation.  No calf tenderness or calf swelling.  Compartments in the left leg are soft. SKIN: Normal color for age and race, no rashes on exposed skin NEURO: Moves all extremities equally, normal speech, no facial asymmetry noted PSYCH: The patient's mood and manner are appropriate. Grooming and personal hygiene are appropriate.  ED Results / Procedures / Treatments   LABS: (all labs ordered are listed, but only abnormal results are displayed) Labs Reviewed - No data to display   EKG:    RADIOLOGY: My personal review and interpretation of imaging: X-ray shows no fracture.  I have personally reviewed all radiology reports. DG Ankle Complete Left Result Date: 09/22/2023 CLINICAL DATA:  Fall with ankle pain EXAM: LEFT ANKLE COMPLETE - 3+ VIEW COMPARISON:  None Available. FINDINGS: Demineralization. No acute fracture or dislocation. Swelling about the lateral malleolus. IMPRESSION: No acute fracture or  dislocation. Swelling about the lateral malleolus. Electronically Signed   By: Minerva Fester M.D.   On: 09/22/2023 01:01     PROCEDURES:  Critical Care performed: No     Procedures    IMPRESSION / MDM / ASSESSMENT AND PLAN / ED COURSE  I reviewed the triage vital signs and the nursing notes.   Patient here with left ankle injury.     DIFFERENTIAL DIAGNOSIS (includes but not limited to):   Sprain, fracture, dislocation  Patient's presentation is most consistent with acute complicated illness / injury requiring diagnostic workup.  PLAN: X-ray obtained from triage reviewed and interpreted by myself and the radiologist and shows no fracture or dislocation.  Suspect sprain.  Will place Ace wrap, recommended elevation,  ice.  Will provide crutches so that she can use this to get up from her wheelchair to use the bathroom.  She would prefer discharge home and will follow-up with her podiatrist.  Will provide pain medication here in the emergency department as well as a prescription.  Discussed return precautions.  Patient verbalized understanding.  She is neurovascular intact distally with no other sign of injury on exam.   MEDICATIONS GIVEN IN ED: Medications  HYDROcodone-acetaminophen (NORCO/VICODIN) 5-325 MG per tablet 2 tablet (2 tablets Oral Given 09/22/23 0129)  ondansetron (ZOFRAN-ODT) disintegrating tablet 4 mg (4 mg Oral Given 09/22/23 0128)     ED COURSE:  At this time, I do not feel there is any life-threatening condition present. I reviewed all nursing notes, vitals, pertinent previous records.  All lab and urine results, EKGs, imaging ordered have been independently reviewed and interpreted by myself.  I reviewed all available radiology reports from any imaging ordered this visit.  Based on my assessment, I feel the patient is safe to be discharged home without further emergent workup and can continue workup as an outpatient as needed. Discussed all findings, treatment plan  as well as usual and customary return precautions.  They verbalize understanding and are comfortable with this plan.  Outpatient follow-up has been provided as needed.  All questions have been answered.    CONSULTS:  none   OUTSIDE RECORDS REVIEWED: Reviewed last podiatry note on 07/09/2023.     FINAL CLINICAL IMPRESSION(S) / ED DIAGNOSES   Final diagnoses:  Sprain of left ankle, unspecified ligament, initial encounter     Rx / DC Orders   ED Discharge Orders          Ordered    HYDROcodone-acetaminophen (NORCO/VICODIN) 5-325 MG tablet  Every 8 hours PRN        09/22/23 0129    ondansetron (ZOFRAN-ODT) 4 MG disintegrating tablet  Every 6 hours PRN        09/22/23 0129             Note:  This document was prepared using Dragon voice recognition software and may include unintentional dictation errors.   Keeanna Villafranca, Layla Maw, DO 09/22/23 [phone]

## 2023-09-22 NOTE — ED Notes (Signed)
 Daughter Magda Paganini called to inform that pt is ready to be picked up

## 2023-09-22 NOTE — ED Notes (Signed)
 Fall band applied, yellow socks applied, pt remains within eye sight of triage staff at this time due to patient being a high fall risk.

## 2023-09-22 NOTE — Discharge Instructions (Addendum)
 Please follow-up with your podiatrist.  You are being provided a prescription for opiates (also known as narcotics) for pain control.  Opiates can be addictive and should only be used when absolutely necessary for pain control when other alternatives do not work.  We recommend you only use them for the recommended amount of time and only as prescribed.  Please do not take with other sedative medications or alcohol.  Please do not drive, operate machinery, make important decisions while taking opiates.  Please note that these medications can be addictive and have high abuse potential.  Patients can become addicted to narcotics after only taking them for a few days.  Please keep these medications locked away from children, teenagers or any family members with history of substance abuse.  Narcotic pain medicine may also make you constipated.  You may use over-the-counter medications such as MiraLAX, Colace to prevent constipation.  If you become constipated, you may use over-the-counter enemas as needed.  Itching and nausea are also common side effects of narcotic pain medication.  If you develop uncontrolled vomiting or a rash, please stop these medications and seek medical care.

## 2023-09-22 NOTE — ED Notes (Signed)
 Magda Paganini (Daughter) [phone]

## 2023-09-24 ENCOUNTER — Ambulatory Visit (INDEPENDENT_AMBULATORY_CARE_PROVIDER_SITE_OTHER): Admitting: Podiatry

## 2023-09-24 ENCOUNTER — Encounter: Payer: Self-pay | Admitting: Podiatry

## 2023-09-24 ENCOUNTER — Ambulatory Visit: Payer: Medicare Other

## 2023-09-24 ENCOUNTER — Ambulatory Visit (INDEPENDENT_AMBULATORY_CARE_PROVIDER_SITE_OTHER)

## 2023-09-24 DIAGNOSIS — G90521 Complex regional pain syndrome I of right lower limb: Secondary | ICD-10-CM

## 2023-09-24 DIAGNOSIS — S82851A Displaced trimalleolar fracture of right lower leg, initial encounter for closed fracture: Secondary | ICD-10-CM | POA: Diagnosis not present

## 2023-09-24 DIAGNOSIS — S93402A Sprain of unspecified ligament of left ankle, initial encounter: Secondary | ICD-10-CM | POA: Diagnosis not present

## 2023-09-24 MED ORDER — GABAPENTIN 100 MG PO CAPS: 100.0000 mg | ORAL_CAPSULE | Freq: Three times a day (TID) | ORAL | 1 refills | Status: DC

## 2023-09-24 MED ORDER — HYDROCODONE-ACETAMINOPHEN 5-325 MG PO TABS: 2.0000 | ORAL_TABLET | Freq: Three times a day (TID) | ORAL | 0 refills | Status: DC | PRN

## 2023-09-24 NOTE — Progress Notes (Signed)
 Chief Complaint  Patient presents with   Fracture    "The right one was doing better with the physical therapy.  I wheeled down to the bathroom and I did my duck walk to get in and turned my left foot the wrong way." N - foot pain L - left ankle lateral D - Saturday, March 30 O - suddenly C - sharp pain, swelling, bruised A - putting sock on, walking, lay it a certain way T - ER, pain medication - Hydrocodone, Odansetron: RICE     Subjective:  Patient presents today status post ORIF with additional external fixation right ankle fracture performed inpatient.  DOS: 01/17/2023.    On 09/22/2023 the patient sustained a fall injury, injuring her left foot and ankle.  She is very frustrated because she says that she was getting significant improvement to her right foot and ankle with physical therapy.  She has been going to [facility] physical sports and rehab and she has been very satisfied with the progress made.  Past Medical History:  Diagnosis Date   Benign essential HTN    Chronic gastritis without bleeding    Closed fracture of right proximal humerus 01/17/2023   Closed trimalleolar fracture of right ankle 01/17/2023   Colitis    Hypercholesterolemia    Hypokalemia    Mild anemia    Pulmonary nodules    Rheumatoid arthritis, seropositive (HCC)    Thrombocytosis     Past Surgical History:  Procedure Laterality Date   APPENDECTOMY     BREAST BIOPSY Left    CERVICAL CONE BIOPSY     COLONOSCOPY WITH ESOPHAGOGASTRODUODENOSCOPY (EGD)  04/17/2019   EXTERNAL FIXATION REMOVAL Right 03/08/2023   Procedure: REMOVAL EXTERNAL FIXATION RIGHT ANKLE;  Surgeon: [name], DPM;  Location: [facility];  Service: Orthopedics/Podiatry;  Laterality: Right;   NASAL SINUS SURGERY     ORIF ANKLE FRACTURE Right 01/17/2023   Procedure: OPEN REDUCTION INTERNAL FIXATION (ORIF) ANKLE FRACTURE;  Surgeon: [name], DPM;  Location: [facility];  Service: Orthopedics/Podiatry;  Laterality: Right;    OVARIAN CYST SURGERY      Allergies  Allergen Reactions   Banana Anaphylaxis and Hives   Ciprofloxacin Rash   Lactose Intolerance (Gi)    Codeine Rash    Causes itching and blisters     Objective/Physical Exam Right foot and ankle unchanged.  There continues to be some tenderness to palpation throughout the foot and ankle but there is some improvement as far as muscle tone and range of motion. Ecchymosis with edema noted diffusely throughout the left foot and ankle.  No open lacerations noted.  Radiographic Exam RT foot and ankle 05/07/2023:  Mostly unchanged.  severe mottled 'washed out' appearance of the bones of the foot noted consistent with Sudex atrophy.  Radiographically favors onset of CRPS.   DG Ankle Complete Left 09/22/2023 IMPRESSION: No acute fracture or dislocation. Swelling about the lateral malleolus.  Assessment: 1. s/p ORIF with external fixation right ankle fracture. DOS: 01/17/2023 2. S/p removal external fixator.  DOS: 03/08/2023 3.  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  X-rays reviewed -Recommend Ace wrap with elevation rest and ice left foot -Short CAM boot dispensed left lower extremity -Patient may continue physical therapy for her right lower extremity.  She states that she was actually getting significant improvement and she was very optimistic with the physical therapy that she was doing at [facility] physical sports and rehab.  She is very satisfied and happy with that location and facility  -She may weight-bear as tolerated to the left lower extremity in the cam boot with the assistance of a walker.  -Refill prescription for Vicodin 5/325 mg #30 Q4H PRN pain -Also prescription for gabapentin 100 mg TID -Return to clinic with me 8 weeks  [name], DPM [facility]  Dr. [name], DPM    [address] [place], [place] [address]                Office [phone]  Fax [phone]

## 2023-09-26 ENCOUNTER — Ambulatory Visit: Payer: Medicare Other

## 2023-10-01 ENCOUNTER — Ambulatory Visit: Payer: Medicare Other

## 2023-10-01 ENCOUNTER — Ambulatory Visit: Payer: Medicare Other | Admitting: Podiatry

## 2023-10-08 ENCOUNTER — Ambulatory Visit: Payer: Medicare Other | Admitting: Podiatry

## 2023-10-15 ENCOUNTER — Encounter

## 2023-10-17 ENCOUNTER — Encounter

## 2023-10-23 ENCOUNTER — Encounter

## 2023-10-25 ENCOUNTER — Encounter

## 2023-11-06 ENCOUNTER — Other Ambulatory Visit: Payer: Self-pay | Admitting: Podiatry

## 2023-11-07 ENCOUNTER — Telehealth: Payer: Self-pay | Admitting: Podiatry

## 2023-11-07 NOTE — Telephone Encounter (Signed)
Patient is calling for a pain medication refill.

## 2023-11-08 ENCOUNTER — Other Ambulatory Visit: Payer: Self-pay | Admitting: Podiatry

## 2023-11-08 MED ORDER — HYDROCODONE-ACETAMINOPHEN 5-325 MG PO TABS: 2.0000 | ORAL_TABLET | Freq: Three times a day (TID) | ORAL | 0 refills | Status: DC | PRN

## 2023-11-08 MED ORDER — GABAPENTIN 100 MG PO CAPS: 100.0000 mg | ORAL_CAPSULE | Freq: Three times a day (TID) | ORAL | 1 refills | Status: DC

## 2023-11-19 ENCOUNTER — Ambulatory Visit: Admitting: Podiatry

## 2023-12-06 ENCOUNTER — Telehealth: Payer: Self-pay

## 2023-12-06 ENCOUNTER — Ambulatory Visit (INDEPENDENT_AMBULATORY_CARE_PROVIDER_SITE_OTHER)

## 2023-12-06 ENCOUNTER — Encounter: Payer: Self-pay | Admitting: Podiatry

## 2023-12-06 ENCOUNTER — Ambulatory Visit (INDEPENDENT_AMBULATORY_CARE_PROVIDER_SITE_OTHER): Admitting: Podiatry

## 2023-12-06 DIAGNOSIS — G90521 Complex regional pain syndrome I of right lower limb: Secondary | ICD-10-CM | POA: Diagnosis not present

## 2023-12-06 DIAGNOSIS — S93402A Sprain of unspecified ligament of left ankle, initial encounter: Secondary | ICD-10-CM | POA: Diagnosis not present

## 2023-12-06 DIAGNOSIS — M7751 Other enthesopathy of right foot: Secondary | ICD-10-CM

## 2023-12-06 DIAGNOSIS — S93602D Unspecified sprain of left foot, subsequent encounter: Secondary | ICD-10-CM | POA: Diagnosis not present

## 2023-12-06 DIAGNOSIS — M778 Other enthesopathies, not elsewhere classified: Secondary | ICD-10-CM

## 2023-12-06 MED ORDER — HYDROCODONE-ACETAMINOPHEN 5-325 MG PO TABS: 2.0000 | ORAL_TABLET | Freq: Three times a day (TID) | ORAL | 0 refills | Status: AC | PRN

## 2023-12-06 NOTE — Progress Notes (Signed)
 Chief Complaint  Patient presents with   Foot Pain    Pt is here due to left ankle pain she states she can't walk on it without the boot, still in constant pain.    Subjective:  Patient presents today status post ORIF with additional external fixation right ankle fracture performed inpatient.  DOS: 01/17/2023.    On 09/22/2023 the patient sustained a fall injury, injuring her left foot and ankle.  She is very frustrated because she says that she was getting significant improvement to her right foot and ankle with physical therapy.  She has been going to [facility] physical sports and rehab and she has been very satisfied with the progress made.  No change since last visit  Past Medical History:  Diagnosis Date   Benign essential HTN    Chronic gastritis without bleeding    Closed fracture of right proximal humerus 01/17/2023   Closed trimalleolar fracture of right ankle 01/17/2023   Colitis    Hypercholesterolemia    Hypokalemia    Mild anemia    Pulmonary nodules    Rheumatoid arthritis, seropositive (HCC)    Thrombocytosis     Past Surgical History:  Procedure Laterality Date   APPENDECTOMY     BREAST BIOPSY Left    CERVICAL CONE BIOPSY     COLONOSCOPY WITH ESOPHAGOGASTRODUODENOSCOPY (EGD)  04/17/2019   EXTERNAL FIXATION REMOVAL Right 03/08/2023   Procedure: REMOVAL EXTERNAL FIXATION RIGHT ANKLE;  Surgeon: Dot Gazella, DPM;  Location: [facility];  Service: Orthopedics/Podiatry;  Laterality: Right;   NASAL SINUS SURGERY     ORIF ANKLE FRACTURE Right 01/17/2023   Procedure: OPEN REDUCTION INTERNAL FIXATION (ORIF) ANKLE FRACTURE;  Surgeon: Dot Gazella, DPM;  Location: [facility];  Service: Orthopedics/Podiatry;  Laterality: Right;   OVARIAN CYST SURGERY      Allergies  Allergen Reactions   Banana Anaphylaxis and Hives   Ciprofloxacin  Rash   Lactose Intolerance (Gi)    Codeine Rash    Causes itching and blisters     Objective/Physical Exam Mostly unchanged.  Right foot  and ankle unchanged.  There continues to be some tenderness to palpation throughout the foot and ankle but there is some improvement as far as muscle tone and range of motion. Ecchymosis and edema resolved left foot and ankle.  Radiographic Exam RT foot and ankle 12/06/2023:  Mostly unchanged.  severe mottled 'washed out' appearance of the bones of the foot noted consistent with Sudex atrophy.  Radiographically favors onset of CRPS.   DG Ankle Complete Left 09/22/2023 IMPRESSION: No acute fracture or dislocation. Swelling about the lateral malleolus.  Radiographic exam LT foot and ankle 12/06/2023: No acute fracture or dislocation.  No edema or swelling noted.  Impression: Negative  Assessment: 1. s/p ORIF with external fixation right ankle fracture. DOS: 01/17/2023 2. S/p removal external fixator.  DOS: 03/08/2023 3.  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  Left foot sprain.  DOI: 09/22/2023  Plan of Care:  -Patient was evaluated.  X-rays reviewed -Refill prescription for Vicodin 5/325 mg #30 Q4H PRN pain - Continue gabapentin  100 mg TID in combination with Cymbalta  20 mg daily -New order was placed for referral to pain management as well as a referral to physical therapy to reinitiate physical therapy.  Reports good success with physical therapy -Return to clinic with me in 3 months Dot Gazella, DPM [facility]  Dr. Dot Gazella, DPM    [address]  [place], [place] [address]                Office [phone]  Fax [phone]

## 2023-12-06 NOTE — Telephone Encounter (Signed)
 Patient called and left a message - said that 3 prescriptions were supposed to be called in for her  - please send prescriptions

## 2023-12-09 ENCOUNTER — Telehealth: Payer: Self-pay | Admitting: Podiatry

## 2023-12-09 NOTE — Telephone Encounter (Signed)
 Error

## 2023-12-09 NOTE — Telephone Encounter (Signed)
 Patient called stating she saw Dr Luster Salters and was suppose to get 3 RX refills. Patient states she only had one at pharmacy.

## 2023-12-17 ENCOUNTER — Ambulatory Visit: Admitting: Podiatry

## 2023-12-19 ENCOUNTER — Ambulatory Visit: Admitting: Student in an Organized Health Care Education/Training Program

## 2024-01-09 ENCOUNTER — Ambulatory Visit: Admitting: Student in an Organized Health Care Education/Training Program

## 2024-01-21 ENCOUNTER — Ambulatory Visit (INDEPENDENT_AMBULATORY_CARE_PROVIDER_SITE_OTHER): Admitting: Podiatry

## 2024-01-21 VITALS — Ht 63.5 in | Wt 117.6 lb

## 2024-01-21 DIAGNOSIS — S82851A Displaced trimalleolar fracture of right lower leg, initial encounter for closed fracture: Secondary | ICD-10-CM

## 2024-01-21 NOTE — Progress Notes (Signed)
 Chief Complaint  Patient presents with   Foot Pain    Pt is here to f/u on left foot/ankle she states she is still in pain gives it a 8 out of 10, continues to wear cam boot.    Subjective:  Patient presents today status post ORIF with additional external fixation right ankle fracture performed inpatient.  DOS: 01/17/2023.    On 09/22/2023 the patient sustained a fall injury, injuring her left foot and ankle.  She is very frustrated because she says that she was getting significant improvement to her right foot and ankle with physical therapy.  She has been going to [facility] physical sports and rehab and she has been very satisfied with the progress made.  Patient has since discontinued physical therapy.  She has a hard time making it to the appointments.  She also has not made an appointment with pain management although they have reached out to her multiple occasions.  She says that overall she has noticed improvement to the ankle and foot  Past Medical History:  Diagnosis Date   Benign essential HTN    Chronic gastritis without bleeding    Closed fracture of right proximal humerus 01/17/2023   Closed trimalleolar fracture of right ankle 01/17/2023   Colitis    Hypercholesterolemia    Hypokalemia    Mild anemia    Pulmonary nodules    Rheumatoid arthritis, seropositive (HCC)    Thrombocytosis     Past Surgical History:  Procedure Laterality Date   APPENDECTOMY     BREAST BIOPSY Left    CERVICAL CONE BIOPSY     COLONOSCOPY WITH ESOPHAGOGASTRODUODENOSCOPY (EGD)  04/17/2019   EXTERNAL FIXATION REMOVAL Right 03/08/2023   Procedure: REMOVAL EXTERNAL FIXATION RIGHT ANKLE;  Surgeon: Janit Thresa HERO, DPM;  Location: [facility];  Service: Orthopedics/Podiatry;  Laterality: Right;   NASAL SINUS SURGERY     ORIF ANKLE FRACTURE Right 01/17/2023   Procedure: OPEN REDUCTION INTERNAL FIXATION (ORIF) ANKLE FRACTURE;  Surgeon: Janit Thresa HERO, DPM;  Location: [facility];  Service: Orthopedics/Podiatry;   Laterality: Right;   OVARIAN CYST SURGERY      Allergies  Allergen Reactions   Banana Anaphylaxis and Hives   Ciprofloxacin  Rash   Lactose Intolerance (Gi)    Codeine Rash    Causes itching and blisters     Objective/Physical Exam Significant improvement of the appearance of the foot and ankle.  Skin is soft and supple and warm.  Pulses palpable.  Improved sensitivity with palpation throughout the foot and ankle.  Muscle strength 5/5 all compartments.  No edema or erythema noted today  Radiographic Exam RT foot and ankle 12/06/2023:  Mostly unchanged.  severe mottled 'washed out' appearance of the bones of the foot noted consistent with Sudex atrophy.  Radiographically favors onset of CRPS.   DG Ankle Complete Left 09/22/2023 IMPRESSION: No acute fracture or dislocation. Swelling about the lateral malleolus.  Radiographic exam LT foot and ankle 12/06/2023: No acute fracture or dislocation.  No edema or swelling noted.  Impression: Negative  Assessment: 1. s/p ORIF with external fixation right ankle fracture. DOS: 01/17/2023 2. S/p removal external fixator.  DOS: 03/08/2023 3.  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  Left foot sprain.  DOI: 09/22/2023  Plan of Care:  -Patient was evaluated.  -Request was made last visit to reinitiate physical therapy however the patient has ultimately declined physical therapy.  She says that she can do physical therapy at home -Also declined appointment with  pain management -She states that she did not pick up the prescription for Vicodin or gabapentin .  No additional medications prescribed today -From a surgical standpoint the patient may transition out of the cam boot into tennis shoes.  She reports doing physical therapy at home.  Continue -Return to clinic 3 months follow-up x-ray  Thresa EMERSON Sar, DPM [facility]  Dr. Thresa EMERSON Sar, DPM    [address] [place], [place] [address]                 Office [phone]  Fax [phone]

## 2024-01-23 ENCOUNTER — Telehealth: Payer: Self-pay | Admitting: Podiatry

## 2024-01-23 NOTE — Telephone Encounter (Signed)
 Patient called stating she has a bone stimulator from last year. She states she hasn't used it but started to use it yesterday and its locked up. She states she called the company and they told her to reach out to the doctor.

## 2024-01-28 ENCOUNTER — Telehealth: Payer: Self-pay | Admitting: Podiatry

## 2024-01-28 NOTE — Telephone Encounter (Signed)
 Patient has question  about the ankle orthopedic growth device.  The company , after one year, has locked it and it won't work.  She wants to know what to do at this point.

## 2024-01-29 NOTE — Telephone Encounter (Signed)
 Left detailed message asked to call back if is in need of further assistance.

## 2024-03-06 ENCOUNTER — Ambulatory Visit: Admitting: Podiatry

## 2024-03-26 ENCOUNTER — Telehealth: Payer: Self-pay

## 2024-03-26 ENCOUNTER — Ambulatory Visit (LOCAL_COMMUNITY_HEALTH_CENTER): Payer: Self-pay

## 2024-03-26 DIAGNOSIS — R7612 Nonspecific reaction to cell mediated immunity measurement of gamma interferon antigen response without active tuberculosis: Secondary | ICD-10-CM

## 2024-03-26 NOTE — Telephone Encounter (Signed)
 Patient contacted [facility] for evaluation of TB as requested by her provider.   Left message message to return call.

## 2024-03-27 ENCOUNTER — Ambulatory Visit (LOCAL_COMMUNITY_HEALTH_CENTER): Payer: Self-pay | Admitting: Surgery

## 2024-03-27 ENCOUNTER — Encounter: Payer: Self-pay | Admitting: Surgery

## 2024-03-27 VITALS — Wt 115.0 lb

## 2024-03-27 DIAGNOSIS — Z111 Encounter for screening for respiratory tuberculosis: Secondary | ICD-10-CM

## 2024-03-27 NOTE — Telephone Encounter (Signed)
 Patient returned call.  See visit note for EPI.

## 2024-03-27 NOTE — Progress Notes (Signed)
 Patient is a 68 yo female TB suspect, referred by [facility] due to history of pulmonary lung nodules, poorly healing LE leg fracture and recent initiation of Humira, who had a positive QFT in August of this year. She has not had a recent chest x-ray.  Her current weight is 115 lbs with clothes and leg boot on. She denies any symptoms at this time, no cough, no sputum production, no fevers, chills nor night sweats.   CT of chest is pending.   Obtained repeat QFT today and placed order for PA and lateral CXR to serve as baseline moving forward.   Patient denies cough, and thus does not have a productive cough. Patient tried to produce natural sputum and could not.   Patient attempted induced sputum, used nebulizer with saline mist for 10 minutes, coughed a little but could not produce sputum.   Patient sent home with 3 collection tubes in case she can collect sputum at home. Given handout with visual instructions for collecting sputums and instructed to refrigerate any specimens she can collect until we can get them to the health department.   Patient was also given LTBI versus Active educational flier.   HERLENE DELON HERO, MD

## 2024-03-27 NOTE — Progress Notes (Signed)
 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  EPI completed today 03/26/2024. She denies any signs or symptoms of TB, however reports she had an appt with [facility] today due to her positive QFT and they recommended sputums to rule out pulmonary TB.  She was unclear as to why they had this recommendation other than reporting the positive QFT, history of lung nodules, other abnormal lab work and recent foot fracture.  Medical History includes HTN, high cholesterol, Rheumatoid Arthritis, see problem list for complete list.  Medications include Methotrexate , Humira, Folic Acid , Lisinopril hydrochlorothiazide, Pravastatin , Potassium Chloride , Goody Powder, Lumigan eye drops.  As per patient report.  Reports she smokes .5 - 1 pack a day. Reports she drink 2 beers a day.  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  Consulted with TB Medical Director, Dr. Delon Leghorn, appt scheduled 03/27/2024 at 10:00 am with Dr. Leghorn for sputum collection.    Becky Ward is aware of plan to collect 3 sputum specimens for AFB and Culture, first being at the [facility] 03/27/2024 and 2 additional sputums 03/28/2024 and 03/29/2024.  Will follow up with patient after preliminary results are back early next week.

## 2024-04-02 LAB — QUANTIFERON-TB GOLD PLUS
QuantiFERON Mitogen Value: >10 [IU]/mL
QuantiFERON Nil Value: 0.22 [IU]/mL
QuantiFERON TB1 Ag Value: 0.42 [IU]/mL
QuantiFERON TB2 Ag Value: 0.44 [IU]/mL
QuantiFERON-TB Gold Plus: NEGATIVE

## 2024-04-03 ENCOUNTER — Encounter: Payer: Self-pay | Admitting: Surgery

## 2024-04-03 NOTE — Progress Notes (Signed)
 PATIENT'S REPEAT QFT WAS NEGATIVE:  Patient was seen at [facility] 03/26/2024 by Arley CHRISTELLA Free, MD and Gareth Camille Patricia Arsenio, MD and referred to [facility] as a TB suspect for sputum collection. Attempted to call providers with update at phone listed in provider note: [phone] but there was no answer and no voicemail. Attempted paging system as well [phone]) but did not accept callback number.   EPI screen was performed on 03/26/2024 which did not reveal any high risk factors for TB exposure (did live in [place] for short time) and patient did not complain of any concerning symptoms other than a dry cough.    The patient was seen at [facility] on 03/27/2024.  Patient denied cough, and thus does not have a productive cough at present. Patient tried to produce natural sputum and could not.    Patient attempted induced sputum, used nebulizer with saline mist for 10 minutes, coughed a little but could not produce sputum.    Patient was sent home with 3 collection tubes in case she can collect sputum at home. She was given a handout with visual instructions for collecting sputums and instructed to refrigerate any specimens she can collect until we can get them to the health department.    Patient was also given LTBI versus Active educational flier.   QFT results from 03/27/2024 were negative: QuantiFERON-TB Gold Plus Order: [id number]  Status: Final result     Next appt: 04/21/2024 at 10:15 AM in Podiatry [place] CHRISTELLA Sar, DPM)     Dx: Screening for tuberculosis   Test Result Released: Yes (not seen)   0 Result Notes    Component Ref Range & Units (hover) 7 d ago  QuantiFERON Incubation Incubation performed.  QuantiFERON Criteria Comment  Comment: QuantiFERON-TB Gold Plus is a qualitative indirect test for M tuberculosis infection (including disease) and is intended for use in conjunction with risk assessment, radiography, and other medical and diagnostic evaluations. The QuantiFERON-TB Gold Plus  result is determined by subtracting the Nil value from either TB antigen (Ag) value. The Mitogen tube serves as a control for the test.  QuantiFERON TB1 Ag Value 0.42  QuantiFERON TB2 Ag Value 0.44  QuantiFERON Nil Value 0.22  QuantiFERON Mitogen Value >10.00  QuantiFERON-TB Gold Plus Negative     Since patient cannot produce sputum with induction, patient will need bronchoscopy and/or biopsy if concern for active TB remains. Defer to [facility] for further procedural workup.   We would still like patient to undergo CXR 2 view PA/lateral as we would need CXR imaging to have as a baseline should LTBI/TB be addressed in the future. An order has been placed and the patient can undergo imaging free of charge at [facility] outpatient radiology clinic. The patient has not yet gone for imaging.   Please contact the TB coordinator at [phone] if any additional information is needed. If the patient is able to produce sputum at some point in the future, we will send it to the [facility] for analysis.   HERLENE DELON CHRISTELLA, MD

## 2024-04-10 ENCOUNTER — Ambulatory Visit: Payer: Self-pay | Admitting: Surgery

## 2024-04-10 NOTE — Telephone Encounter (Signed)
 Spoke with patient this morning, informed of negative follow up QFT.  Denies cough or any other symptoms, unable to produce sputum. Patient aware to follow up with [facility].

## 2024-04-21 ENCOUNTER — Encounter: Payer: Self-pay | Admitting: Podiatry

## 2024-04-21 ENCOUNTER — Ambulatory Visit

## 2024-04-21 ENCOUNTER — Ambulatory Visit: Admitting: Podiatry

## 2024-04-21 DIAGNOSIS — S93402A Sprain of unspecified ligament of left ankle, initial encounter: Secondary | ICD-10-CM | POA: Diagnosis not present

## 2024-04-21 DIAGNOSIS — S82851A Displaced trimalleolar fracture of right lower leg, initial encounter for closed fracture: Secondary | ICD-10-CM

## 2024-04-21 DIAGNOSIS — S93602D Unspecified sprain of left foot, subsequent encounter: Secondary | ICD-10-CM

## 2024-04-21 MED ORDER — GABAPENTIN 300 MG PO CAPS: 300.0000 mg | ORAL_CAPSULE | Freq: Three times a day (TID) | ORAL | 1 refills | Status: DC

## 2024-04-21 MED ORDER — DULOXETINE HCL 20 MG PO CPEP: 20.0000 mg | ORAL_CAPSULE | Freq: Every day | ORAL | 3 refills | Status: AC

## 2024-04-21 NOTE — Progress Notes (Signed)
 Chief Complaint  Patient presents with   Foot Injury    Pt is here to f/u on right foot, she states that it is not better continues to wear cam boot, states she wish she would have done physical therapy and pain management as instructed.    Subjective:  Patient presents today status post ORIF with additional external fixation right ankle fracture performed inpatient.  DOS: 01/17/2023.    On 09/22/2023 the patient sustained a fall injury, injuring her left foot and ankle.  She is very frustrated because she says that she was getting significant improvement to her right foot and ankle with physical therapy.  She has been going to [facility] physical sports and rehab and she has been very satisfied with the progress made.  Since the injury she has had a long history of noncompliance and missed appointments with physical therapy.  We also attempted to get her over to pain management but she did not follow-up on those appointments either.  Past Medical History:  Diagnosis Date   Benign essential HTN    Chronic gastritis without bleeding    Closed fracture of right proximal humerus 01/17/2023   Closed trimalleolar fracture of right ankle 01/17/2023   Colitis    Hypercholesterolemia    Hypokalemia    Mild anemia    Pulmonary nodules    Rheumatoid arthritis, seropositive (HCC)    Thrombocytosis     Past Surgical History:  Procedure Laterality Date   APPENDECTOMY     BREAST BIOPSY Left    CERVICAL CONE BIOPSY     COLONOSCOPY WITH ESOPHAGOGASTRODUODENOSCOPY (EGD)  04/17/2019   EXTERNAL FIXATION REMOVAL Right 03/08/2023   Procedure: REMOVAL EXTERNAL FIXATION RIGHT ANKLE;  Surgeon: Janit Thresa HERO, DPM;  Location: [facility];  Service: Orthopedics/Podiatry;  Laterality: Right;   NASAL SINUS SURGERY     ORIF ANKLE FRACTURE Right 01/17/2023   Procedure: OPEN REDUCTION INTERNAL FIXATION (ORIF) ANKLE FRACTURE;  Surgeon: Janit Thresa HERO, DPM;  Location: [facility];  Service: Orthopedics/Podiatry;   Laterality: Right;   OVARIAN CYST SURGERY      Allergies  Allergen Reactions   Banana Anaphylaxis and Hives   Ciprofloxacin  Rash   Lactose Intolerance (Gi)    Codeine Rash    Causes itching and blisters     Objective/Physical Exam Stablethe appearance of the foot and ankle.  Skin is soft and supple and warm.  Pulses palpable.  Improved sensitivity with palpation throughout the foot and ankle.  Muscle strength 5/5 all compartments.  No edema or erythema noted today  Radiographic Exam RT foot and ankle 04/21/2024:  Unchanged.  Stable.  Severe mottled 'washed out' appearance of the bones of the foot noted consistent with Sudex atrophy.  Radiographically favors onset of CRPS.    Radiographic exam LT foot and ankle 04/21/2024: No acute fracture or dislocation.  No edema or swelling noted.  Impression: Negative  Assessment: 1. s/p ORIF with external fixation right ankle fracture. DOS: 01/17/2023 2. S/p removal external fixator.  DOS: 03/08/2023 3.  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  Left foot sprain.  DOI: 09/22/2023  Plan of Care:  -Patient was evaluated.  - The patient has not reinitiated physical therapy.  She only attended a few times.  New order was placed for physical therapy at [facility].  She is requesting this and she would like to go to help improve her gait -Gabapentin  and Cymbalta  combination also helps with the CRPS.  Refill prescription for  gabapentin  300 mg TID as well as Cymbalta  20 mg daily -Return to clinic 6 months  Thresa EMERSON Sar, DPM [facility]  Dr. Thresa EMERSON Sar, DPM    [address] [place], [place] [address]                Office [phone]  Fax [phone]

## 2024-04-23 ENCOUNTER — Telehealth: Payer: Self-pay | Admitting: Lab

## 2024-04-23 NOTE — Telephone Encounter (Signed)
 Patient calling with question about leaving hardware in foot and not having it removed please contact patient to discuss.

## 2024-05-06 ENCOUNTER — Telehealth: Payer: Self-pay | Admitting: Lab

## 2024-05-06 NOTE — Telephone Encounter (Signed)
 Patient is requesting home heath therapy due to transportation issues.

## 2024-05-07 ENCOUNTER — Telehealth: Payer: Self-pay | Admitting: Lab

## 2024-05-07 NOTE — Telephone Encounter (Signed)
 Patient is calling wanting physical therapy order in home through [facility].

## 2024-05-11 ENCOUNTER — Other Ambulatory Visit: Payer: Self-pay | Admitting: Podiatry

## 2024-06-08 ENCOUNTER — Other Ambulatory Visit: Payer: Self-pay | Admitting: Podiatry

## 2024-06-23 ENCOUNTER — Other Ambulatory Visit: Payer: Self-pay

## 2024-06-23 ENCOUNTER — Emergency Department: Admission: EM | Admit: 2024-06-23 | Discharge: 2024-06-23 | Disposition: A | Discharge status: Home / Self Care

## 2024-06-23 ENCOUNTER — Emergency Department

## 2024-06-23 DIAGNOSIS — H6691 Otitis media, unspecified, right ear: Secondary | ICD-10-CM | POA: Insufficient documentation

## 2024-06-23 DIAGNOSIS — J069 Acute upper respiratory infection, unspecified: Secondary | ICD-10-CM | POA: Diagnosis not present

## 2024-06-23 DIAGNOSIS — Z720 Tobacco use: Secondary | ICD-10-CM | POA: Diagnosis not present

## 2024-06-23 DIAGNOSIS — E876 Hypokalemia: Secondary | ICD-10-CM | POA: Diagnosis not present

## 2024-06-23 DIAGNOSIS — R6889 Other general symptoms and signs: Secondary | ICD-10-CM

## 2024-06-23 DIAGNOSIS — H6690 Otitis media, unspecified, unspecified ear: Secondary | ICD-10-CM

## 2024-06-23 DIAGNOSIS — R059 Cough, unspecified: Secondary | ICD-10-CM | POA: Diagnosis present

## 2024-06-23 DIAGNOSIS — I10 Essential (primary) hypertension: Secondary | ICD-10-CM | POA: Diagnosis not present

## 2024-06-23 LAB — COMPREHENSIVE METABOLIC PANEL WITH GFR
ALT: 69 U/L — ABNORMAL HIGH (ref 0–44)
AST: 98 U/L — ABNORMAL HIGH (ref 15–41)
Albumin: 4 g/dL (ref 3.5–5.0)
Alkaline Phosphatase: 95 U/L (ref 38–126)
Anion gap: 12 (ref 5–15)
BUN: 12 mg/dL (ref 8–23)
CO2: 24 mmol/L (ref 22–32)
Calcium: 8.2 mg/dL — ABNORMAL LOW (ref 8.9–10.3)
Chloride: 102 mmol/L (ref 98–111)
Creatinine, Ser: 0.57 mg/dL (ref 0.44–1.00)
GFR, Estimated: >60 mL/min
Glucose, Bld: 102 mg/dL — ABNORMAL HIGH (ref 70–99)
Potassium: 3.3 mmol/L — ABNORMAL LOW (ref 3.5–5.1)
Sodium: 138 mmol/L (ref 135–145)
Total Bilirubin: 0.3 mg/dL (ref 0.0–1.2)
Total Protein: 6.3 g/dL — ABNORMAL LOW (ref 6.5–8.1)

## 2024-06-23 LAB — CBC
HCT: 34.9 % — ABNORMAL LOW (ref 36.0–46.0)
Hemoglobin: 11.4 g/dL — ABNORMAL LOW (ref 12.0–15.0)
MCH: 32.2 pg (ref 26.0–34.0)
MCHC: 32.7 g/dL (ref 30.0–36.0)
MCV: 98.6 fL (ref 80.0–100.0)
Platelets: 333 K/uL (ref 150–400)
RBC: 3.54 MIL/uL — ABNORMAL LOW (ref 3.87–5.11)
RDW: 15.2 % (ref 11.5–15.5)
WBC: 4.6 K/uL (ref 4.0–10.5)
nRBC: 0 % (ref 0.0–0.2)

## 2024-06-23 LAB — LACTIC ACID, PLASMA: Lactic Acid, Venous: 0.9 mmol/L (ref 0.5–1.9)

## 2024-06-23 LAB — LIPASE, BLOOD: Lipase: 17 U/L (ref 11–51)

## 2024-06-23 MED ORDER — ACETAMINOPHEN 500 MG PO TABS
1000.0000 mg | ORAL_TABLET | Freq: Once | ORAL | Status: AC
  Administered 2024-06-23: 1000 mg via ORAL
  Filled 2024-06-23: qty 2

## 2024-06-23 MED ORDER — SODIUM CHLORIDE 0.9 % IV BOLUS
1000.0000 mL | Freq: Once | INTRAVENOUS | Status: AC
  Administered 2024-06-23: 1000 mL via INTRAVENOUS

## 2024-06-23 MED ORDER — CEFDINIR 300 MG PO CAPS: 300.0000 mg | ORAL_CAPSULE | Freq: Two times a day (BID) | ORAL | 0 refills | Status: AC

## 2024-06-23 MED ORDER — ONDANSETRON 4 MG PO TBDP: 4.0000 mg | ORAL_TABLET | Freq: Three times a day (TID) | ORAL | 0 refills | Status: AC | PRN

## 2024-06-23 MED ORDER — ACETAMINOPHEN 500 MG PO TABS: 1000.0000 mg | ORAL_TABLET | Freq: Four times a day (QID) | ORAL | 2 refills | Status: AC | PRN

## 2024-06-23 MED ORDER — PROCHLORPERAZINE EDISYLATE 10 MG/2ML IJ SOLN
10.0000 mg | Freq: Once | INTRAMUSCULAR | Status: AC
  Administered 2024-06-23: 10 mg via INTRAVENOUS
  Filled 2024-06-23: qty 2

## 2024-06-23 NOTE — Discharge Instructions (Addendum)
 Your evaluation in the emergency department was overall reassuring.  I do suspect you may have influenza.  It is also possible you may have an ear infection, and I have started you on an antibiotic to treat this--please take the full course as prescribed.  I have also prescribed a nausea medication and Tylenol  for pain or fever (which you can use in addition to Motrin.)  Return to the emergency department with any new or worsening symptoms.

## 2024-06-23 NOTE — ED Provider Notes (Signed)
 "  [facility] Provider Note    Event Date/Time   First MD Initiated Contact with Patient 06/23/24 0530     (approximate)   History   Headache  Pt reports headache and right ear pain, pt reports for the past few days she has had cough congestion n/v/d.    HPI Becky Ward is a 68 y.o. female 68 year old female PMH hypertension, chronic gastritis, tobacco use presents for evaluation of headache, ear discomfort, nasal congestion - Symptoms present for the past 2 3 days.  Has had increasing pain in her right ear.  No preceding trauma.  Has had subjective fevers.  Notes significant nausea.  Denies any abdominal pain.  No urinary symptoms.  Also notes cough and nasal congestion.        Physical Exam   Triage Vital Signs: ED Triage Vitals  Encounter Vitals Group     BP 06/23/24 0454 131/79     Girls Systolic BP Percentile --      Girls Diastolic BP Percentile --      Boys Systolic BP Percentile --      Boys Diastolic BP Percentile --      Pulse Rate 06/23/24 0454 86     Resp 06/23/24 0454 18     Temp 06/23/24 0454 100.2 F (37.9 C)     Temp Source 06/23/24 0454 Oral     SpO2 06/23/24 0454 94 %     Weight 06/23/24 0453 110 lb (49.9 kg)     Height 06/23/24 0453 5' 4 (1.626 m)     Head Circumference --      Peak Flow --      Pain Score 06/23/24 0453 8     Pain Loc --      Pain Education --      Exclude from Growth Chart --     Most recent vital signs: Vitals:   06/23/24 0726 06/23/24 0728  BP: 125/72   Pulse: 65   Resp: 16   Temp:  99 F (37.2 C)  SpO2: 96%      General: Awake, no distress.  HEENT: Mild TM erythema right, left TM unremarkable.  No lymphadenopathy.  No tenderness or erythema over mastoid region. CV:  Good peripheral perfusion. RRR, RP 2+ Resp:  Normal effort. CTAB Abd:  No distention. Nontender to deep palpation throughout Neuro:  Aox4, CN II-XII intact, FNF wnl, finger taps fast b/l, 5/5 strength in bilateral finger  extension/grip, arm flexion/extension, EHL/FHL. BUE AG 10+ sec no drift, BLE AG 5+ sec no drift. Ambulates with steady gait. SILT. Negative Rhomberg.    ED Results / Procedures / Treatments   Labs (all labs ordered are listed, but only abnormal results are displayed) Labs Reviewed  COMPREHENSIVE METABOLIC PANEL WITH GFR - Abnormal; Notable for the following components:      Result Value   Potassium 3.3 (*)    Glucose, Bld 102 (*)    Calcium  8.2 (*)    Total Protein 6.3 (*)    AST 98 (*)    ALT 69 (*)    All other components within normal limits  CBC - Abnormal; Notable for the following components:   RBC 3.54 (*)    Hemoglobin 11.4 (*)    HCT 34.9 (*)    All other components within normal limits  LIPASE, BLOOD  LACTIC ACID, PLASMA  URINALYSIS, ROUTINE W REFLEX MICROSCOPIC     EKG  N/a   RADIOLOGY Radiology interpreted by myself radiology  report reviewed.  No acute pathology identified.    PROCEDURES:  Critical Care performed: No  Procedures   MEDICATIONS ORDERED IN ED: Medications  sodium chloride  0.9 % bolus 1,000 mL (0 mLs Intravenous Stopped 06/23/24 0723)  prochlorperazine  (COMPAZINE ) injection 10 mg (10 mg Intravenous Given 06/23/24 0627)  acetaminophen  (TYLENOL ) tablet 1,000 mg (1,000 mg Oral Given 06/23/24 9371)     IMPRESSION / MDM / ASSESSMENT AND PLAN / ED COURSE  I reviewed the triage vital signs and the nursing notes.                              DDX/MDM/AP: Differential diagnosis includes, but is not limited to, viral syndrome including influenza, consider otitis media, pneumonia, doubt intra-abdominal pathology or underlying UTI.  Plan: - labs - ivf - compazine  - cxr -Unfortunately critical shortage of testing not currently available in emergency department, will defer viral testing at this time - Reassess  Patient's presentation is most consistent with acute presentation with potential threat to life or bodily function.  The  patient is on the cardiac monitor to evaluate for evidence of arrhythmia and/or significant heart rate changes.  ED course below.  Patient feeling much better after fluid, Tylenol , Compazine .  Some ongoing right earache.  In shared decision making, we will treat empirically for possible early otitis media.  No obvious urinary symptoms history of recurrent UTI, will treat with cefdinir to cover both possible sources of infection.  Rx Zofran  in addition.  Repeat abdominal exam is benign, no clinical concern for acute intra-abdominal pathology.  Plan for PMD follow-up.  ED return precautions in place.  Patient agrees with plan.  Clinical Course as of 06/23/24 0753  Tue Jun 23, 2024  0604 CMP reviewed, mild transaminitis in alc hep pattern, bili wnl.  Mild hypokalemia.  CBC reviewed, unremarkable [MM]  0648 CXR wnl: IMPRESSION: 1. No acute process.   [MM]  [phone] Reevaluated, feeling much better after Compazine  Tylenol , and IV fluid.  In shared decision making, she does prefer empiric treatment of possible otitis media.  Denies any urinary symptoms at this time, will defer UA though given history of recurrent UTIs we will treat with antibiotic that would cover both possible infections.  Regarding overall presentation, I do suspect influenza, unfortunately critical shortage of flu test at this time--recommend symptomatic management.  Plan for PMD follow-up.  ED return precautions placed.  Patient agrees with plan. [MM]    Clinical Course User Index [MM] Clarine Ozell LABOR, MD     FINAL CLINICAL IMPRESSION(S) / ED DIAGNOSES   Final diagnoses:  Flu-like symptoms  Upper respiratory tract infection, unspecified type  Acute otitis media, unspecified otitis media type     Rx / DC Orders   ED Discharge Orders          Ordered    cefdinir (OMNICEF) 300 MG capsule  2 times daily        06/23/24 0719    ondansetron  (ZOFRAN -ODT) 4 MG disintegrating tablet  Every 8 hours PRN        06/23/24 0719     acetaminophen  (TYLENOL ) 500 MG tablet  Every 6 hours PRN        06/23/24 0719             Note:  This document was prepared using Dragon voice recognition software and may include unintentional dictation errors.   Clarine Ozell LABOR, MD 06/23/24 [phone]  "

## 2024-06-23 NOTE — ED Triage Notes (Signed)
 Pt reports headache and right ear pain, pt reports for the past few days she has had cough congestion n/v/d.

## 2024-07-09 ENCOUNTER — Emergency Department

## 2024-07-09 ENCOUNTER — Other Ambulatory Visit: Payer: Self-pay

## 2024-07-09 ENCOUNTER — Emergency Department
Admission: EM | Admit: 2024-07-09 | Discharge: 2024-07-09 | Disposition: A | Discharge status: Home / Self Care | Attending: Emergency Medicine | Admitting: Emergency Medicine

## 2024-07-09 DIAGNOSIS — K5792 Diverticulitis of intestine, part unspecified, without perforation or abscess without bleeding: Secondary | ICD-10-CM

## 2024-07-09 DIAGNOSIS — K5900 Constipation, unspecified: Secondary | ICD-10-CM | POA: Diagnosis not present

## 2024-07-09 DIAGNOSIS — K5732 Diverticulitis of large intestine without perforation or abscess without bleeding: Secondary | ICD-10-CM | POA: Diagnosis not present

## 2024-07-09 DIAGNOSIS — R1032 Left lower quadrant pain: Secondary | ICD-10-CM | POA: Diagnosis present

## 2024-07-09 LAB — COMPREHENSIVE METABOLIC PANEL WITH GFR
ALT: 55 U/L — ABNORMAL HIGH (ref 0–44)
AST: 60 U/L — ABNORMAL HIGH (ref 15–41)
Albumin: 4 g/dL (ref 3.5–5.0)
Alkaline Phosphatase: 108 U/L (ref 38–126)
Anion gap: 13 (ref 5–15)
BUN: 13 mg/dL (ref 8–23)
CO2: 21 mmol/L — ABNORMAL LOW (ref 22–32)
Calcium: 8.7 mg/dL — ABNORMAL LOW (ref 8.9–10.3)
Chloride: 108 mmol/L (ref 98–111)
Creatinine, Ser: 0.63 mg/dL (ref 0.44–1.00)
GFR, Estimated: >60 mL/min
Glucose, Bld: 113 mg/dL — ABNORMAL HIGH (ref 70–99)
Potassium: 3.7 mmol/L (ref 3.5–5.1)
Sodium: 142 mmol/L (ref 135–145)
Total Bilirubin: 0.2 mg/dL (ref 0.0–1.2)
Total Protein: 6.6 g/dL (ref 6.5–8.1)

## 2024-07-09 LAB — CBC
HCT: 36.8 % (ref 36.0–46.0)
Hemoglobin: 12 g/dL (ref 12.0–15.0)
MCH: 32.7 pg (ref 26.0–34.0)
MCHC: 32.6 g/dL (ref 30.0–36.0)
MCV: 100.3 fL — ABNORMAL HIGH (ref 80.0–100.0)
Platelets: 501 K/uL — ABNORMAL HIGH (ref 150–400)
RBC: 3.67 MIL/uL — ABNORMAL LOW (ref 3.87–5.11)
RDW: 15.9 % — ABNORMAL HIGH (ref 11.5–15.5)
WBC: 14.5 K/uL — ABNORMAL HIGH (ref 4.0–10.5)
nRBC: 0 % (ref 0.0–0.2)

## 2024-07-09 LAB — LIPASE, BLOOD: Lipase: 11 U/L (ref 11–51)

## 2024-07-09 MED ORDER — POLYETHYLENE GLYCOL 3350 17 G PO PACK: 17.0000 g | PACK | Freq: Three times a day (TID) | ORAL | 0 refills | Status: AC

## 2024-07-09 MED ORDER — AMOXICILLIN-POT CLAVULANATE 875-125 MG PO TABS: 1.0000 | ORAL_TABLET | Freq: Two times a day (BID) | ORAL | 0 refills | Status: AC

## 2024-07-09 MED ORDER — AMOXICILLIN-POT CLAVULANATE 875-125 MG PO TABS
1.0000 | ORAL_TABLET | Freq: Once | ORAL | Status: AC
  Administered 2024-07-09: 1 via ORAL
  Filled 2024-07-09: qty 1

## 2024-07-09 NOTE — ED Triage Notes (Signed)
 Pt reports constipation x 1 week. Pt repots taking ducolax at home with no relief. Pt reports some lower abd discomfort and pain and rectum

## 2024-07-09 NOTE — ED Provider Notes (Signed)
 "  [facility] Provider Note    Event Date/Time   First MD Initiated Contact with Patient 07/09/24 0245     (approximate)   History   Constipation   HPI  Becky Ward is a 69 y.o. female   Past medical history of chronic gastritis, colitis, hyperlipidemia, rheumatoid arthritis, here with constipation and left lower abdominal pain for the last several days.  She is passing gas but does have a history of appendectomy in the past.  No nausea or vomiting.  No fever.  No urinary symptoms.  Feels constipated has has not had a good stool in several days and is passing hard stools.   External Medical Documents Reviewed: Previous outpatient notes      Physical Exam   Triage Vital Signs: ED Triage Vitals  Encounter Vitals Group     BP 07/09/24 0132 (!) 126/98     Girls Systolic BP Percentile --      Girls Diastolic BP Percentile --      Boys Systolic BP Percentile --      Boys Diastolic BP Percentile --      Pulse Rate 07/09/24 0132 87     Resp 07/09/24 0132 17     Temp 07/09/24 0132 98.7 F (37.1 C)     Temp src --      SpO2 07/09/24 0132 100 %     Weight 07/09/24 0131 125 lb (56.7 kg)     Height 07/09/24 0131 5' 4 (1.626 m)     Head Circumference --      Peak Flow --      Pain Score 07/09/24 0131 5     Pain Loc --      Pain Education --      Exclude from Growth Chart --     Most recent vital signs: Vitals:   07/09/24 0132  BP: (!) 126/98  Pulse: 87  Resp: 17  Temp: 98.7 F (37.1 C)  SpO2: 100%    General: Awake, no distress.  CV:  Good peripheral perfusion.  Resp:  Normal effort.  Abd:  No distention.  Other:  Soft nonperitoneal abdomen but does have left lower quadrant tenderness to palpation.  Overall nontoxic-appearing, vital signs normal with no fever.   ED Results / Procedures / Treatments   Labs (all labs ordered are listed, but only abnormal results are displayed) Labs Reviewed  COMPREHENSIVE METABOLIC PANEL WITH GFR -  Abnormal; Notable for the following components:      Result Value   CO2 21 (*)    Glucose, Bld 113 (*)    Calcium  8.7 (*)    AST 60 (*)    ALT 55 (*)    All other components within normal limits  CBC - Abnormal; Notable for the following components:   WBC 14.5 (*)    RBC 3.67 (*)    MCV 100.3 (*)    RDW 15.9 (*)    Platelets 501 (*)    All other components within normal limits  LIPASE, BLOOD     I ordered and reviewed the above labs they are notable for leukocytosis 14.5.    RADIOLOGY I independently reviewed and interpreted scattered stool throughout the colon and no large rectal stool ball. I also reviewed radiologist's formal read.   PROCEDURES:  Critical Care performed: No  Procedures   MEDICATIONS ORDERED IN ED: Medications  amoxicillin -clavulanate (AUGMENTIN ) 875-125 MG per tablet 1 tablet (1 tablet Oral Given 07/09/24 0445)  IMPRESSION / MDM / ASSESSMENT AND PLAN / ED COURSE  I reviewed the triage vital signs and the nursing notes.                                Patient's presentation is most consistent with acute presentation with potential threat to life or bodily function.  Differential diagnosis includes, but is not limited to, constipation, obstruction, intra-abdominal infection like diverticulitis, perforated bowel    MDM:    Constipation left lower quadrant pain this 69 year old patient with left lower quadrant tenderness.  Indeed there were evidence of constipation with stool throughout the colon on CT scan but also evidence of uncomplicated diverticulitis.  Given the severity of pain, age and comorbidities will dose with oral antibiotic Augmentin  first dose in the emergency department prescription for the same.  Will give MiraLAX  bowel regimen for her constipation.  I considered hospitalization for admission or observation however given no complicating factors on the CT of her abdomen and pelvis, I think outpatient management at this time for  both her constipation diverticulitis is most appropriate.  She will follow-up with her PMD and return with any new or worsening symptoms.        FINAL CLINICAL IMPRESSION(S) / ED DIAGNOSES   Final diagnoses:  Constipation, unspecified constipation type  Diverticulitis     Rx / DC Orders   ED Discharge Orders          Ordered    amoxicillin -clavulanate (AUGMENTIN ) 875-125 MG tablet  2 times daily        07/09/24 0433    polyethylene glycol (MIRALAX ) 17 g packet  3 times daily        07/09/24 0433             Note:  This document was prepared using Dragon voice recognition software and may include unintentional dictation errors.    Cyrena Mylar, MD 07/09/24 [phone]  "

## 2024-07-09 NOTE — Discharge Instructions (Addendum)
 Your evaluation in the emergency department showed 2 problems today, the first is diverticulitis which is inflammation of the large bowels that can cause abdominal pain.  For this condition take Augmentin  antibiotic for the full 5-day course as prescribed.  Secondly, you are constipated.  There is stool that is scattered throughout your bowels that can also cause your abdominal discomfort.  For this condition, take MiraLAX  3 times daily until you are making regular bowel movements.  After that you may take 1 capful daily to help keep you regular.  Take acetaminophen  650 mg and ibuprofen 400 mg every 6 hours for pain.  Take with food.   Thank you for choosing us  for your health care today!  Please see your primary doctor this week for a follow up appointment.   If you have any new, worsening, or unexpected symptoms call your doctor right away or come back to the emergency department for reevaluation.  It was my pleasure to care for you today.   Ginnie EDISON Cyrena, MD

## 2024-10-20 ENCOUNTER — Ambulatory Visit: Admitting: Podiatry
